# Patient Record
Sex: Female | Born: 1950
Health system: Southern US, Community
[De-identification: ages and names within clinical notes are randomized; demographics above are authoritative.]

## PROBLEM LIST (undated history)

## (undated) DIAGNOSIS — R112 Nausea with vomiting, unspecified: Secondary | ICD-10-CM

## (undated) DIAGNOSIS — Z9889 Other specified postprocedural states: Secondary | ICD-10-CM

## (undated) DIAGNOSIS — K589 Irritable bowel syndrome without diarrhea: Secondary | ICD-10-CM

## (undated) DIAGNOSIS — T8859XA Other complications of anesthesia, initial encounter: Secondary | ICD-10-CM

## (undated) DIAGNOSIS — E119 Type 2 diabetes mellitus without complications: Secondary | ICD-10-CM

## (undated) DIAGNOSIS — E039 Hypothyroidism, unspecified: Secondary | ICD-10-CM

## (undated) DIAGNOSIS — T4145XA Adverse effect of unspecified anesthetic, initial encounter: Secondary | ICD-10-CM

## (undated) DIAGNOSIS — K5790 Diverticulosis of intestine, part unspecified, without perforation or abscess without bleeding: Secondary | ICD-10-CM

## (undated) DIAGNOSIS — I10 Essential (primary) hypertension: Secondary | ICD-10-CM

## (undated) DIAGNOSIS — E669 Obesity, unspecified: Secondary | ICD-10-CM

## (undated) DIAGNOSIS — Z8489 Family history of other specified conditions: Secondary | ICD-10-CM

## (undated) HISTORY — PX: WISDOM TOOTH EXTRACTION: SHX21

## (undated) HISTORY — PX: HYSTEROSCOPY W/ ENDOMETRIAL ABLATION: SUR665

## (undated) HISTORY — PX: TUBAL LIGATION: SHX77

---
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
## 2013-12-06 ENCOUNTER — Emergency Department (HOSPITAL_COMMUNITY)
Admission: EM | Admit: 2013-12-06 | Discharge: 2013-12-06 | Disposition: A | Discharge status: Home / Self Care | Payer: 59 | Attending: Emergency Medicine | Admitting: Emergency Medicine

## 2013-12-06 ENCOUNTER — Encounter (HOSPITAL_COMMUNITY): Payer: Self-pay | Admitting: Emergency Medicine

## 2013-12-06 DIAGNOSIS — E669 Obesity, unspecified: Secondary | ICD-10-CM | POA: Insufficient documentation

## 2013-12-06 DIAGNOSIS — J029 Acute pharyngitis, unspecified: Secondary | ICD-10-CM

## 2013-12-06 DIAGNOSIS — J069 Acute upper respiratory infection, unspecified: Secondary | ICD-10-CM | POA: Insufficient documentation

## 2013-12-06 HISTORY — DX: Obesity, unspecified: E66.9

## 2013-12-06 MED ORDER — AZITHROMYCIN 250 MG PO TABS: ORAL_TABLET | ORAL | Status: DC

## 2013-12-06 MED ORDER — LIDOCAINE VISCOUS HCL 2 % MT SOLN: 5.0000 mL | Freq: Four times a day (QID) | OROMUCOSAL | Status: DC | PRN

## 2013-12-06 NOTE — ED Notes (Signed)
Pt reports sore throat x 3 weeks, no fever or chills. Airway intact.

## 2013-12-06 NOTE — ED Provider Notes (Signed)
CSN: [id number]     Arrival date & time 12/06/13  1232 History  This chart was scribed for non-physician practitioner, Margarita Mail, PA-C, working with Tanna Furry, MD by Roe Coombs, ED Scribe. This patient was seen in room TR04C/TR04C and the patient's care was started at 1:23 PM.   Chief Complaint  Patient presents with  . Sore Throat    The history is provided by the patient. No language interpreter was used.    HPI Comments: Laurie Michael is a 63 y.o. female who presents to the Emergency Department complaining of constant, moderate sore throat onset 3 weeks ago. Pain is worse with swallowing. Patient states that prior to developing a sore throat, she was having cold like symptoms to include productive cough with green sputum. She states that symptoms improved for about 1 week when she she developed throat pain and cough returned. She denies fever, chills, nausea or vomiting. She has sick contacts of her husband and son who has an ear infection. She does not take any medications on a regular basis. Patient does not smoke.  Past Medical History  Diagnosis Date  . Obesity    History reviewed. No pertinent past surgical history. History reviewed. No pertinent family history. History  Substance Use Topics  . Smoking status: Never Smoker   . Smokeless tobacco: Not on file  . Alcohol Use: No   OB History   Grav Para Term Preterm Abortions TAB SAB Ect Mult Living                 Review of Systems  Constitutional: Negative for fever and chills.  HENT: Positive for sore throat.   Respiratory: Positive for cough.   Gastrointestinal: Negative for nausea and vomiting.  Neurological: Negative for headaches.    Allergies  Review of patient's allergies indicates no known allergies.  Home Medications   Prior to Admission medications   Not on File   Triage Vitals: BP 138/94  Pulse 101  Temp(Src) 97.8 F (36.6 C) (Oral)  Resp 18  Wt 214 lb 11.2 oz (97.387 kg)  SpO2  96% Physical Exam  Constitutional: She is oriented to person, place, and time. She appears well-developed and well-nourished. No distress.  HENT:  Head: Normocephalic and atraumatic.  Mouth/Throat: Posterior oropharyngeal erythema present. No oropharyngeal exudate or posterior oropharyngeal edema.  No gross oropharyngeal swelling.  Eyes: Conjunctivae and EOM are normal.  Neck: Neck supple.  Cardiovascular: Normal rate, regular rhythm and normal heart sounds.   No murmur heard. Pulmonary/Chest: Effort normal and breath sounds normal. No respiratory distress. She has no wheezes. She has no rales.  Musculoskeletal: Normal range of motion.  Lymphadenopathy:    She has cervical adenopathy (bilateral).  Neurological: She is alert and oriented to person, place, and time.  Skin: Skin is warm and dry.  Psychiatric: She has a normal mood and affect. Her behavior is normal.    ED Course  Procedures (including critical care time) DIAGNOSTIC STUDIES: Oxygen Saturation is 96% on room air, adequate by my interpretation.    COORDINATION OF CARE: 1:26 PM- Patient informed of current plan for treatment and evaluation and agrees with plan at this time.    MDM   Final diagnoses:  URI (upper respiratory infection)    Patient presents with sore throat and recurrent cough. Oropharyngeal erythema without exudate or gross swelling on exam. Lungs clear to auscultation. No indication for imaging. Will treat for upper respiratory infection with azithromycin and viscous lidocaine as patient  had these symptoms and has a secondary recurrence after getting better. Verbalizes understanding and is agreeable with plan. Pt is hemodynamically stable & in NAD prior to dc.  I personally performed the services described in this documentation, which was scribed in my presence. The recorded information has been reviewed and considered.  Margarita Mail, PA-C 12/07/13 1819

## 2013-12-06 NOTE — Discharge Instructions (Signed)
Pharyngitis Pharyngitis is redness, pain, and swelling (inflammation) of your pharynx.  CAUSES  Pharyngitis is usually caused by infection. Most of the time, these infections are from viruses (viral) and are part of a cold. However, sometimes pharyngitis is caused by bacteria (bacterial). Pharyngitis can also be caused by allergies. Viral pharyngitis may be spread from person to person by coughing, sneezing, and personal items or utensils (cups, forks, spoons, toothbrushes). Bacterial pharyngitis may be spread from person to person by more intimate contact, such as kissing.  SIGNS AND SYMPTOMS  Symptoms of pharyngitis include:   Sore throat.   Tiredness (fatigue).   Low-grade fever.   Headache.  Joint pain and muscle aches.  Skin rashes.  Swollen lymph nodes.  Plaque-like film on throat or tonsils (often seen with bacterial pharyngitis). DIAGNOSIS  Your health care provider will ask you questions about your illness and your symptoms. Your medical history, along with a physical exam, is often all that is needed to diagnose pharyngitis. Sometimes, a rapid strep test is done. Other lab tests may also be done, depending on the suspected cause.  TREATMENT  Viral pharyngitis will usually get better in 3 4 days without the use of medicine. Bacterial pharyngitis is treated with medicines that kill germs (antibiotics).  HOME CARE INSTRUCTIONS   Drink enough water and fluids to keep your urine clear or pale yellow.   Only take over-the-counter or prescription medicines as directed by your health care provider:   If you are prescribed antibiotics, make sure you finish them even if you start to feel better.   Do not take aspirin.   Get lots of rest.   Gargle with 8 oz of salt water ( tsp of salt per 1 qt of water) as often as every 1 2 hours to soothe your throat.   Throat lozenges (if you are not at risk for choking) or sprays may be used to soothe your throat. SEEK MEDICAL  CARE IF:   You have large, tender lumps in your neck.  You have a rash.  You cough up green, yellow-brown, or bloody spit. SEEK IMMEDIATE MEDICAL CARE IF:   Your neck becomes stiff.  You drool or are unable to swallow liquids.  You vomit or are unable to keep medicines or liquids down.  You have severe pain that does not go away with the use of recommended medicines.  You have trouble breathing (not caused by a stuffy nose). MAKE SURE YOU:   Understand these instructions.  Will watch your condition.  Will get help right away if you are not doing well or get worse. Document Released: 07/31/2005 Document Revised: 05/21/2013 Document Reviewed: 04/07/2013 [facility] Patient Information 2014 [place].  Cough, Adult  A cough is a reflex that helps clear your throat and airways. It can help heal the body or may be a reaction to an irritated airway. A cough may only last 2 or 3 weeks (acute) or may last more than 8 weeks (chronic).  CAUSES Acute cough:  Viral or bacterial infections. Chronic cough:  Infections.  Allergies.  Asthma.  Post-nasal drip.  Smoking.  Heartburn or acid reflux.  Some medicines.  Chronic lung problems (COPD).  Cancer. SYMPTOMS   Cough.  Fever.  Chest pain.  Increased breathing rate.  High-pitched whistling sound when breathing (wheezing).  Colored mucus that you cough up (sputum). TREATMENT   A bacterial cough may be treated with antibiotic medicine.  A viral cough must run its course and will not  respond to antibiotics.  Your caregiver may recommend other treatments if you have a chronic cough. HOME CARE INSTRUCTIONS   Only take over-the-counter or prescription medicines for pain, discomfort, or fever as directed by your caregiver. Use cough suppressants only as directed by your caregiver.  Use a cold steam vaporizer or humidifier in your bedroom or home to help loosen secretions.  Sleep in a semi-upright position if  your cough is worse at night.  Rest as needed.  Stop smoking if you smoke. SEEK IMMEDIATE MEDICAL CARE IF:   You have pus in your sputum.  Your cough starts to worsen.  You cannot control your cough with suppressants and are losing sleep.  You begin coughing up blood.  You have difficulty breathing.  You develop pain which is getting worse or is uncontrolled with medicine.  You have a fever. MAKE SURE YOU:   Understand these instructions.  Will watch your condition.  Will get help right away if you are not doing well or get worse. Document Released: 01/27/2011 Document Revised: 10/23/2011 Document Reviewed: 01/27/2011 [facility] Patient Information 2014 [place].

## 2013-12-23 NOTE — ED Provider Notes (Signed)
Medical screening examination/treatment/procedure(s) were performed by non-physician practitioner and as supervising physician I was immediately available for consultation/collaboration.   EKG Interpretation None        Tanna Furry, MD 12/23/13 1906

## 2015-05-31 LAB — HM DIABETES EYE EXAM

## 2015-08-15 HISTORY — PX: CATARACT EXTRACTION, BILATERAL: SHX1313

## 2016-01-12 ENCOUNTER — Ambulatory Visit (INDEPENDENT_AMBULATORY_CARE_PROVIDER_SITE_OTHER): Payer: PRIVATE HEALTH INSURANCE | Admitting: Internal Medicine

## 2016-01-12 ENCOUNTER — Ambulatory Visit: Payer: PRIVATE HEALTH INSURANCE

## 2016-01-12 ENCOUNTER — Encounter: Payer: Self-pay | Admitting: Internal Medicine

## 2016-01-12 VITALS — BP 150/80 | HR 76 | Temp 98.2°F | Ht 63.0 in | Wt 203.7 lb

## 2016-01-12 DIAGNOSIS — Z7984 Long term (current) use of oral hypoglycemic drugs: Secondary | ICD-10-CM | POA: Diagnosis not present

## 2016-01-12 DIAGNOSIS — Z0000 Encounter for general adult medical examination without abnormal findings: Secondary | ICD-10-CM | POA: Insufficient documentation

## 2016-01-12 DIAGNOSIS — I10 Essential (primary) hypertension: Secondary | ICD-10-CM | POA: Diagnosis not present

## 2016-01-12 DIAGNOSIS — E1136 Type 2 diabetes mellitus with diabetic cataract: Secondary | ICD-10-CM

## 2016-01-12 DIAGNOSIS — E1165 Type 2 diabetes mellitus with hyperglycemia: Secondary | ICD-10-CM

## 2016-01-12 DIAGNOSIS — E119 Type 2 diabetes mellitus without complications: Secondary | ICD-10-CM | POA: Insufficient documentation

## 2016-01-12 DIAGNOSIS — K580 Irritable bowel syndrome with diarrhea: Secondary | ICD-10-CM | POA: Diagnosis not present

## 2016-01-12 DIAGNOSIS — Z87891 Personal history of nicotine dependence: Secondary | ICD-10-CM

## 2016-01-12 DIAGNOSIS — R739 Hyperglycemia, unspecified: Secondary | ICD-10-CM

## 2016-01-12 DIAGNOSIS — H269 Unspecified cataract: Secondary | ICD-10-CM | POA: Insufficient documentation

## 2016-01-12 DIAGNOSIS — R197 Diarrhea, unspecified: Secondary | ICD-10-CM | POA: Insufficient documentation

## 2016-01-12 LAB — POCT GLYCOSYLATED HEMOGLOBIN (HGB A1C): HEMOGLOBIN A1C: 8.2

## 2016-01-12 LAB — GLUCOSE, CAPILLARY: GLUCOSE-CAPILLARY: 192 mg/dL — AB (ref 65–99)

## 2016-01-12 MED ORDER — HYDROCHLOROTHIAZIDE 12.5 MG PO CAPS: 12.5000 mg | ORAL_CAPSULE | Freq: Every day | ORAL | Status: DC

## 2016-01-12 MED ORDER — DICYCLOMINE HCL 20 MG PO TABS
20.0000 mg | ORAL_TABLET | Freq: Three times a day (TID) | ORAL | Status: DC | PRN
Start: 2016-01-12 — End: 2017-07-17

## 2016-01-12 MED ORDER — METFORMIN HCL 500 MG PO TABS
500.0000 mg | ORAL_TABLET | Freq: Two times a day (BID) | ORAL | Status: DC
Start: 2016-01-12 — End: 2016-02-11

## 2016-01-12 NOTE — Assessment & Plan Note (Signed)
A: Readings at home an in office today c/w essential HTN.   P:  Start HCTZ 12.5 mg daily F/u 1 month BMET in 1 month

## 2016-01-12 NOTE — Progress Notes (Signed)
   Subjective:    Patient ID: Laurie Michael, female    DOB: 04-25-1951, 65 y.o.   MRN: [id number]  HPI  [name] is a 65 year old woman with a PMH of obesity, prior tobacco abuse, and cataracts who comes to the clinic to discuss blurry vision. She's had blurry vision since October 2016 when she was diagnosed with cataracts at Dr. Zenia Resides ophthalmology office. She says she does not feel her vision is narrowing or that she is going blind. She denies any headaches.   The patient also describes polyphagia and polydipsia, denying polyuria. She took her blood glucose first thing in the morning before eating and it was 170. She used her husband's glucometer. She also uses her husband's blood pressure cuff and says she is regularly in the 150s "for the top number."  The patient also complains of longstanding stool urgency and diarrhea, feeling as though she needs to be near the bathroom "just in case" after dinner. She denies any blood in her stool. She refers to this as "IBS."  She is a former smoker of >20 years, quitting several years ago. Her father his deceased from an MI. Her mother has diabetes.  Review of Systems  Constitutional: Negative for fever, fatigue and unexpected weight change.  HENT: Negative for congestion and sore throat.   Eyes: Positive for visual disturbance. Negative for photophobia, redness and itching.  Respiratory: Negative for cough and shortness of breath.   Cardiovascular: Negative for chest pain and palpitations.  Gastrointestinal: Negative for abdominal pain and diarrhea.  Endocrine: Positive for polydipsia and polyphagia. Negative for polyuria.  Genitourinary: Negative for urgency and frequency.  Musculoskeletal: Negative for back pain and arthralgias.  Skin: Negative for rash and wound.  Neurological: Negative for dizziness, light-headedness and headaches.  Psychiatric/Behavioral: Negative for dysphoric mood. The patient is not nervous/anxious.          Objective:   Physical Exam  Constitutional: No distress.  HENT:  Mouth/Throat: Oropharynx is clear and moist. No oropharyngeal exudate.  Eyes: Conjunctivae are normal. No scleral icterus.  Cataracts present.  Neck: Neck supple.  Cardiovascular: Normal rate, regular rhythm and normal heart sounds.   Pulmonary/Chest: Effort normal and breath sounds normal. No respiratory distress. She has no wheezes.  Abdominal: Soft. Bowel sounds are normal. She exhibits no distension. There is no tenderness.  Obese  Musculoskeletal: She exhibits no edema or tenderness.  Lymphadenopathy:    She has no cervical adenopathy.  Neurological: She is alert. She has normal reflexes.  Skin: Skin is warm and dry. She is not diaphoretic.  Psychiatric: She has a normal mood and affect. Her behavior is normal.  Vitals reviewed.         Assessment & Plan:   Please see problem based assessment and plan for details.

## 2016-01-12 NOTE — Assessment & Plan Note (Addendum)
A: Cataracts on exam today. Blurry vision is likely attributable to this rather than diabetes, which is relative mild.  P:  Ophthalmology referral

## 2016-01-12 NOTE — Assessment & Plan Note (Signed)
Stool cards provided today

## 2016-01-12 NOTE — Assessment & Plan Note (Signed)
A: While technically a diagnosis of exclusion, her years of relatively mild symptoms without any clear extra-diarrheal manifestations or bloody stool fits the IBS diagnosis well.  P: Start Dicyclomine 20 mg TID prn stool urgency

## 2016-01-12 NOTE — Progress Notes (Signed)
Medicine attending: Medical history, presenting problems, physical findings, and medications, reviewed with resident physician Dr  Jeremy Ford on the day of the patient visit and I concur with his evaluation and management plan. 

## 2016-01-12 NOTE — Patient Instructions (Signed)
Laurie Michael,  It was a joy to meet you today.  I am sorry to report that you have diabetes. I think you can do a good job beating this just like you beat smoking. I have made a referral to our diabetes educator. I have also started you on metformin 500 mg twice daily. One side effect can be stomach discomfort, but this should go away. You may lose weight on this medication.   You also have high blood pressure. We are starting hydrochlorothiazide (HCTZ) 12.5 mg daily for this.  For your IBS, we are starting dicyclomine (Bentyl) 20 mg three times daily as needed for your symptoms. If this is too expensive, you can try Immodium (loperamide) 2 mg 45 minutes before meals.  For you blurry vision, this is most likely due to cataracts. We have made a referral to an eye doctor.  Have a lovely day!  We'll see you in a month.  Alogliptin; Metformin oral tablets What is this medicine? ALOGLIPTIN; METFORMIN (al oh GLIP tin; met FOR min) is a combination of 2 medicines used to treat type 2 diabetes. This medicine lowers blood sugar. Treatment is combined with a balanced diet and exercise. This medicine may be used for other purposes; ask your health care provider or pharmacist if you have questions. What should I tell my health care provider before I take this medicine? They need to know if you have any of these conditions: -anemia -become easily dehydrated -diabetic ketoacidosis -heart disease -heart failure -history of alcohol abuse problem -if you often drink alcohol -kidney disease -liver disease -low levels of vitamin B12 in the blood -older than 80 years -pancreatitis -polycystic ovary syndrome -previous swelling of the tongue, face, or lips with difficulty breathing, difficulty swallowing, hoarseness, or tightening of the throat -serious infection or injury -thyroid disease -type 1 diabetes -undergoing surgery or certain procedures with injectable contrast agents -an unusual or  allergic reaction to alogliptin, metformin, other medicines, foods, dyes, or preservatives -pregnant or trying to get pregnant -breast-feeding How should I use this medicine? Take this medicine by mouth with a glass of water. Take this medicine with food. Do not cut this medicine. Follow the directions on the prescription label. Take your doses at regular intervals. Do not take your medicine more often than directed. Do not stop taking except on your doctor's advice. Talk to your pediatrician regarding the use of this medicine in children. Special care may be needed. Overdosage: If you think you have taken too much of this medicine contact a poison control center or emergency room at once. NOTE: This medicine is only for you. Do not share this medicine with others. What if I miss a dose? If you miss a dose, take it as soon as you can. If it is almost time for your next dose, take only that dose. Do not take double or extra doses. What may interact with this medicine? Do not take this medicine with any of the following medications: -certain contrast medicines given before X-rays, CT scans, MRI, or other procedures -dofetilide -gatifloxacin This medicine may also interact with the following medications: -acetazolamide -alcohol -amiloride -certain antiviral medicines for HIV infection or hepatitis -cimetidine -crizotinib -digoxin -diuretics -female hormones, like estrogens or progestins and birth control pills -glycopyrrolate -isoniazid -lamotrigine -medicines for blood pressure, heart disease, irregular heart beat -memantine -methazolamide -midodrine -morphine -nicotinic acid -phenothiazines like chlorpromazine, mesoridazine, prochlorperazine, thioridazine -phenytoin -procainamide -propantheline -quinidine -quinine -ranitidine -ranolazine -steroid medicines like prednisone or cortisone -stimulant medicines for  attention disorders, weight loss, or to stay awake -thyroid  medicines -topiramate -trimethoprim -trospium -vancomycin -vandetanib -zonisamide This list may not describe all possible interactions. Give your health care provider a list of all the medicines, herbs, non-prescription drugs, or dietary supplements you use. Also tell them if you smoke, drink alcohol, or use illegal drugs. Some items may interact with your medicine. What should I watch for while using this medicine? Visit your doctor or health care professional for regular checks on your progress. A test called the HbA1C (A1C) will be monitored. This is a simple blood test. It measures your blood sugar control over the last 2 to 3 months. You will receive this test every 3 to 6 months. Learn how to check your blood sugar. Learn the symptoms of low and high blood sugar and how to manage them. Always carry a quick-source of sugar with you in case you have symptoms of low blood sugar. Examples include hard sugar candy or glucose tablets. Make sure others know that you can choke if you eat or drink when you develop serious symptoms of low blood sugar, such as seizures or unconsciousness. They must get medical help at once. Tell your doctor or health care professional if you have high blood sugar. You might need to change the dose of your medicine. If you are sick or exercising more than usual, you might need to change the dose of your medicine. Do not skip meals. Ask your doctor or health care professional if you should avoid alcohol. Many nonprescription cough and cold products contain sugar or alcohol. These can affect blood sugar. This medicine may cause ovulation in premenopausal women who do not have regular monthly periods. This may increase your chances of becoming pregnant. You should not take this medicine if you become pregnant or think you may be pregnant. Talk with your doctor or health care professional about your birth control options while taking this medicine. Contact your doctor or health  care professional right away if think you are pregnant. If you are going to need surgery, a MRI, CT scan, or other procedure, tell your doctor that you are taking this medicine. You may need to stop taking this medicine before the procedure. Wear a medical ID bracelet or chain, and carry a card that describes your disease and details of your medicine and dosage times. What side effects may I notice from receiving this medicine? Side effects that you should report to your doctor or health care professional as soon as possible: -allergic reactions like skin rash, itching or hives, swelling of the face, lips, or tongue -breathing problems -dark urine -general ill feeling or flu-like symptoms -joint pain -light-colored stools -loss of appetite -muscle pain -nausea, vomiting -right upper belly pain -signs and symptoms of low blood sugar such as feeling anxious, confusion, dizziness, increased hunger, unusually weak or tired, sweating, shakiness, cold, irritable, headache, blurred vision, fast heartbeat, loss of consciousness -slow or irregular heartbeat -unusual stomach upset or pain -yellowing of the eyes or skin Side effects that usually do not require medical attention (Report these to your doctor or health care professional if they continue or are bothersome.): -diarrhea -headache -heartburn -metallic taste in the mouth -stomach gas, upset -stuffy or runny nose This list may not describe all possible side effects. Call your doctor for medical advice about side effects. You may report side effects to FDA at 1-800-FDA-1088. Where should I keep my medicine? Keep out of the reach of children. Store at room temperature  between 20 and 25 degrees C (68 and 77 degrees F). Throw away any unused medicine after the expiration date. NOTE: This sheet is a summary. It may not cover all possible information. If you have questions about this medicine, talk to your doctor, pharmacist, or health care  provider.    2016, [employer]. (2014-11-19 10:33:18)

## 2016-01-12 NOTE — Assessment & Plan Note (Signed)
A: A1c of 8.2 fits diagnosis of T2DM. She has already initiated some lifestyle changes with minimal effect.   P:  Metformin 500 mg BID Referral to dietician

## 2016-01-13 LAB — HM DIABETES EYE EXAM

## 2016-01-17 ENCOUNTER — Encounter: Payer: Self-pay | Admitting: *Deleted

## 2016-01-25 ENCOUNTER — Ambulatory Visit: Payer: Self-pay

## 2016-01-26 ENCOUNTER — Ambulatory Visit: Payer: Self-pay

## 2016-02-11 ENCOUNTER — Ambulatory Visit (INDEPENDENT_AMBULATORY_CARE_PROVIDER_SITE_OTHER): Payer: Self-pay | Admitting: Dietician

## 2016-02-11 ENCOUNTER — Ambulatory Visit (INDEPENDENT_AMBULATORY_CARE_PROVIDER_SITE_OTHER): Payer: Self-pay | Admitting: Internal Medicine

## 2016-02-11 ENCOUNTER — Encounter: Payer: Self-pay | Admitting: Dietician

## 2016-02-11 ENCOUNTER — Encounter: Payer: Self-pay | Admitting: Internal Medicine

## 2016-02-11 VITALS — BP 127/76 | HR 79 | Temp 97.7°F | Ht 63.0 in | Wt 200.2 lb

## 2016-02-11 DIAGNOSIS — Z7984 Long term (current) use of oral hypoglycemic drugs: Secondary | ICD-10-CM

## 2016-02-11 DIAGNOSIS — E119 Type 2 diabetes mellitus without complications: Secondary | ICD-10-CM

## 2016-02-11 DIAGNOSIS — E1165 Type 2 diabetes mellitus with hyperglycemia: Principal | ICD-10-CM

## 2016-02-11 DIAGNOSIS — K529 Noninfective gastroenteritis and colitis, unspecified: Secondary | ICD-10-CM

## 2016-02-11 DIAGNOSIS — R197 Diarrhea, unspecified: Secondary | ICD-10-CM

## 2016-02-11 DIAGNOSIS — E1139 Type 2 diabetes mellitus with other diabetic ophthalmic complication: Secondary | ICD-10-CM

## 2016-02-11 DIAGNOSIS — Z713 Dietary counseling and surveillance: Secondary | ICD-10-CM

## 2016-02-11 DIAGNOSIS — E1136 Type 2 diabetes mellitus with diabetic cataract: Secondary | ICD-10-CM

## 2016-02-11 DIAGNOSIS — I10 Essential (primary) hypertension: Secondary | ICD-10-CM

## 2016-02-11 DIAGNOSIS — IMO0002 Reserved for concepts with insufficient information to code with codable children: Secondary | ICD-10-CM

## 2016-02-11 LAB — GLUCOSE, CAPILLARY: Glucose-Capillary: 113 mg/dL — ABNORMAL HIGH (ref 65–99)

## 2016-02-11 MED ORDER — METFORMIN HCL 1000 MG PO TABS: 1000.0000 mg | ORAL_TABLET | Freq: Two times a day (BID) | ORAL | Status: DC

## 2016-02-11 NOTE — Patient Instructions (Signed)
Dear Mr. & Mrs. Tuckey,  I would like you to schedule a follow up appointment in about 2-3 months to see how you are doing with planning your meals. Or sooner if needed.   Of course, call anytime you have questions or concerns   Sincerely,  Debera Lat [phone]

## 2016-02-11 NOTE — Assessment & Plan Note (Signed)
BP Readings from Last 3 Encounters:  02/11/16 127/76  01/12/16 150/80  12/06/13 131/89    No results found for: NA, K, CREATININE  Assessment: Blood pressure control:  Controlled Comments: on 12.5mg  daily of HCTZ  Plan: Medications:  continue current medications  Bmet, next visit.

## 2016-02-11 NOTE — Patient Instructions (Addendum)
You will need to get a Colonoscopy, PAP smear, and stool test, when you get your [facility] card.   Please take your medication everyday. Since you are doing well with the metformin, we will increase the dose to 1000mg  two times a day. We have sent in the new prescription.   If you have any problems with affordability, let us know.   It was nice seeing you today.

## 2016-02-11 NOTE — Assessment & Plan Note (Signed)
She is been referred to [facility], as she does not have the [facility] card yet. Appointment with Dr Katy Fitch cancelled.

## 2016-02-11 NOTE — Assessment & Plan Note (Addendum)
Chronic diarrhea for many years. She has never had a colonoscopy, or evaluation for this diarrhea. Diarhea occurs after every meal. No blood. No vomiting. Weight down 14 Lbs since 2015.  She was prescribed dicyclomine but she did not take it, as she was worried she will have cramps.   Differentials- Secretory vs Osmotic diarrhea, Microscopic colitis, or IBS.  Plan- Would recommend stool workup- Giardia, Stool Na and K, and check Electrolytes and Cr next visit, with [facility] card - She is also due for a colonoscopy anyway, Place refferal once she gets the [facility] card.

## 2016-02-11 NOTE — Assessment & Plan Note (Addendum)
Tolerating 500mg  BID of metformin. There has been no change in the frequency of her diarrhea. No nausea or vomiting  Plan- Increase metformin to 1000mg  BID - Urine micro, foot exam, lipid panel once she gets [facility] card.

## 2016-02-11 NOTE — Progress Notes (Signed)
Diabetes Self-Management Education  Visit Type: First/Initial  Appt. Start Time: 1530 Appt. End Time: [id number]  02/11/2016  [name], identified by name and date of birth, is a 65 y.o. female with a diagnosis of Diabetes: Type 2.   ASSESSMENT  She is here with her husband who also has diabetes.He is very supportive. He wants them to adopt healthier meals. She wants him to exercsie.  She exercises on treadmill a few days a week. The both quit smoking in the recent past  BMI.-35 class 1 obesity with central abdominal obesity      Diabetes Self-Management Education - 02/11/16 1600    Visit Information   Visit Type First/Initial   Initial Visit   Diabetes Type Type 2   Are you currently following a meal plan? No   Are you taking your medications as prescribed? Yes   Date Diagnosed --  2017   Health Coping   How would you rate your overall health? Good   Psychosocial Assessment   Patient Belief/Attitude about Diabetes Motivated to manage diabetes   Self-care barriers Lack of material resources   Self-management support Doctor's office;Family;CDE visits   Other persons present Spouse/SO   Patient Concerns Nutrition/Meal planning   Special Needs None   Preferred Learning Style No preference indicated   Learning Readiness Ready   How often do you need to have someone help you when you read instructions, pamphlets, or other written materials from your doctor or pharmacy? 2 - Rarely   Pre-Education Assessment   Patient understands the diabetes disease and treatment process. Demonstrates understanding / competency   Patient understands incorporating nutritional management into lifestyle. Needs Instruction   Patient undertands incorporating physical activity into lifestyle. Demonstrates understanding / competency   Patient understands using medications safely. Demonstrates understanding / competency   Patient understands how to develop strategies to promote health/change  behavior. Needs Instruction   Complications   Last HgB A1C per patient/outside source 8.2 %   Dietary Intake   Lunch salad, fried and grilled chicken   Dinner regular soda   Snack (evening) peanuts   Patient Education   Previous Diabetes Education Yes (at his doctor's office)   Nutrition management  Role of diet in the treatment of diabetes and the relationship between the three main macronutrients and blood glucose level;Food label reading, portion sizes and measuring food.;Carbohydrate counting   Personal strategies to promote health Helped patient develop diabetes management plan for better nutrition and meal plan with 45-60 grams carb/meal   Individualized Goals (developed by patient)   Nutrition Follow meal plan discussed   Outcomes   Expected Outcomes Demonstrated interest in learning. Expect positive outcomes   Future DMSE 3-4 months   Program Status Completed      Individualized Plan for Diabetes Self-Management Training:   Learning Objective:  Patient will have a greater understanding of diabetes self-management. Patient education plan: nutrition meal planning and developing habits to promote health   Plan:   There are no Patient Instructions on file for this visit.  Expected Outcomes:  Demonstrated interest in learning. Expect positive outcomes Education material provided: Living Well with Diabetes, My Plate and Snack sheet If problems or questions, patient to contact team via:  Phone  Future DSME appointment: 3-4 months

## 2016-02-11 NOTE — Progress Notes (Signed)
Patient ID: Laurie Michael, female   DOB: August 25, 1950, 65 y.o.   MRN: [id number]   CC:  HPI: [name] is a 65 y.o. with PMH listed below. She came in today to follow up on her DM and HTN. She was started on new meds- 01/12/16. She has no complainst today.  Past Medical History  Diagnosis Date  . Obesity    Review of Systems: CONSTITUTIONAL- No Fever, or change in appetite. SKIN- No Rash, colour changes or itching. HEAD- No Headache or dizziness. Mouth/throat- No Sorethroat, dentures, or bleeding gums. RESPIRATORY- No Cough or SOB. CARDIAC- No Palpitations, or chest pain. GI- has chronic diarrhea, no abd pain. URINARY- No Frequency, or dysuria.  Physical Exam: Filed Vitals:   02/11/16 1454  BP: 127/76  Pulse: 79  Temp: 97.7 F (36.5 C)  TempSrc: Oral  Height: 5\' 3"  (1.6 m)  Weight: 200 lb 3.2 oz (90.81 kg)  SpO2: 98%   GENERAL- alert, co-operative, appears as stated age, not in any distress. HEENT- Atraumatic, normocephalic, PERRL,  CARDIAC- RRR, no murmurs, rubs or gallops. RESP- Moving equal volumes of air, and no wheezes or crackles. ABDOMEN- Soft, nontender, obese, no guarding or rebound, bowel sounds present. NEURO- No obvious Cr N abnormality, strenght upper and lower extremities intact, Gait- Normal. EXTREMITIES- pulse 2+, symmetric, no pedal edema. SKIN- Warm, dry, No rash or lesion. PSYCH- Normal mood and affect, appropriate thought content and speech.  Assessment & Plan:  See encounters tab for problem based medical decision making. Patient discussed with Dr. Lynnae January

## 2016-02-14 NOTE — Progress Notes (Signed)
Internal Medicine Clinic Attending  Case discussed with Dr. Rosalva Ferron soon after the resident saw the patient.  We reviewed the resident's history and exam and pertinent patient test results.  I agree with the assessment, diagnosis, and plan of care documented in the resident's note.

## 2016-02-28 ENCOUNTER — Ambulatory Visit: Payer: Self-pay

## 2016-02-29 ENCOUNTER — Ambulatory Visit (INDEPENDENT_AMBULATORY_CARE_PROVIDER_SITE_OTHER): Payer: Self-pay | Admitting: Internal Medicine

## 2016-02-29 ENCOUNTER — Encounter: Payer: Self-pay | Admitting: Internal Medicine

## 2016-02-29 VITALS — BP 141/95 | HR 77 | Temp 98.0°F | Ht 63.0 in | Wt 197.4 lb

## 2016-02-29 DIAGNOSIS — Z7984 Long term (current) use of oral hypoglycemic drugs: Secondary | ICD-10-CM

## 2016-02-29 DIAGNOSIS — R197 Diarrhea, unspecified: Secondary | ICD-10-CM

## 2016-02-29 DIAGNOSIS — Z0000 Encounter for general adult medical examination without abnormal findings: Secondary | ICD-10-CM

## 2016-02-29 DIAGNOSIS — H269 Unspecified cataract: Secondary | ICD-10-CM

## 2016-02-29 DIAGNOSIS — E1139 Type 2 diabetes mellitus with other diabetic ophthalmic complication: Secondary | ICD-10-CM

## 2016-02-29 DIAGNOSIS — E1136 Type 2 diabetes mellitus with diabetic cataract: Secondary | ICD-10-CM

## 2016-02-29 DIAGNOSIS — I10 Essential (primary) hypertension: Secondary | ICD-10-CM

## 2016-02-29 DIAGNOSIS — K529 Noninfective gastroenteritis and colitis, unspecified: Secondary | ICD-10-CM

## 2016-02-29 DIAGNOSIS — E1165 Type 2 diabetes mellitus with hyperglycemia: Secondary | ICD-10-CM

## 2016-02-29 DIAGNOSIS — E119 Type 2 diabetes mellitus without complications: Secondary | ICD-10-CM

## 2016-02-29 DIAGNOSIS — IMO0002 Reserved for concepts with insufficient information to code with codable children: Secondary | ICD-10-CM

## 2016-02-29 LAB — BASIC METABOLIC PANEL
ANION GAP: 12 (ref 5–15)
BUN: 11 mg/dL (ref 6–20)
CALCIUM: 9.8 mg/dL (ref 8.9–10.3)
CO2: 28 mmol/L (ref 22–32)
CREATININE: 0.79 mg/dL (ref 0.44–1.00)
Chloride: 100 mmol/L — ABNORMAL LOW (ref 101–111)
GFR calc Af Amer: >60 mL/min (ref >60)
Glucose, Bld: 85 mg/dL (ref 65–99)
Potassium: 3.7 mmol/L (ref 3.5–5.1)
SODIUM: 140 mmol/L (ref 135–145)

## 2016-02-29 MED ORDER — GLIPIZIDE 5 MG PO TABS: 5.0000 mg | ORAL_TABLET | Freq: Every day | ORAL | Status: DC

## 2016-02-29 MED ORDER — METFORMIN HCL 1000 MG PO TABS: 1000.0000 mg | ORAL_TABLET | Freq: Every day | ORAL | Status: DC

## 2016-02-29 NOTE — Assessment & Plan Note (Signed)
Patient continues to have chronic diarrhea. She reports not taking Bentyl due to hearing that cramping is a side effect. We discussed the importance of a screening colonoscopy and she agreed to schedule it at next visit.

## 2016-02-29 NOTE — Progress Notes (Signed)
   CC: follow up of diabetes, htn and cataracts.  HPI:  [name] is a very pleasant 65 y.o. female who presents for follow up evaluation of diabetes, HTN and cataracts. The patient reports taking only 1 metformin daily due diarrhea when she takes 2. She reports her fasting blood sugars have been in the 130's. She also complains of worsening blurry vision due to her cataracts. She was unable to have cataract surgery previously because she did not have insurance, however she has an orange card now and wishes to proceed.    Past Medical History  Diagnosis Date  . Obesity     Review of Systems:   Review of Systems  Constitutional: Negative for fever and chills.  Eyes: Positive for blurred vision and double vision. Negative for pain.  Respiratory: Negative for cough and shortness of breath.   Cardiovascular: Negative for chest pain, palpitations and leg swelling.  Gastrointestinal: Positive for diarrhea. Negative for nausea, vomiting and blood in stool.  Genitourinary: Negative for dysuria, urgency and frequency.  Skin: Negative for rash.  Neurological: Negative for dizziness, tingling, sensory change and weakness.     Physical Exam:  Filed Vitals:   02/29/16 1510  BP: 141/95  Pulse: 77  Temp: 98 F (36.7 C)  TempSrc: Oral  Height: 5\' 3"  (1.6 m)  Weight: 197 lb 6.4 oz (89.54 kg)  SpO2: 99%    Physical Exam  Constitutional: She is well-developed, well-nourished, and in no distress.  HENT:  Head: Normocephalic and atraumatic.  Eyes:  BL cataracts  Neck: Carotid bruit is not present.  Cardiovascular: Normal rate and regular rhythm.   Pulmonary/Chest: Effort normal and breath sounds normal.  Abdominal: Soft. Bowel sounds are normal. There is no tenderness.  Skin: Skin is warm and dry.     Assessment & Plan:   See encounters tab for problem based medical decision making.   Patient seen with Dr. Daryll Drown

## 2016-02-29 NOTE — Assessment & Plan Note (Signed)
Patient reports she only wishes to take 1 metformin due to side effects of taking 2. Glipizide 5mg  added to current regimen. Patient instructed to check blood sugar during first few weeks and to call if any problems with medication arise. We will check HbA1c at next months follow up.

## 2016-02-29 NOTE — Assessment & Plan Note (Signed)
Discussed the importance of screening procedures with patient. She reports understanding and agreed to scheduling a PAP, colo and mam at next visit.

## 2016-02-29 NOTE — Assessment & Plan Note (Signed)
Patient complains of worsening blurry vision over the past month. Referred to ophthalmology for cataract surgery. Patient does not drive at this time.

## 2016-02-29 NOTE — Assessment & Plan Note (Signed)
Patients BP elevated in office at 141/95 but resolved on manual recheck. Patient reports compliance with current medications.

## 2016-02-29 NOTE — Patient Instructions (Addendum)
It was a pleasure meeting you and getting to know you today!!  1. Today we talked about your diabetes. We will decrease your Metformin to once a day due to side effects. We are adding a new once daily medication called Glipizide. Please check your blood sugar at home after 1 week of use.  2. Today we also discussed your hypertension. Please continue taking medications as directed.  3. We talked about your cataracts today. I will refer you to an ophthalmologist.  4. I've ordered several labs today including a Lipid panel (to check for high cholesterol), BMP (to check your kidney function) and also a urine test. I will call you if any results are abnormal. 5. As we discussed, you are due for several screening exams to be scheduled at your next appointment.   Glipizide tablets What is this medicine? GLIPIZIDE (GLIP i zide) helps to treat type 2 diabetes. Treatment is combined with diet and exercise. The medicine helps your body to use insulin better. This medicine may be used for other purposes; ask your health care provider or pharmacist if you have questions. What should I tell my health care provider before I take this medicine? They need to know if you have any of these conditions: -diabetic ketoacidosis -glucose-6-phosphate dehydrogenase deficiency -heart disease -kidney disease -liver disease -porphyria -severe infection or injury -thyroid disease -an unusual or allergic reaction to glipizide, sulfa drugs, other medicines, foods, dyes, or preservatives -pregnant or trying to get pregnant -breast-feeding How should I use this medicine? Take this medicine by mouth. Swallow with a drink of water. Do not take with food. Take it 30 minutes before a meal. Follow the directions on the prescription label. If you take this medicine once a day, take it 30 minutes before breakfast. Take your doses at the same time each day. Do not take more often than directed. Talk to your pediatrician regarding  the use of this medicine in children. Special care may be needed. Elderly patients over 55 years old may have a stronger reaction and need a smaller dose. Overdosage: If you think you have taken too much of this medicine contact a poison control center or emergency room at once. NOTE: This medicine is only for you. Do not share this medicine with others. What if I miss a dose? If you miss a dose, take it as soon as you can. If it is almost time for your next dose, take only that dose. Do not take double or extra doses. What may interact with this medicine? -bosentan -chloramphenicol -cisapride -clarithromycin -medicines for fungal or yeast infections -metoclopramide -probenecid -warfarin Many medications may cause an increase or decrease in blood sugar, these include: -alcohol containing beverages -aspirin and aspirin-like drugs -chloramphenicol -chromium -diuretics -female hormones, like estrogens or progestins and birth control pills -heart medicines -isoniazid -female hormones or anabolic steroids -medicines for weight loss -medicines for allergies, asthma, cold, or cough -medicines for mental problems -medicines called MAO Inhibitors like Nardil, Parnate, Marplan, Eldepryl -niacin -NSAIDs, medicines for pain and inflammation, like ibuprofen or naproxen -pentamidine -phenytoin -probenecid -quinolone antibiotics like ciprofloxacin, levofloxacin, ofloxacin -some herbal dietary supplements -steroid medicines like prednisone or cortisone -thyroid medicine This list may not describe all possible interactions. Give your health care provider a list of all the medicines, herbs, non-prescription drugs, or dietary supplements you use. Also tell them if you smoke, drink alcohol, or use illegal drugs. Some items may interact with your medicine. What should I watch for while using this medicine?  Visit your doctor or health care professional for regular checks on your progress. A test  called the HbA1C (A1C) will be monitored. This is a simple blood test. It measures your blood sugar control over the last 2 to 3 months. You will receive this test every 3 to 6 months. Learn how to check your blood sugar. Learn the symptoms of low and high blood sugar and how to manage them. Always carry a quick-source of sugar with you in case you have symptoms of low blood sugar. Examples include hard sugar candy or glucose tablets. Make sure others know that you can choke if you eat or drink when you develop serious symptoms of low blood sugar, such as seizures or unconsciousness. They must get medical help at once. Tell your doctor or health care professional if you have high blood sugar. You might need to change the dose of your medicine. If you are sick or exercising more than usual, you might need to change the dose of your medicine. Do not skip meals. Ask your doctor or health care professional if you should avoid alcohol. Many nonprescription cough and cold products contain sugar or alcohol. These can affect blood sugar. This medicine can make you more sensitive to the sun. Keep out of the sun. If you cannot avoid being in the sun, wear protective clothing and use sunscreen. Do not use sun lamps or tanning beds/booths. Wear a medical ID bracelet or chain, and carry a card that describes your disease and details of your medicine and dosage times. What side effects may I notice from receiving this medicine? Side effects that you should report to your doctor or health care professional as soon as possible: -allergic reactions like skin rash, itching or hives, swelling of the face, lips, or tongue -breathing problems -dark urine -fever, chills, sore throat -signs and symptoms of low blood sugar such as feeling anxious, confusion, dizziness, increased hunger, unusually weak or tired, sweating, shakiness, cold, irritable, headache, blurred vision, fast heartbeat, loss of consciousness -unusual  bleeding or bruising -yellowing of the eyes or skin Side effects that usually do not require medical attention (report to your doctor or health care professional if they continue or are bothersome): -dizziness -headache -heartburn -stomach gas This list may not describe all possible side effects. Call your doctor for medical advice about side effects. You may report side effects to FDA at 1-800-FDA-1088. Where should I keep my medicine? Keep out of the reach of children. Store at room temperature below 30 degrees C (86 degrees F). Throw away any unused medicine after the expiration date. NOTE: This sheet is a summary. It may not cover all possible information. If you have questions about this medicine, talk to your doctor, pharmacist, or health care provider.    2016, [employer]vier/Gold Standard. (2012-11-13 14:42:46)

## 2016-03-01 LAB — LIPID PANEL
Chol/HDL Ratio: 6.2 ratio units — ABNORMAL HIGH (ref 0.0–4.4)
Cholesterol, Total: 241 mg/dL — ABNORMAL HIGH (ref 100–199)
HDL: 39 mg/dL — ABNORMAL LOW (ref >39)
LDL CALC: 128 mg/dL — AB (ref 0–99)
Triglycerides: 369 mg/dL — ABNORMAL HIGH (ref 0–149)
VLDL Cholesterol Cal: 74 mg/dL — ABNORMAL HIGH (ref 5–40)

## 2016-03-01 LAB — MICROALBUMIN / CREATININE URINE RATIO
Creatinine, Urine: 72 mg/dL
MICROALB/CREAT RATIO: 4.3 mg/g{creat} (ref 0.0–30.0)
Microalbumin, Urine: 3.1 ug/mL

## 2016-03-02 ENCOUNTER — Other Ambulatory Visit: Payer: Self-pay | Admitting: Internal Medicine

## 2016-03-02 NOTE — Progress Notes (Signed)
Internal Medicine Clinic Attending  I saw and evaluated the patient.  I personally confirmed the key portions of the history and exam documented by Dr. Molt and I reviewed pertinent patient test results.  The assessment, diagnosis, and plan were formulated together and I agree with the documentation in the resident's note. 

## 2016-04-04 ENCOUNTER — Encounter: Payer: Self-pay | Admitting: Internal Medicine

## 2016-04-19 DIAGNOSIS — Z9849 Cataract extraction status, unspecified eye: Secondary | ICD-10-CM | POA: Insufficient documentation

## 2016-05-01 ENCOUNTER — Telehealth: Payer: Self-pay | Admitting: Internal Medicine

## 2016-05-01 NOTE — Telephone Encounter (Signed)
APT. REMINDER CALL, LMTCB °

## 2016-05-02 ENCOUNTER — Encounter: Payer: Self-pay | Admitting: *Deleted

## 2016-05-02 ENCOUNTER — Ambulatory Visit (INDEPENDENT_AMBULATORY_CARE_PROVIDER_SITE_OTHER): Payer: Self-pay | Admitting: Internal Medicine

## 2016-05-02 ENCOUNTER — Encounter: Payer: Self-pay | Admitting: Internal Medicine

## 2016-05-02 VITALS — BP 147/73 | HR 64 | Temp 97.6°F | Resp 20 | Wt 196.0 lb

## 2016-05-02 DIAGNOSIS — Z87891 Personal history of nicotine dependence: Secondary | ICD-10-CM

## 2016-05-02 DIAGNOSIS — Z1211 Encounter for screening for malignant neoplasm of colon: Secondary | ICD-10-CM

## 2016-05-02 DIAGNOSIS — I10 Essential (primary) hypertension: Secondary | ICD-10-CM

## 2016-05-02 DIAGNOSIS — Z7984 Long term (current) use of oral hypoglycemic drugs: Secondary | ICD-10-CM

## 2016-05-02 DIAGNOSIS — Z23 Encounter for immunization: Secondary | ICD-10-CM

## 2016-05-02 DIAGNOSIS — Z1239 Encounter for other screening for malignant neoplasm of breast: Secondary | ICD-10-CM

## 2016-05-02 DIAGNOSIS — E1169 Type 2 diabetes mellitus with other specified complication: Secondary | ICD-10-CM

## 2016-05-02 DIAGNOSIS — E785 Hyperlipidemia, unspecified: Secondary | ICD-10-CM | POA: Insufficient documentation

## 2016-05-02 DIAGNOSIS — E1100 Type 2 diabetes mellitus with hyperosmolarity without nonketotic hyperglycemic-hyperosmolar coma (NKHHC): Secondary | ICD-10-CM

## 2016-05-02 DIAGNOSIS — E1136 Type 2 diabetes mellitus with diabetic cataract: Secondary | ICD-10-CM

## 2016-05-02 DIAGNOSIS — H269 Unspecified cataract: Secondary | ICD-10-CM

## 2016-05-02 DIAGNOSIS — E784 Other hyperlipidemia: Secondary | ICD-10-CM

## 2016-05-02 DIAGNOSIS — E119 Type 2 diabetes mellitus without complications: Secondary | ICD-10-CM

## 2016-05-02 DIAGNOSIS — Z79899 Other long term (current) drug therapy: Secondary | ICD-10-CM

## 2016-05-02 LAB — POCT GLYCOSYLATED HEMOGLOBIN (HGB A1C): Hemoglobin A1C: 6.1

## 2016-05-02 LAB — GLUCOSE, CAPILLARY: Glucose-Capillary: 68 mg/dL (ref 65–99)

## 2016-05-02 MED ORDER — WAVESENSE AMP W/DEVICE KIT: 1.0000 | PACK | Freq: Once | 0 refills | Status: AC

## 2016-05-02 MED ORDER — GLUCOSE BLOOD VI STRP
ORAL_STRIP | 3 refills | Status: DC
Start: 2016-05-02 — End: 2017-08-15

## 2016-05-02 MED ORDER — ATORVASTATIN CALCIUM 80 MG PO TABS: 80.0000 mg | ORAL_TABLET | Freq: Every day | ORAL | 3 refills | Status: DC

## 2016-05-02 MED ORDER — GLIPIZIDE 5 MG PO TABS: 2.5000 mg | ORAL_TABLET | Freq: Every day | ORAL | 3 refills | Status: DC

## 2016-05-02 MED FILL — ATORVASTATIN 80 MG TABLET: 80 | 30 days supply | Qty: 30 | Fill #0

## 2016-05-02 NOTE — Patient Instructions (Addendum)
It was a pleasure seeing you today! I'm glad you are doing well.  1. Today we talked about your diabetes. Your hemoglobin A1c today was 6.1%!! That is very good. Keep up the diet and exercise. We are going to decrease your Glipizide to 2.5 mg per day. You can take half a tablet until you are out, then pick up the new prescription. This prescription is at [facility].  2. I have sent in a prescription for a free glucometer and test strips to the [facility] Department. The address is [address] 3. I have started you on a medication called Atorvastatin. Please take one of these once a day. This is at the [place] Location: [place] level of [place] ([place])  4. I have also ordered for you to have your first screening mammogram and colonoscopy. You will be called with an appointment.  5. Please take all other medications as directed.  6. I will see you back here in 3 months for a check-up.

## 2016-05-03 NOTE — Assessment & Plan Note (Signed)
Pt had successful cataract surgery on left eye approximately 2 weeks ago and is scheduled to have procedure on right eye in approximately 2 weeks as well. Will follow

## 2016-05-03 NOTE — Assessment & Plan Note (Signed)
Patient has never had a colonoscopy! Denies any changes in stool, blood in stool or unintentional weight loss.  -Referral to GI ordered for screening colo

## 2016-05-03 NOTE — Assessment & Plan Note (Signed)
Patients HbA1c today 6.1%! However, her cbg was 63 and patient complained of some weakness. She had not eaten since early this morning. She was given crackers and OJ with resolution of her symptoms. Patient may be becoming hypoglycemic on new regimen of Glipizide 5 mg so will decrease Glipizide dose as well as provide patient with glucometer so she can check her sugars, especially when she is feeling symptomatic.  -Decrease Glipizide to 2.5 mg daily -Given Rx for Glucometer and test strips -Continue Metformin 1000 mg daily

## 2016-05-03 NOTE — Progress Notes (Signed)
   CC: follow up of hypertension, diabetes and cataracts  HPI:  Laurie Michael is a 65 y.o. female who presents for follow up evaluation of HTN, DM2 and cataracts. For her diabetes [name] takes Metformin 1000 mg daily and she was started on Glipizide 5mg  last visit in July. Her prior HbA1c was 8.2% in may of this year however todays visit shows a HbA1c of 6.1%. She denies any symptomatic hypoglycemic episodes however blood sugar today in clinic 68.  In terms of her HTN, patient is on HCTZ 12.5 mg daily and is doing well. At her last visit, her blood pressure was borderline elevated which normalized on repeat exam. BP today 147/73 and same on repeat evaluation. She reports she is complaint with her medication, eats a low sodium diet and exercises by walking regularly.  Patient recently had cataract surgery on left eye approximately 2 weeks ago. Reports without complication. She is scheduled to have her right eye corrected October 4th.   Past Medical History:  Diagnosis Date  . Obesity     Review of Systems:  Review of Systems  Constitutional: Positive for weight loss (intentional). Negative for chills, fever and malaise/fatigue.  Eyes: Positive for blurred vision (cataracts).  Respiratory: Negative for cough and shortness of breath.   Cardiovascular: Negative for chest pain and leg swelling.  Gastrointestinal: Negative for abdominal pain, heartburn, nausea and vomiting.  Genitourinary: Negative for dysuria.  Neurological: Positive for weakness. Negative for dizziness and headaches.    Physical Exam: Physical Exam  Constitutional: She is well-developed, well-nourished, and in no distress.  HENT:  Head: Normocephalic and atraumatic.  Eyes: Pupils are equal, round, and reactive to light.  Cataract Right eye  Cardiovascular: Normal rate and regular rhythm.   No murmur heard. Pulmonary/Chest: Effort normal and breath sounds normal. No respiratory distress. She has no wheezes.    Abdominal: Soft. Bowel sounds are normal. She exhibits no distension. There is no tenderness.  Neurological: She is alert.  Skin: Skin is warm and dry. She is not diaphoretic.    Vitals:   05/02/16 1539  BP: (!) 147/73  Pulse: 64  Resp: 20  Temp: 97.6 F (36.4 C)  TempSrc: Oral  SpO2: 98%  Weight: 196 lb (88.9 kg)    Assessment & Plan:   See Encounters Tab for problem based charting.  Patient seen with Dr. Dareen Piano

## 2016-05-03 NOTE — Assessment & Plan Note (Signed)
Patient reports compliance with HCTZ 12.5 mg daily. Will increase this to 25 mg Qdaily.  -Increase HCTZ to 25 mg daily

## 2016-05-03 NOTE — Assessment & Plan Note (Signed)
Patient reports she has never had a mammogram! Denies any changes in her breasts, discharge or any other symptoms. -Screening mammo ordered

## 2016-05-03 NOTE — Assessment & Plan Note (Signed)
Screening lipid panel performed at last visit showed significantly elevated cholesterol levels.  Total: 241 TG 369 HDL: 39 LDL 128 Patients ASCVD 10-yr risk score >20% indicating need for high-intensity statin. Importance of diet, exercise and starting statin therapy discussed with the patient and she verbalized understanding.  -Started on Lipitor 80 mg daily

## 2016-05-03 NOTE — Assessment & Plan Note (Signed)
Immunized for influenza and also received PCV23 vax due to hx of diabetes.

## 2016-05-04 NOTE — Progress Notes (Signed)
Internal Medicine Clinic Attending  I saw and evaluated the patient.  I personally confirmed the key portions of the history and exam documented by Dr. Molt and I reviewed pertinent patient test results.  The assessment, diagnosis, and plan were formulated together and I agree with the documentation in the resident's note. 

## 2016-05-05 ENCOUNTER — Other Ambulatory Visit: Payer: Self-pay

## 2016-06-05 MED FILL — ATORVASTATIN 80 MG TABLET: 80 | 30 days supply | Qty: 30 | Fill #1

## 2016-07-05 MED FILL — ATORVASTATIN 80 MG TABLET: 80 | 30 days supply | Qty: 30 | Fill #2

## 2016-07-11 ENCOUNTER — Other Ambulatory Visit: Payer: Self-pay | Admitting: Gastroenterology

## 2016-07-11 ENCOUNTER — Encounter (HOSPITAL_COMMUNITY): Payer: Self-pay | Admitting: *Deleted

## 2016-07-11 NOTE — Progress Notes (Signed)
Spoke with pt for pre-op call. Pt denies cardiac history, chest pain or sob. Pt is diabetic. Last A1C was 6.1 on 05/02/16. Pt states her fasting blood sugar is usually around 130. Pt instructed not to take any of her diabetic medications in the AM. Instructed her to check her blood sugar in the AM. If blood sugar is 70 or below, treat with 1/2 cup of clear juice (apple or cranberry) and recheck blood sugar 15 minutes after drinking juice. If blood sugar continues to be 70 or below, call the Endoscopy department and ask to speak to a nurse. Pt voiced understanding.

## 2016-07-12 ENCOUNTER — Encounter (HOSPITAL_COMMUNITY)
Admission: RE | Disposition: A | Discharge status: Home / Self Care | Payer: Self-pay | Source: Ambulatory Visit | Attending: Gastroenterology

## 2016-07-12 ENCOUNTER — Ambulatory Visit (HOSPITAL_COMMUNITY): Payer: MEDICAID | Admitting: Anesthesiology

## 2016-07-12 ENCOUNTER — Encounter (HOSPITAL_COMMUNITY): Payer: Self-pay | Admitting: *Deleted

## 2016-07-12 ENCOUNTER — Ambulatory Visit (HOSPITAL_COMMUNITY)
Admission: RE | Admit: 2016-07-12 | Discharge: 2016-07-12 | Disposition: A | Discharge status: Home / Self Care | Payer: MEDICAID | Source: Ambulatory Visit | Attending: Gastroenterology | Admitting: Gastroenterology

## 2016-07-12 DIAGNOSIS — D125 Benign neoplasm of sigmoid colon: Secondary | ICD-10-CM | POA: Insufficient documentation

## 2016-07-12 DIAGNOSIS — Z79899 Other long term (current) drug therapy: Secondary | ICD-10-CM | POA: Insufficient documentation

## 2016-07-12 DIAGNOSIS — K5730 Diverticulosis of large intestine without perforation or abscess without bleeding: Secondary | ICD-10-CM | POA: Insufficient documentation

## 2016-07-12 DIAGNOSIS — I10 Essential (primary) hypertension: Secondary | ICD-10-CM | POA: Insufficient documentation

## 2016-07-12 DIAGNOSIS — Z6834 Body mass index (BMI) 34.0-34.9, adult: Secondary | ICD-10-CM | POA: Insufficient documentation

## 2016-07-12 DIAGNOSIS — K635 Polyp of colon: Secondary | ICD-10-CM | POA: Insufficient documentation

## 2016-07-12 DIAGNOSIS — Z87891 Personal history of nicotine dependence: Secondary | ICD-10-CM | POA: Insufficient documentation

## 2016-07-12 HISTORY — DX: Essential (primary) hypertension: I10

## 2016-07-12 HISTORY — PX: COLONOSCOPY: SHX5424

## 2016-07-12 HISTORY — DX: Type 2 diabetes mellitus without complications: E11.9

## 2016-07-12 HISTORY — DX: Other specified postprocedural states: R11.2

## 2016-07-12 HISTORY — DX: Other specified postprocedural states: Z98.890

## 2016-07-12 HISTORY — DX: Irritable bowel syndrome, unspecified: K58.9

## 2016-07-12 HISTORY — DX: Family history of other specified conditions: Z84.89

## 2016-07-12 HISTORY — DX: Other complications of anesthesia, initial encounter: T88.59XA

## 2016-07-12 HISTORY — DX: Adverse effect of unspecified anesthetic, initial encounter: T41.45XA

## 2016-07-12 LAB — GLUCOSE, CAPILLARY: GLUCOSE-CAPILLARY: 117 mg/dL — AB (ref 65–99)

## 2016-07-12 SURGERY — COLONOSCOPY
Anesthesia: Monitor Anesthesia Care

## 2016-07-12 MED ORDER — LACTATED RINGERS IV SOLN
INTRAVENOUS | Status: DC
  Administered 2016-07-12: 08:00:00 via INTRAVENOUS

## 2016-07-12 MED ORDER — LIDOCAINE HCL (CARDIAC) 20 MG/ML IV SOLN
INTRAVENOUS | Status: DC | PRN
  Administered 2016-07-12: 50 mg via INTRATRACHEAL

## 2016-07-12 MED ORDER — ONDANSETRON HCL 4 MG/2ML IJ SOLN
INTRAMUSCULAR | Status: DC | PRN
  Administered 2016-07-12: 4 mg via INTRAVENOUS

## 2016-07-12 MED ORDER — FENTANYL CITRATE (PF) 100 MCG/2ML IJ SOLN
INTRAMUSCULAR | Status: DC | PRN
Start: 2016-07-12 — End: 2016-07-12
  Administered 2016-07-12 (×2): 50 ug via INTRAVENOUS

## 2016-07-12 MED ORDER — HYDROMORPHONE HCL 1 MG/ML IJ SOLN: 0.2500 mg | INTRAMUSCULAR | Status: DC | PRN

## 2016-07-12 MED ORDER — PROMETHAZINE HCL 25 MG/ML IJ SOLN: 6.2500 mg | INTRAMUSCULAR | Status: DC | PRN

## 2016-07-12 MED ORDER — PROPOFOL 10 MG/ML IV BOLUS
INTRAVENOUS | Status: DC | PRN
  Administered 2016-07-12 (×2): 30 mg via INTRAVENOUS

## 2016-07-12 MED ORDER — LACTATED RINGERS IV SOLN
INTRAVENOUS | Status: DC | PRN
  Administered 2016-07-12: 08:00:00 via INTRAVENOUS

## 2016-07-12 MED ORDER — MIDAZOLAM HCL 5 MG/5ML IJ SOLN
INTRAMUSCULAR | Status: DC | PRN
  Administered 2016-07-12: 2 mg via INTRAVENOUS

## 2016-07-12 MED ORDER — PROPOFOL 500 MG/50ML IV EMUL
INTRAVENOUS | Status: DC | PRN
  Administered 2016-07-12: 75 ug/kg/min via INTRAVENOUS

## 2016-07-12 NOTE — Discharge Instructions (Signed)
YOU HAD AN ENDOSCOPIC PROCEDURE TODAY: Refer to the procedure report and other information in the discharge instructions given to you for any specific questions about what was found during the examination. If this information does not answer your questions, please call [facility] office at [phone] to clarify.   YOU SHOULD EXPECT: Some feelings of bloating in the abdomen. Passage of more gas than usual. Walking can help get rid of the air that was put into your GI tract during the procedure and reduce the bloating. If you had a lower endoscopy (such as a colonoscopy or flexible sigmoidoscopy) you may notice spotting of blood in your stool or on the toilet paper. Some abdominal soreness may be present for a day or two, also.  DIET: Your first meal following the procedure should be a light meal and then it is ok to progress to your normal diet. A half-sandwich or bowl of soup is an example of a good first meal. Heavy or fried foods are harder to digest and may make you feel nauseous or bloated. Drink plenty of fluids but you should avoid alcoholic beverages for 24 hours. If you had a esophageal dilation, please see attached instructions for diet.   ACTIVITY: Your care partner should take you home directly after the procedure. You should plan to take it easy, moving slowly for the rest of the day. You can resume normal activity the day after the procedure however YOU SHOULD NOT DRIVE, use power tools, machinery or perform tasks that involve climbing or major physical exertion for 24 hours (because of the sedation medicines used during the test).   SYMPTOMS TO REPORT IMMEDIATELY: A gastroenterologist can be reached at any hour. Please call [phone]  for any of the following symptoms:  Following lower endoscopy (colonoscopy, flexible sigmoidoscopy) Excessive amounts of blood in the stool  Significant tenderness, worsening of abdominal pains  Swelling of the abdomen that is new, acute  Fever of 100 or  higher    FOLLOW UP:  If any biopsies were taken you will be contacted by phone or by letter within the next 1-3 weeks. Call [phone]  if you have not heard about the biopsies in 3 weeks.  Please also call with any specific questions about appointments or follow up tests.   Diverticulosis Diverticulosis is the condition that develops when small pouches (diverticula) form in the wall of your colon. Your colon, or large intestine, is where water is absorbed and stool is formed. The pouches form when the inside layer of your colon pushes through weak spots in the outer layers of your colon. CAUSES  No one knows exactly what causes diverticulosis. RISK FACTORS  Being older than 31. Your risk for this condition increases with age. Diverticulosis is rare in people younger than 40 years. By age 72, almost everyone has it.  Eating a low-fiber diet.  Being frequently constipated.  Being overweight.  Not getting enough exercise.  Smoking.  Taking over-the-counter pain medicines, like aspirin and ibuprofen. SYMPTOMS  Most people with diverticulosis do not have symptoms. DIAGNOSIS  Because diverticulosis often has no symptoms, health care providers often discover the condition during an exam for other colon problems. In many cases, a health care provider will diagnose diverticulosis while using a flexible scope to examine the colon (colonoscopy). TREATMENT  If you have never developed an infection related to diverticulosis, you may not need treatment. If you have had an infection before, treatment may include:  Eating more fruits, vegetables, and grains.  Taking a fiber supplement.  Taking a live bacteria supplement (probiotic).  Taking medicine to relax your colon. HOME CARE INSTRUCTIONS   Drink at least 6-8 glasses of water each day to prevent constipation.  Try not to strain when you have a bowel movement.  Keep all follow-up appointments. If you have had an infection  before:  Increase the fiber in your diet as directed by your health care provider or dietitian.  Take a dietary fiber supplement if your health care provider approves.  Only take medicines as directed by your health care provider. SEEK MEDICAL CARE IF:   You have abdominal pain.  You have bloating.  You have cramps.  You have not gone to the bathroom in 3 days. SEEK IMMEDIATE MEDICAL CARE IF:   Your pain gets worse.  Yourbloating becomes very bad.  You have a fever or chills, and your symptoms suddenly get worse.  You begin vomiting.  You have bowel movements that are bloody or black. MAKE SURE YOU:  Understand these instructions.  Will watch your condition.  Will get help right away if you are not doing well or get worse. This information is not intended to replace advice given to you by your health care provider. Make sure you discuss any questions you have with your health care provider. Document Released: 04/27/2004 Document Revised: 08/05/2013 Document Reviewed: 06/25/2013 Elsevier Interactive Patient Education  2017 [employer].   Colon Polyps Introduction Polyps are tissue growths inside the body. Polyps can grow in many places, including the large intestine (colon). A polyp may be a round bump or a mushroom-shaped growth. You could have one polyp or several. Most colon polyps are noncancerous (benign). However, some colon polyps can become cancerous over time. What are the causes? The exact cause of colon polyps is not known. What increases the risk? This condition is more likely to develop in people who:  Have a family history of colon cancer or colon polyps.  Are older than 75 or older than 45 if they are African American.  Have inflammatory bowel disease, such as ulcerative colitis or Crohn disease.  Are overweight.  Smoke cigarettes.  Do not get enough exercise.  Drink too much alcohol.  Eat a diet that is:  High in fat and red  meat.  Low in fiber.  Had childhood cancer that was treated with abdominal radiation. What are the signs or symptoms? Most polyps do not cause symptoms. If you have symptoms, they may include:  Blood coming from your rectum when having a bowel movement.  Blood in your stool.The stool may look dark red or black.  A change in bowel habits, such as constipation or diarrhea. How is this diagnosed? This condition is diagnosed with a colonoscopy. This is a procedure that uses a lighted, flexible scope to look at the inside of your colon. How is this treated? Treatment for this condition involves removing any polyps that are found. Those polyps will then be tested for cancer. If cancer is found, your health care provider will talk to you about options for colon cancer treatment. Follow these instructions at home: Diet  Eat plenty of fiber, such as fruits, vegetables, and whole grains.  Eat foods that are high in calcium and vitamin D, such as milk, cheese, yogurt, eggs, liver, fish, and broccoli.  Limit foods high in fat, red meats, and processed meats, such as hot dogs, sausage, bacon, and lunch meats.  Maintain a healthy weight, or lose weight if recommended  by your health care provider. General instructions  Do not smoke cigarettes.  Do not drink alcohol excessively.  Keep all follow-up visits as told by your health care provider. This is important. This includes keeping regularly scheduled colonoscopies. Talk to your health care provider about when you need a colonoscopy.  Exercise every day or as told by your health care provider. Contact a health care provider if:  You have new or worsening bleeding during a bowel movement.  You have new or increased blood in your stool.  You have a change in bowel habits.  You unexpectedly lose weight. This information is not intended to replace advice given to you by your health care provider. Make sure you discuss any questions you  have with your health care provider. Document Released: 04/26/2004 Document Revised: 01/06/2016 Document Reviewed: 06/21/2015  2017 Elsevier   Monitored Anesthesia Care, Care After These instructions provide you with information about caring for yourself after your procedure. Your health care provider may also give you more specific instructions. Your treatment has been planned according to current medical practices, but problems sometimes occur. Call your health care provider if you have any problems or questions after your procedure. What can I expect after the procedure? After your procedure, it is common to:  Feel sleepy for several hours.  Feel clumsy and have poor balance for several hours.  Feel forgetful about what happened after the procedure.  Have poor judgment for several hours.  Feel nauseous or vomit.  Have a sore throat if you had a breathing tube during the procedure. Follow these instructions at home: For at least 24 hours after the procedure:   Do not:  Participate in activities in which you could fall or become injured.  Drive.  Use heavy machinery.  Drink alcohol.  Take sleeping pills or medicines that cause drowsiness.  Make important decisions or sign legal documents.  Take care of children on your own.  Rest. Eating and drinking  Follow the diet that is recommended by your health care provider.  If you vomit, drink water, juice, or soup when you can drink without vomiting.  Make sure you have little or no nausea before eating solid foods. General instructions  Have a responsible adult stay with you until you are awake and alert.  Take over-the-counter and prescription medicines only as told by your health care provider.  If you smoke, do not smoke without supervision.  Keep all follow-up visits as told by your health care provider. This is important. Contact a health care provider if:  You keep feeling nauseous or you keep  vomiting.  You feel light-headed.  You develop a rash.  You have a fever. Get help right away if:  You have trouble breathing. This information is not intended to replace advice given to you by your health care provider. Make sure you discuss any questions you have with your health care provider. Document Released: 11/21/2015 Document Revised: 03/22/2016 Document Reviewed: 11/21/2015 Elsevier Interactive Patient Education  2017 [employer].

## 2016-07-12 NOTE — Op Note (Signed)
[facility] Patient Name: Laurie Michael Procedure Date : 07/12/2016 MRN: [id number] Attending MD: Wonda Horner , MD Date of Birth: March 11, 1951 CSN: [id number] Age: 65 Admit Type: Outpatient Procedure:                Colonoscopy Indications:              Diarrhea Providers:                Wonda Horner, MD, Vista Lawman, RN, Alfonso Patten,                            Technician, Jacquiline Doe, CRNA Referring MD:              Medicines:                Propofol per Anesthesia Complications:            No immediate complications. Estimated Blood Loss:     Estimated blood loss: none. Procedure:                Pre-Anesthesia Assessment:                           - Prior to the procedure, a History and Physical                            was performed, and patient medications and                            allergies were reviewed. The patient's tolerance of                            previous anesthesia was also reviewed. The risks                            and benefits of the procedure and the sedation                            options and risks were discussed with the patient.                            All questions were answered, and informed consent                            was obtained. Prior Anticoagulants: The patient has                            taken no previous anticoagulant or antiplatelet                            agents. ASA Grade Assessment: III - A patient with                            severe systemic disease. After reviewing the risks  and benefits, the patient was deemed in                            satisfactory condition to undergo the procedure.                           After obtaining informed consent, the colonoscope                            was passed under direct vision. Throughout the                            procedure, the patient's blood pressure, pulse, and                            oxygen saturations were  monitored continuously. The                            EC-[id number]) scope was introduced through                            the anus and advanced to the the cecum, identified                            by appendiceal orifice and ileocecal valve. The                            ileocecal valve, appendiceal orifice, and rectum                            were photographed. The colonoscopy was performed                            without difficulty. The patient tolerated the                            procedure well. The quality of the bowel                            preparation was good. Scope In: 9:12:11 AM Scope Out: 9:33:04 AM Scope Withdrawal Time: 0 hours 13 minutes 36 seconds  Total Procedure Duration: 0 hours 20 minutes 53 seconds  Findings:      The perianal and digital rectal examinations were normal.      A few diverticula were found in the sigmoid colon.      A 3 mm polyp was found in the sigmoid colon. The polyp was sessile. The       polyp was removed with a cold biopsy forceps. Resection and retrieval       were complete.      A 5 mm polyp was found in the recto-sigmoid colon. The polyp was       sessile. The polyp was removed with a hot snare. Resection and retrieval       were complete.      Biopsies for histology were taken with a cold forceps for  evaluation of       microscopic colitis. Impression:               - Diverticulosis in the sigmoid colon.                           - One 3 mm polyp in the sigmoid colon, removed with                            a cold biopsy forceps. Resected and retrieved.                           - One 5 mm polyp at the recto-sigmoid colon,                            removed with a hot snare. Resected and retrieved.                           - Biopsies were taken with a cold forceps for                            evaluation of microscopic colitis. Moderate Sedation:      . Recommendation:           - Resume regular diet.                            - Continue present medications.                           - No ibuprofen, naproxen, or other non-steroidal                            anti-inflammatory drugs for 10 days after polyp                            removal.                           - Repeat colonoscopy for surveillance based on                            pathology results. Procedure Code(s):        --- Professional ---                           [phone], Colonoscopy, flexible; with removal of                            tumor(s), polyp(s), or other lesion(s) by snare                            technique                           [id number], 65, Colonoscopy, flexible; with biopsy,  single or multiple Diagnosis Code(s):        --- Professional ---                           D12.5, Benign neoplasm of sigmoid colon                           D12.7, Benign neoplasm of rectosigmoid junction                           R19.7, Diarrhea, unspecified                           K57.30, Diverticulosis of large intestine without                            perforation or abscess without bleeding CPT copyright 2016 American Medical Association. All rights reserved. The codes documented in this report are preliminary and upon coder review may  be revised to meet current compliance requirements. Wonda Horner, MD 07/12/2016 9:42:00 AM This report has been signed electronically. Number of Addenda: 0

## 2016-07-12 NOTE — Anesthesia Preprocedure Evaluation (Addendum)
Anesthesia Evaluation  Patient identified by MRN, date of birth, ID band Patient awake    Reviewed: Allergy & Precautions, NPO status , Patient's Chart, lab work & pertinent test results  History of Anesthesia Complications (+) PONV and history of anesthetic complications  Airway Mallampati: II  TM Distance: >3 FB Neck ROM: Full    Dental no notable dental hx. (+) Dental Advisory Given   Pulmonary former smoker,    Pulmonary exam normal        Cardiovascular hypertension, Normal cardiovascular exam     Neuro/Psych negative neurological ROS     GI/Hepatic negative GI ROS, Neg liver ROS,   Endo/Other  diabetesMorbid obesity  Renal/GU negative Renal ROS     Musculoskeletal negative musculoskeletal ROS (+)   Abdominal   Peds  Hematology negative hematology ROS (+)   Anesthesia Other Findings Day of surgery medications reviewed with the patient.  Reproductive/Obstetrics                            Anesthesia Physical Anesthesia Plan  ASA: III  Anesthesia Plan: MAC   Post-op Pain Management:    Induction:   Airway Management Planned: Natural Airway and Simple Face Mask  Additional Equipment:   Intra-op Plan:   Post-operative Plan:   Informed Consent: I have reviewed the patients History and Physical, chart, labs and discussed the procedure including the risks, benefits and alternatives for the proposed anesthesia with the patient or authorized representative who has indicated his/her understanding and acceptance.   Dental advisory given  Plan Discussed with: CRNA and Anesthesiologist  Anesthesia Plan Comments:        Anesthesia Quick Evaluation

## 2016-07-12 NOTE — H&P (Signed)
The patient is a 64 year old female who presents to the outpatient endoscopy department for colonoscopy. See attached H&P. No change.  Physical  No distress  Heart regular rhythm no murmurs  Lungs clear  Abdomen: Bowel sounds normal, soft, nontender  Impression: 65 year old female presents for outpatient colonoscopy. She has never had a colonoscopy. She has had some weight loss which has been voluntary according to her and postprandial stooling.  Plan colonoscopy

## 2016-07-12 NOTE — Anesthesia Postprocedure Evaluation (Addendum)
Anesthesia Post Note  Patient: Laurie Michael  Procedure(s) Performed: Procedure(s) (LRB): COLONOSCOPY (N/A)  Patient location during evaluation: Endoscopy Anesthesia Type: MAC Level of consciousness: awake and alert Pain management: pain level controlled Vital Signs Assessment: post-procedure vital signs reviewed and stable Respiratory status: spontaneous breathing and respiratory function stable Cardiovascular status: stable Anesthetic complications: no    Last Vitals:  Vitals:   07/12/16 0953 07/12/16 1003  BP: 114/73 120/69  Pulse: 75 73  Resp: 19 19  Temp:      Last Pain:  Vitals:   07/12/16 0943  TempSrc: Oral                 Jacyln Carmer DANIEL

## 2016-07-12 NOTE — Anesthesia Procedure Notes (Signed)
Procedure Name: MAC Date/Time: 07/12/2016 9:05 AM Performed by: Jacquiline Doe A Pre-anesthesia Checklist: Patient identified, Emergency Drugs available, Suction available and Patient being monitored Oxygen Delivery Method: Nasal cannula Intubation Type: IV induction Placement Confirmation: positive ETCO2 Dental Injury: Teeth and Oropharynx as per pre-operative assessment

## 2016-07-12 NOTE — Transfer of Care (Signed)
Immediate Anesthesia Transfer of Care Note  Patient: Laurie Michael  Procedure(s) Performed: Procedure(s): COLONOSCOPY (N/A)  Patient Location: Endoscopy Unit  Anesthesia Type:MAC  Level of Consciousness: awake, oriented, sedated, patient cooperative and responds to stimulation  Airway & Oxygen Therapy: Patient Spontanous Breathing and Patient connected to nasal cannula oxygen  Post-op Assessment: Report given to RN, Post -op Vital signs reviewed and stable, Patient moving all extremities and Patient moving all extremities X 4  Post vital signs: Reviewed and stable  Last Vitals:  Vitals:   07/12/16 0805 07/12/16 0943  BP: (!) 142/80 104/65  Pulse: 76 84  Resp: (!) 23 15  Temp: 36.5 C 36.3 C    Last Pain:  Vitals:   07/12/16 0943  TempSrc: Oral         Complications: No apparent anesthesia complications

## 2016-07-24 ENCOUNTER — Ambulatory Visit: Payer: Self-pay

## 2016-07-25 ENCOUNTER — Telehealth: Payer: Self-pay | Admitting: Internal Medicine

## 2016-07-25 ENCOUNTER — Encounter: Payer: Self-pay | Admitting: Internal Medicine

## 2016-07-25 NOTE — Telephone Encounter (Signed)
APT. REMINDER CALL, LMTCB °

## 2016-07-26 ENCOUNTER — Encounter: Payer: Self-pay | Admitting: Dietician

## 2016-07-26 ENCOUNTER — Ambulatory Visit (INDEPENDENT_AMBULATORY_CARE_PROVIDER_SITE_OTHER): Payer: Self-pay | Admitting: Internal Medicine

## 2016-07-26 VITALS — BP 145/89 | HR 80 | Temp 97.7°F | Ht 64.0 in | Wt 196.3 lb

## 2016-07-26 DIAGNOSIS — E784 Other hyperlipidemia: Secondary | ICD-10-CM

## 2016-07-26 DIAGNOSIS — Z87891 Personal history of nicotine dependence: Secondary | ICD-10-CM

## 2016-07-26 DIAGNOSIS — E785 Hyperlipidemia, unspecified: Principal | ICD-10-CM

## 2016-07-26 DIAGNOSIS — I10 Essential (primary) hypertension: Secondary | ICD-10-CM

## 2016-07-26 DIAGNOSIS — Z1239 Encounter for other screening for malignant neoplasm of breast: Secondary | ICD-10-CM

## 2016-07-26 DIAGNOSIS — E1100 Type 2 diabetes mellitus with hyperosmolarity without nonketotic hyperglycemic-hyperosmolar coma (NKHHC): Secondary | ICD-10-CM

## 2016-07-26 DIAGNOSIS — Z79899 Other long term (current) drug therapy: Secondary | ICD-10-CM

## 2016-07-26 DIAGNOSIS — E119 Type 2 diabetes mellitus without complications: Secondary | ICD-10-CM

## 2016-07-26 DIAGNOSIS — E1169 Type 2 diabetes mellitus with other specified complication: Secondary | ICD-10-CM

## 2016-07-26 DIAGNOSIS — Z7984 Long term (current) use of oral hypoglycemic drugs: Secondary | ICD-10-CM

## 2016-07-26 LAB — GLUCOSE, CAPILLARY: Glucose-Capillary: 75 mg/dL (ref 65–99)

## 2016-07-26 LAB — POCT GLYCOSYLATED HEMOGLOBIN (HGB A1C): Hemoglobin A1C: 6

## 2016-07-26 MED ORDER — METFORMIN HCL 1000 MG PO TABS: 1000.0000 mg | ORAL_TABLET | Freq: Every day | ORAL | 3 refills | Status: DC

## 2016-07-26 MED ORDER — HYDROCHLOROTHIAZIDE 12.5 MG PO TABS: 25.0000 mg | ORAL_TABLET | Freq: Every day | ORAL | 2 refills | Status: DC

## 2016-07-26 MED ORDER — ATORVASTATIN CALCIUM 80 MG PO TABS: 80.0000 mg | ORAL_TABLET | Freq: Every day | ORAL | 3 refills | Status: DC

## 2016-07-26 MED ORDER — GLIPIZIDE 5 MG PO TABS: 2.5000 mg | ORAL_TABLET | Freq: Every day | ORAL | 3 refills | Status: DC

## 2016-07-26 NOTE — Assessment & Plan Note (Addendum)
Pt was given for order for mammo in September however has been having difficulty finding a place that accepts the orange card. Will work with staff here for a solution as its important the patient has this screening test as shes never had a mammogram before.  -Number given for program that accepts orange card -Will f/u results of mammo

## 2016-07-26 NOTE — Assessment & Plan Note (Addendum)
Not at goal. HCTZ increased to 25mg  last visit however reports Rx was never received by pharmacy. BP remains above goal this visit at 145/89. Will continue with plan to increase HCTZ this visit and Rx has been sent to her pharmacy.  -Increase HCTZ to 25mg  -Monitor BP at home -F/u at next visit in 1 month

## 2016-07-26 NOTE — Patient Instructions (Signed)
To get an affordable meter other than the one you have from the [facility], you can obtain a sample Accu chek meter and use the copay card provided to you today for strips or you can purchase a [facility] Confirm meter and strips.

## 2016-07-26 NOTE — Assessment & Plan Note (Addendum)
Last HbA1c 6.1% 04/2016. At that time her Glipizide was decreased from 5mg  to 2.5 mg as pt experienced some symptomatic hypoglycemia. Reports compliance with this change. Pt concerned as the readings from her glucometer are different from her own. Usual blood sugars on her's in 130's vs husbands which are in 180's. Will compare CBG obtained here in clinic to one obtained from her glucometer at same time.  -HbA1c today 6% -Pt does not need to check her blood sugar unless she feels hypoglycemic -f/u 3 months

## 2016-07-26 NOTE — Progress Notes (Signed)
   CC: follow-up evaluation of HTN and DM2  HPI:  [name] is a 65 y.o. F who presents for 3 month follow-up evaluation of DM2, HTN and HLD. Pt reports compliance with her medications however reports she is having issues with her glucometer. She reports readings on her meter are in the 180's however when she checks it using her husbands at the same time it's in the 130's. She reports she has new test strips and has never had a problem with the glucometer before. Denies any symptomatic hypoglycemia.   She had a colonoscopy 11/29 s/p polypectomy x3. Path report returned benign.  Reports she was unable to obtain mammogram, was told they do not accept the orange card for mammos.    Past Medical History:  Diagnosis Date  . Complication of anesthesia   . Diabetes mellitus without complication ([place])   . Family history of adverse reaction to anesthesia    mom is slow to wake up  . Hypertension   . Irritable bowel   . Obesity   . PONV (postoperative nausea and vomiting)     Review of Systems:  Review of Systems  Constitutional: Negative for chills and fever.  Eyes: Negative for blurred vision and double vision.  Respiratory: Negative for cough, shortness of breath and wheezing.   Cardiovascular: Negative for chest pain, palpitations and leg swelling.  Gastrointestinal: Negative for abdominal pain, constipation, diarrhea, nausea and vomiting.  Neurological: Negative for dizziness, weakness and headaches.  Endo/Heme/Allergies: Negative for polydipsia.   Physical Exam: Physical Exam  Constitutional: She is oriented to person, place, and time and well-developed, well-nourished, and in no distress.  HENT:  Head: Normocephalic and atraumatic.  Eyes: Right eye exhibits no discharge. Left eye exhibits no discharge. No scleral icterus.  Cardiovascular: Normal rate, regular rhythm and normal heart sounds.   No murmur heard. Pulmonary/Chest: Effort normal and breath sounds normal. No  respiratory distress. She has no wheezes.  Abdominal: Soft. Bowel sounds are normal. She exhibits no distension. There is no tenderness.  Neurological: She is alert and oriented to person, place, and time. She exhibits normal muscle tone.  Skin: Skin is warm and dry. No rash noted. She is not diaphoretic. No erythema.  Psychiatric: Mood, memory, affect and judgment normal.    Vitals:   07/26/16 1333  BP: (!) 145/89  Pulse: 80  Temp: 97.7 F (36.5 C)  TempSrc: Oral  SpO2: 98%  Weight: 196 lb 4.8 oz (89 kg)  Height: 5\' 4"  (1.626 m)    Assessment & Plan:   See Encounters Tab for problem based charting.  Patient seen with Dr. Evette Doffing

## 2016-07-26 NOTE — Progress Notes (Signed)
Saw patient today to encourage her to follow up for DSMT for her diabetes. She wanted to schedule an appointment same day as one of her future appointments. Also assisted her today to obtain an affordable accurate meter other than the one you have from the [facility], you can obtain a sample Accu chek meter and use the copay card provided to you today for strips or you can purchase a [facility] Confirm meter and strips.  Appointment scheduled for 08/25/15. Will request a referral for 2018.

## 2016-07-26 NOTE — Patient Instructions (Addendum)
It was a pleasure seeing you today! I'm glad you're doing well!   1. Today we talked about your diabetes. You are doing so well! Your hemoglobin A1C is 6% which means your blood sugar over the past 3 months has been around 130. You no longer need to check your blood sugars at home! Only when you feel hypoglycemic. 2. Today we also talked about your blood pressure. For this I am increasing your hydrochlorothiazide to 25 mg daily. You can take 2 12.5mg  tabs until your bottle is out, then pick up the new prescription from [facility].  3. I am providing you with a phone number to call to get your mammogram. [phone]. Please call this number and tell them you have the orange card and need a screening mammogram. They will be able to help you. 4. Please see [place] back in 3 months!

## 2016-07-26 NOTE — Assessment & Plan Note (Signed)
Reports compliance with Lipitor 80 mg daily. Denied any side effects of muscle aches, pains or weakness.  -Appears tolerant to statins, continue Lipitor

## 2016-07-27 NOTE — Progress Notes (Signed)
Internal Medicine Clinic Attending  I saw and evaluated the patient.  I personally confirmed the key portions of the history and exam documented by Dr. Molt and I reviewed pertinent patient test results.  The assessment, diagnosis, and plan were formulated together and I agree with the documentation in the resident's note. 

## 2016-08-10 MED FILL — ATORVASTATIN 80 MG TABLET: 80 | 30 days supply | Qty: 30 | Fill #3

## 2016-08-23 ENCOUNTER — Telehealth: Payer: Self-pay | Admitting: Internal Medicine

## 2016-08-23 NOTE — Telephone Encounter (Signed)
APT. REMINDER CALL, LMTCB °

## 2016-08-24 ENCOUNTER — Ambulatory Visit (INDEPENDENT_AMBULATORY_CARE_PROVIDER_SITE_OTHER): Payer: Self-pay | Admitting: Dietician

## 2016-08-24 ENCOUNTER — Ambulatory Visit: Payer: Self-pay

## 2016-08-24 DIAGNOSIS — E119 Type 2 diabetes mellitus without complications: Secondary | ICD-10-CM

## 2016-08-24 DIAGNOSIS — Z713 Dietary counseling and surveillance: Secondary | ICD-10-CM

## 2016-08-24 DIAGNOSIS — E1100 Type 2 diabetes mellitus with hyperosmolarity without nonketotic hyperglycemic-hyperosmolar coma (NKHHC): Secondary | ICD-10-CM

## 2016-08-25 ENCOUNTER — Other Ambulatory Visit: Payer: Self-pay | Admitting: Internal Medicine

## 2016-08-25 DIAGNOSIS — K036 Deposits [accretions] on teeth: Secondary | ICD-10-CM | POA: Insufficient documentation

## 2016-08-25 NOTE — Patient Instructions (Signed)
My plan to support myself in continuing these changes to care for my diabetes is to attend or contact:  Type 2 diabetes support group : 2nd Monday of every month from 6-7 PM at [address] [facility] conference room [phone]   Places I can get my health related questions answered are: doctor's office, Diabetes Educator, Dietitian, pharmacist, church  Journals ? Diabetes Forecast- [phone]- [email or web address] ? Diabetes Self-Management- [phone]- [email or web address]  Please follow up for diabetes self management training yearly.  Medicare covers dietitian visits 100%.

## 2016-08-25 NOTE — Progress Notes (Signed)
Diabetes Self-Management Education  Visit Type:  Follow-up  Appt. Start Time: 1430 Appt. End Time: 1500  08/25/2016  [name], identified by name and date of birth, is a 66 y.o. female with a diagnosis of Diabetes:  .Type 2 diabetes is here with her spouse.    ASSESSMENT  There were no vitals taken for this visit. There is no height or weight on file to calculate BMI.       Diabetes Self-Management Education - 08/25/16 1400      Health Coping   How would you rate your overall health? Excellent     Psychosocial Assessment   Patient Belief/Attitude about Diabetes Motivated to manage diabetes   Self-care barriers Lack of material resources   Self-management support Family;CDE visits   Patient Concerns Reason for needed DSMES when her blood sugar/A1C is fine   Special Needs None   Preferred Learning Style No preference indicated   Learning Readiness Ready     Complications   Last HgB A1C per patient/outside source (P)  6.1 %   How often do you check your blood sugar? (P)  3-4 times / week   Fasting Blood glucose range (mg/dL) (P)  70-129;130-179   Number of hypoglycemic episodes per month (P)  0   Number of hyperglycemic episodes per week (P)  0   Have you had a dilated eye exam in the past 12 months? (P)  Yes   Have you had a dental exam in the past 12 months? (P)  No   Are you checking your feet? (P)  Yes     Post-Education Assessment   Patient understands incorporating nutritional management into lifestyle. Demonstrates understanding / competency   Patient undertands incorporating physical activity into lifestyle. Demonstrates understanding / competency   Patient understands using medications safely. Demonstrates understanding / competency   Patient understands monitoring blood glucose, interpreting and using results Demonstrates understanding / competency   Patient understands prevention, detection, and treatment of acute complications. Demonstrates understanding  / competency   Patient understands prevention, detection, and treatment of chronic complications. Demonstrates understanding / competency   Patient understands how to develop strategies to promote health/change behavior. Demonstrates understanding / competency     Subsequent Visit   Since your last visit have you continued or begun to take your medications as prescribed? Not on Medications   Since your last visit have you had your blood pressure checked? No   Since your last visit have you experienced any weight changes? No change   Since your last visit, are you checking your blood glucose at least once a day? No      Learning Objective:  Patient will have a greater understanding of diabetes self-management. Patient education plan is to attend individual and/or group sessions per assessed needs and concerns. My plan to support myself in continuing these changes to care for my diabetes is to attend or contact:  Type 2 diabetes support group : 2nd Monday of every month from 6-7 PM at [address] [facility] conference room [phone]   Places I can get my health related questions answered are: doctor's office, Diabetes Educator, Dietitian, pharmacist, church Journals ? Diabetes Forecast- [phone]- [email or web address] ? Diabetes Self-Management- [phone]- [email or web address]  Plan:   There are no Patient Instructions on file for this visit.   Expected Outcomes:     Education material provided: Support group flyer  If problems or questions, patient to contact team via:  Phone  Future DSME appointment: -  annually [place], [place] 08/25/2016 2:18 PM.

## 2016-09-01 ENCOUNTER — Ambulatory Visit: Payer: Self-pay

## 2016-09-04 ENCOUNTER — Ambulatory Visit: Payer: Self-pay

## 2016-09-13 MED FILL — ATORVASTATIN 80 MG TABLET: 80 | 30 days supply | Qty: 30 | Fill #0

## 2016-09-15 ENCOUNTER — Other Ambulatory Visit: Payer: Self-pay | Admitting: Obstetrics and Gynecology

## 2016-09-15 DIAGNOSIS — Z1231 Encounter for screening mammogram for malignant neoplasm of breast: Secondary | ICD-10-CM

## 2016-09-27 ENCOUNTER — Other Ambulatory Visit: Payer: Self-pay | Admitting: Internal Medicine

## 2016-09-27 ENCOUNTER — Telehealth: Payer: Self-pay | Admitting: *Deleted

## 2016-09-27 DIAGNOSIS — Z0000 Encounter for general adult medical examination without abnormal findings: Secondary | ICD-10-CM

## 2016-09-27 NOTE — Telephone Encounter (Signed)
SPOKE WITH PATIENT REGARDING HER GI REFERRAL FOR COLONOSCOPY. PATIENT STATES SHE HAD ONE DONE IN 07-2016 / [facility], DR. Penelope Coop

## 2016-10-03 ENCOUNTER — Ambulatory Visit (HOSPITAL_COMMUNITY)
Admission: RE | Admit: 2016-10-03 | Discharge: 2016-10-03 | Disposition: A | Discharge status: Home / Self Care | Payer: Self-pay | Source: Ambulatory Visit | Attending: Obstetrics and Gynecology | Admitting: Obstetrics and Gynecology

## 2016-10-03 ENCOUNTER — Ambulatory Visit
Admission: RE | Admit: 2016-10-03 | Discharge: 2016-10-03 | Disposition: A | Discharge status: Home / Self Care | Payer: No Typology Code available for payment source | Source: Ambulatory Visit | Attending: Obstetrics and Gynecology | Admitting: Obstetrics and Gynecology

## 2016-10-03 ENCOUNTER — Encounter (HOSPITAL_COMMUNITY): Payer: Self-pay

## 2016-10-03 VITALS — BP 110/64 | Temp 98.4°F | Ht 62.75 in | Wt 197.6 lb

## 2016-10-03 DIAGNOSIS — Z01419 Encounter for gynecological examination (general) (routine) without abnormal findings: Secondary | ICD-10-CM

## 2016-10-03 DIAGNOSIS — Z1231 Encounter for screening mammogram for malignant neoplasm of breast: Secondary | ICD-10-CM

## 2016-10-03 NOTE — Patient Instructions (Signed)
Explained breast self awareness with Kristeen Miss. Let patient know [facility] will cover Pap smears and HPV typing every 5 years unless has a history of abnormal Pap smears. Referred patient to the [facility] for a screening mammogram. Appointment scheduled for Tuesday, October 03, 2016 at 1540. Let patient know will follow up with her within the next couple weeks with results of Pap smear by phone. Informed patient that the [facility] will follow up with her within the next couple of weeks with results of mammogram by letter or phone. Kristeen Miss verbalized understanding.  Gurshaan Matsuoka, Arvil Chaco, RN 4:19 PM

## 2016-10-03 NOTE — Progress Notes (Signed)
No complaints today.   Pap Smear: Pap smear completed today. Last Pap smear was 20 years ago and normal per patient. Per patient has no history of an abnormal Pap smear. No Pap smear results are in EPIC.  Physical exam: Breasts Breasts symmetrical. No skin abnormalities bilateral breasts. No nipple retraction bilateral breasts. No nipple discharge bilateral breasts. No lymphadenopathy. No lumps palpated bilateral breasts. No complaints of pain or tenderness on exam. Referred patient to the [facility] for a screening mammogram. Appointment scheduled for Tuesday, October 03, 2016 at 1540.  Pelvic/Bimanual   Ext Genitalia No lesions, no swelling and no discharge observed on external genitalia.         Vagina Vagina pink and normal texture. No lesions or discharge observed in vagina.          Cervix Cervix is present. Cervix pink and of normal texture. No discharge observed.     Uterus Uterus is present and palpable. Uterus in normal position and normal size.        Adnexae Bilateral ovaries present and palpable. No tenderness on palpation.          Rectovaginal No rectal exam completed today since patient had no rectal complaints. No skin abnormalities observed on exam.    Smoking History: Patient has never smoked.  Patient Navigation: Patient education provided. Access to services provided for patient through [facility] program.   Colorectal Cancer Screening: Per patient had a colonoscopy completed 07/12/2016. No complaints today.

## 2016-10-06 ENCOUNTER — Encounter (HOSPITAL_COMMUNITY): Payer: Self-pay | Admitting: *Deleted

## 2016-10-06 LAB — CYTOLOGY - PAP
Diagnosis: NEGATIVE
HPV: NOT DETECTED

## 2016-10-09 ENCOUNTER — Telehealth (HOSPITAL_COMMUNITY): Payer: Self-pay | Admitting: *Deleted

## 2016-10-09 NOTE — Telephone Encounter (Signed)
Telephoned patient at home number and advised pap smear was normal. HPV was negative. Next pap smear due in five years. Patient voiced understanding.

## 2016-10-18 MED FILL — ATORVASTATIN 80 MG TABLET: 80 | 30 days supply | Qty: 30 | Fill #1

## 2016-10-24 ENCOUNTER — Ambulatory Visit (INDEPENDENT_AMBULATORY_CARE_PROVIDER_SITE_OTHER): Payer: Self-pay | Admitting: Internal Medicine

## 2016-10-24 ENCOUNTER — Encounter: Payer: Self-pay | Admitting: Internal Medicine

## 2016-10-24 VITALS — BP 134/80 | HR 74 | Temp 97.8°F | Ht 64.0 in | Wt 199.7 lb

## 2016-10-24 DIAGNOSIS — I10 Essential (primary) hypertension: Secondary | ICD-10-CM

## 2016-10-24 DIAGNOSIS — Z7984 Long term (current) use of oral hypoglycemic drugs: Secondary | ICD-10-CM

## 2016-10-24 DIAGNOSIS — E1169 Type 2 diabetes mellitus with other specified complication: Secondary | ICD-10-CM

## 2016-10-24 DIAGNOSIS — E039 Hypothyroidism, unspecified: Secondary | ICD-10-CM

## 2016-10-24 DIAGNOSIS — Z79899 Other long term (current) drug therapy: Secondary | ICD-10-CM

## 2016-10-24 DIAGNOSIS — Z87891 Personal history of nicotine dependence: Secondary | ICD-10-CM

## 2016-10-24 DIAGNOSIS — E784 Other hyperlipidemia: Secondary | ICD-10-CM

## 2016-10-24 DIAGNOSIS — Z0000 Encounter for general adult medical examination without abnormal findings: Secondary | ICD-10-CM

## 2016-10-24 DIAGNOSIS — E785 Hyperlipidemia, unspecified: Secondary | ICD-10-CM

## 2016-10-24 DIAGNOSIS — R5383 Other fatigue: Secondary | ICD-10-CM | POA: Insufficient documentation

## 2016-10-24 DIAGNOSIS — E119 Type 2 diabetes mellitus without complications: Secondary | ICD-10-CM

## 2016-10-24 LAB — GLUCOSE, CAPILLARY: GLUCOSE-CAPILLARY: 140 mg/dL — AB (ref 65–99)

## 2016-10-24 LAB — POCT GLYCOSYLATED HEMOGLOBIN (HGB A1C): Hemoglobin A1C: 6.2

## 2016-10-24 MED ORDER — METFORMIN HCL 1000 MG PO TABS: 1000.0000 mg | ORAL_TABLET | Freq: Every day | ORAL | 3 refills | Status: DC

## 2016-10-24 NOTE — Patient Instructions (Addendum)
It was an absolute pleasure seeing you again today Laurie Michael! I'm glad you are doing well.  1. Today we talked about your diabetes. You have been doing an excellent job with taking your medicines and with changing your diet! Please continue taking Metformin 1000 mg daily. Please stop taking Glipizide 2.5 mg daily. Today your HbA1c was 6.2%!!! 2. Today we also talked about your high blood pressure. Thank you for taking HCTZ 12.5 mg daily as directed. Your blood pressure today was perfect! Please continue your current regimen. 3. Today we also talked about your high cholesterol. Please continue taking Atorvastatin 80 mg. Due to your high cholesterol, diabetes and high blood pressure, you are at risk for cardiac or cerebrovascular disease.  4. You are up to date with all of your health maintenance (mammogram, PAP, colonoscopy)!!!! There are 2 more blood tests to complete your screening, including HIV and Hepatitis C screens. These are routine screening tests that we try to do in all patients your age.  5. Please come back and see me in clinic in 6 months!

## 2016-10-24 NOTE — Progress Notes (Signed)
   CC: routine follow up of diabetes, hypertension and hyperlipidemia. New complaint of fatigue.   HPI:  Laurie Michael is a very pleasant 66 y.o. female with medical history as outlined below who presents to clinic today for follow-up of her chronic medical issues. She reports some issues getting her prescriptions at her pharmacy and was told to discuss with [facility].   Fatigue: Patient reports at end of visit a several month history of worsening fatigue. This is accompanied by dry skin, cold intolerance, feeling like her hair is falling out and weight gain as well despite little to no appetite.   Type 2 Diabetes: Most recent HbA1c 6.0% 07/2016. She reports compliance with Metformin 1000mg  daily and Glipizide 2.5 mg. Denies any hypoglycemic episodes. Glipizide was added 02/2016 after she wanted to decrease the amount of metformin she was taking. Was reduced to 2.5 mg 04/2016 due to complaints of symptomatic hypoglycemia.   HTN: Takes HCTZ 12.5 mg daily. Was well controlled at her visit with OB/GYN however its not uncommon for her to be slightly hypertensive in the 140's-150's. She had a normal microalb-cr ratio 02/2016. This will need to be repeated in aprox 6 months.   HLD: Most recent cholesterol panel from 02/2016 showed elevated total cholesterol at 241, elevated LDL at 128, HDL of 38 and TG at 369. She was subsequently started on Atorvastatin 80 mg daily as her ASCVD risk with these values is 19%.  Health maintenance: In regards to her health maintenance, she is fully up to date on mammogram, PAP (09/2016), colonoscopy, diabetic foot exam and eye exams. She has not had HIV nor HepC screening.   Past Medical History:  Diagnosis Date  . Complication of anesthesia   . Diabetes mellitus without complication ([place])   . Family history of adverse reaction to anesthesia    mom is slow to wake up  . Hypertension   . Irritable bowel   . Obesity   . PONV (postoperative nausea and vomiting)     Review of Systems:  Review of Systems  Constitutional: Positive for malaise/fatigue. Negative for chills, fever and weight loss.  Respiratory: Negative for cough, shortness of breath and wheezing.   Cardiovascular: Negative for chest pain and palpitations.  Gastrointestinal: Positive for constipation. Negative for abdominal pain, diarrhea, nausea and vomiting.  Skin: Positive for itching (dry skin).  Neurological: Positive for weakness (muscle).   Physical Exam: Physical Exam  Constitutional: She appears well-developed and well-nourished.  Appears to have gained   HENT:  Head: Normocephalic and atraumatic.  Cardiovascular: Normal rate, regular rhythm and normal heart sounds.  Exam reveals no friction rub.   No murmur heard. Pulmonary/Chest: Effort normal and breath sounds normal. No respiratory distress. She has no wheezes. She has no rales.  Abdominal: Soft. Bowel sounds are normal. She exhibits no distension. There is no tenderness. There is no guarding.  Skin: Skin is warm and dry.  Psychiatric: She has a normal mood and affect. Her behavior is normal.    Vitals:   10/24/16 1351  BP: 134/80  Pulse: 74  Temp: 97.8 F (36.6 C)  TempSrc: Oral  SpO2: 99%  Weight: 199 lb 11.2 oz (90.6 kg)  Height: 5\' 4"  (1.626 m)   Assessment & Plan:   See Encounters Tab for problem based charting.  Patient discussed with Dr. Dareen Piano

## 2016-10-25 LAB — HEPATITIS C ANTIBODY: Hep C Virus Ab: <0.1 s/co ratio (ref 0.0–0.9)

## 2016-10-25 LAB — LDL CHOLESTEROL, DIRECT: LDL Direct: 79 mg/dL (ref 0–99)

## 2016-10-25 LAB — HIV ANTIBODY (ROUTINE TESTING W REFLEX): HIV SCREEN 4TH GENERATION: NONREACTIVE

## 2016-10-25 LAB — TSH: TSH: 5.17 u[IU]/mL — ABNORMAL HIGH (ref 0.450–4.500)

## 2016-10-25 MED ORDER — LEVOTHYROXINE SODIUM 50 MCG PO TABS: 25.0000 ug | ORAL_TABLET | Freq: Every day | ORAL | 1 refills | Status: DC

## 2016-10-26 NOTE — Assessment & Plan Note (Signed)
Laurie Michael complains of a several month history of fatigue. Endorses fatigue even after a good nights rest. She does not snore. She also endorses several pound weight gain despite little to no appetite. ROS was positive for dry skin, cold intolerance, hair loss. Denies any thyroid problems in the past although unsure if shes ever been evaluated for this.  -Check TSH today  ADDENDUM: TSH 5.2. This, in the setting of hypothyroid symptoms, points towards thyroid supplementation in this patient. There is some debate amongst "normal" TSH values (I.e.: not treating patients who are <10 vs adjusting normal TSH to be <2.5).  I believe she would benefit from a trial low-dose thyroid hormone daily. We will need to have the patient back in 6 weeks for TSH f/u.  -Synthroid 25 mcg daily called into pharmacy -She was instructed to follow-up in clinic in 6 weeks for re-evaluation.  -Called 3/15 to see how she was doing with new medicine however no answer. Asked her to call clinic back if she had questions or was experiencing adverse effects.

## 2016-10-26 NOTE — Assessment & Plan Note (Signed)
LDL 128 02/2016 at which time she was started on Atorvastatin 80 mg due to her high ASCVD risk. Will repeat LDL today to evaluate for efficacy. Expect a 50% decrease in LDL.  -LDL   ADDENDUM: LDL in 70s! Will continue Atorvastatin 80 mg.

## 2016-10-26 NOTE — Assessment & Plan Note (Signed)
Her most recent HbA1c 07/2016 showed excellent control at 6%. She reports she is taking her Metformin 1000 mg daily however occasionally takes Glipizide 2.5 mg when her blood sugar is 140+ in the AM. Usually these are between 120-130. She denies any recent symptomatic hypoglycemia.  -HbA1c today in clinic is 6.2%! -Discouraged use of Glipizide out of concern for hypoglycemia in this well-controlled diabetic -Follow up 3-6 months

## 2016-10-26 NOTE — Assessment & Plan Note (Signed)
The patient has done an excellent job on keeping up with her health maintenance. She recently had a mammogram (normal), PAP (normal) and colonoscopy (normal but 2 polyps). She has never had Hepatitis C screening nor HIV screening. She is agreeable to these today.  -HIV screen -Hep C screen  ADDENDUM: Both negative. UTD on screening.

## 2016-10-31 NOTE — Progress Notes (Signed)
Internal Medicine Clinic Attending  Case discussed with Dr. Molt at the time of the visit.  We reviewed the resident's history and exam and pertinent patient test results.  I agree with the assessment, diagnosis, and plan of care documented in the resident's note. 

## 2016-11-20 MED FILL — ATORVASTATIN 80 MG TABLET: 80 | 30 days supply | Qty: 30 | Fill #2

## 2016-11-24 ENCOUNTER — Ambulatory Visit (INDEPENDENT_AMBULATORY_CARE_PROVIDER_SITE_OTHER): Payer: Self-pay | Admitting: Internal Medicine

## 2016-11-24 VITALS — BP 129/71 | HR 74 | Temp 97.8°F | Ht 64.0 in | Wt 198.0 lb

## 2016-11-24 DIAGNOSIS — R5383 Other fatigue: Secondary | ICD-10-CM

## 2016-11-24 DIAGNOSIS — R946 Abnormal results of thyroid function studies: Secondary | ICD-10-CM

## 2016-11-24 DIAGNOSIS — Z5189 Encounter for other specified aftercare: Secondary | ICD-10-CM

## 2016-11-24 NOTE — Assessment & Plan Note (Signed)
[name] was seen in clinic on 3/13 with complaints of several month history of fatigue. Her TSH was checked at that time and noted to be mildly elevated at 5.2 and was started on Levothyroxine 25 mcg daily. Presents today for follow up.  Reports no change in symptoms. Was noted to have complaints of dry skin, cold intolerance and brittle hair/hair loss at last visit; denies any of these symptoms today. Appears to be most concerned with being unable to lose weight. Reports she had been up to 250 lbs and lost to about 195 lbs and has been unable to lose any additional weight in the past 6 months. Does report walking on her treadmill for an hour every night. Reports she continues to drink soft drinks with very little water intake. Does note and increased appetite since starting the syntrhoid. Still having significant fatigue. Denies any snoring or difficulty with sleep but does note that she has to take a nap daily.   Assessment: Fatigue  Plan: Mildly elevated TSH at last visit. With little improvement in symptoms believe it is unlikely to be the source of her symptoms. Checking TSH today as well as free T4. No recent CBCs on file and will check this today as well.

## 2016-11-24 NOTE — Progress Notes (Signed)
   CC: Fatigue/Low TSH follow up  HPI:  [name] is a 66 y.o. female with a past medical history listed below here today for follow up of her low TSH.  For details of today's visit and the status of her chronic medical issues please refer to the assessment and plan.   Past Medical History:  Diagnosis Date  . Complication of anesthesia   . Diabetes mellitus without complication ([place])   . Family history of adverse reaction to anesthesia    mom is slow to wake up  . Hypertension   . Irritable bowel   . Obesity   . PONV (postoperative nausea and vomiting)     Review of Systems:   See HPI  Physical Exam:  Vitals:   11/24/16 1316  BP: 129/71  Pulse: 74  Temp: 97.8 F (36.6 C)  TempSrc: Oral  SpO2: 98%  Weight: 198 lb (89.8 kg)  Height: 5\' 4"  (1.626 m)   Physical Exam  Constitutional: She is well-developed, well-nourished, and in no distress. No distress.  Neck: No thyromegaly present.  Cardiovascular: Normal rate and regular rhythm.   Pulmonary/Chest: Effort normal and breath sounds normal.  Musculoskeletal: She exhibits no edema.  Vitals reviewed.   Assessment & Plan:   See Encounters Tab for problem based charting.  Patient discussed with Dr. Angelia Mould

## 2016-11-24 NOTE — Patient Instructions (Signed)
Laurie Michael,  We will re-check you thyroid function today. Continue the medication for now and we will let you know the results.   Follow up with Dr. Danford Bad in 1-2 months.

## 2016-11-25 LAB — CBC
Hematocrit: 45.5 % (ref 34.0–46.6)
Hemoglobin: 15.5 g/dL (ref 11.1–15.9)
MCH: 30.8 pg (ref 26.6–33.0)
MCHC: 34.1 g/dL (ref 31.5–35.7)
MCV: 91 fL (ref 79–97)
PLATELETS: 339 10*3/uL (ref 150–379)
RBC: 5.03 x10E6/uL (ref 3.77–5.28)
RDW: 13.8 % (ref 12.3–15.4)
WBC: 9.1 10*3/uL (ref 3.4–10.8)

## 2016-11-25 LAB — T4, FREE: Free T4: 1.04 ng/dL (ref 0.82–1.77)

## 2016-11-25 LAB — TSH: TSH: 4.6 u[IU]/mL — ABNORMAL HIGH (ref 0.450–4.500)

## 2016-11-27 ENCOUNTER — Encounter: Payer: Self-pay | Admitting: Internal Medicine

## 2016-11-27 NOTE — Progress Notes (Signed)
Your thyroid levels are a little improved from before and I do not think we need to make any changes. You are a little above the 'normal' range but this is expected for individuals over 65 years old. We will continue the Synthroid dose at 25 mcg. The rest of your blood work was normal. Please follow up with Dr. Danford Bad.

## 2016-11-28 NOTE — Progress Notes (Signed)
Internal Medicine Clinic Attending  Case discussed with Dr. Charlynn Grimes at the time of the visit.  We reviewed the resident's history and exam and pertinent patient test results.  I agree with the assessment, diagnosis, and plan of care documented in the resident's note. I am not completely convinced her fatigue is due to hypothyroisim given that her TSH was only 5.  However it would be important to check a free T4 in the evaluation of subclinical hypothyroisism.  She was started on a very small dose of synthroid (68mcg).  We will repeat a TSH and free T4 today, it appears this has come back and her TSH is now ~4 with a normal Free T4.  She may continue her current dose of synthroid. As far as her fatigue, I think it is important to rule out anemia as a cause and we have obtained a CBC.  Her Hgb is normal.  Other potential etiology includes deconditioning.  I would encourage her to continue to exercise.

## 2016-12-16 ENCOUNTER — Other Ambulatory Visit: Payer: Self-pay | Admitting: Internal Medicine

## 2016-12-19 ENCOUNTER — Other Ambulatory Visit: Payer: Self-pay

## 2016-12-19 NOTE — Telephone Encounter (Signed)
hydrochlorothiazide (HYDRODIURIL) 12.5 MG tablet, is not at the pharmcy. Requesting refill.

## 2016-12-19 NOTE — Telephone Encounter (Signed)
Call from patient stating rx for HCTZ was not pharmacy-per pt's record, refill was sent to [facility] on [address] yesterday.  Call made to [facility] to confirm rx was received-rx there and waiting to be picked up, pt aware.  No further action needed, phone call complete.Despina Hidden Cassady5/8/201812:18 PM

## 2017-01-01 ENCOUNTER — Encounter: Payer: Self-pay | Admitting: *Deleted

## 2017-01-01 MED FILL — ATORVASTATIN 80 MG TABLET: 80 | 30 days supply | Qty: 30 | Fill #3

## 2017-01-30 ENCOUNTER — Encounter: Payer: Self-pay | Admitting: Internal Medicine

## 2017-01-30 ENCOUNTER — Ambulatory Visit (INDEPENDENT_AMBULATORY_CARE_PROVIDER_SITE_OTHER): Payer: Self-pay | Admitting: Internal Medicine

## 2017-01-30 VITALS — BP 135/78 | HR 75 | Temp 97.8°F | Ht 63.0 in | Wt 204.5 lb

## 2017-01-30 DIAGNOSIS — E785 Hyperlipidemia, unspecified: Secondary | ICD-10-CM

## 2017-01-30 DIAGNOSIS — R5383 Other fatigue: Secondary | ICD-10-CM

## 2017-01-30 DIAGNOSIS — E1169 Type 2 diabetes mellitus with other specified complication: Secondary | ICD-10-CM

## 2017-01-30 DIAGNOSIS — E669 Obesity, unspecified: Secondary | ICD-10-CM | POA: Insufficient documentation

## 2017-01-30 DIAGNOSIS — E039 Hypothyroidism, unspecified: Secondary | ICD-10-CM | POA: Insufficient documentation

## 2017-01-30 DIAGNOSIS — G5603 Carpal tunnel syndrome, bilateral upper limbs: Secondary | ICD-10-CM

## 2017-01-30 DIAGNOSIS — Z6836 Body mass index (BMI) 36.0-36.9, adult: Secondary | ICD-10-CM

## 2017-01-30 DIAGNOSIS — E119 Type 2 diabetes mellitus without complications: Secondary | ICD-10-CM

## 2017-01-30 DIAGNOSIS — I10 Essential (primary) hypertension: Secondary | ICD-10-CM

## 2017-01-30 DIAGNOSIS — E784 Other hyperlipidemia: Secondary | ICD-10-CM

## 2017-01-30 DIAGNOSIS — Z7984 Long term (current) use of oral hypoglycemic drugs: Secondary | ICD-10-CM

## 2017-01-30 DIAGNOSIS — Z87891 Personal history of nicotine dependence: Secondary | ICD-10-CM

## 2017-01-30 DIAGNOSIS — Z79899 Other long term (current) drug therapy: Secondary | ICD-10-CM

## 2017-01-30 LAB — GLUCOSE, CAPILLARY: GLUCOSE-CAPILLARY: 84 mg/dL (ref 65–99)

## 2017-01-30 LAB — POCT GLYCOSYLATED HEMOGLOBIN (HGB A1C): Hemoglobin A1C: 6.4

## 2017-01-30 MED ORDER — ATORVASTATIN CALCIUM 80 MG PO TABS: 80.0000 mg | ORAL_TABLET | Freq: Every day | ORAL | 3 refills | Status: DC

## 2017-01-30 MED ORDER — METFORMIN HCL 1000 MG PO TABS: 1000.0000 mg | ORAL_TABLET | Freq: Every day | ORAL | 3 refills | Status: DC

## 2017-01-30 MED ORDER — HYDROCHLOROTHIAZIDE 12.5 MG PO CAPS: ORAL_CAPSULE | ORAL | 2 refills | Status: DC

## 2017-01-30 MED ORDER — LEVOTHYROXINE SODIUM 50 MCG PO TABS: 50.0000 ug | ORAL_TABLET | Freq: Every day | ORAL | 3 refills | Status: DC

## 2017-01-30 NOTE — Patient Instructions (Addendum)
It was a pleasure seeing you again today! I'm glad you are doing well.   Today we talked about diabetes. Your hemoglobin A1c today was 6.4%. This was about the same as your most recent one. You are doing a great job with taking your Metformin. You were also doing a great job with exercise. I understand you are getting a new treadmill and I encourage you to resume your work-out routine then! Until then, you could try walking in a safe neighborhood or park.   Today we also talked about your hand numbness. This seems to occur when you are using your hands and gets better with rest. Your story is consistent with carpel tunnel syndrome. For this, there are lifestyle modifications that we can make to try to improve this. Please follow the handout I have provided for you.   Please come back in 3-6 months to recheck your hemoglobin A1c, TSH and see how your numbness is doing.    Carpal Tunnel Syndrome Carpal tunnel syndrome is a condition that causes pain in your hand and arm. The carpal tunnel is a narrow area located on the palm side of your wrist. Repeated wrist motion or certain diseases may cause swelling within the tunnel. This swelling pinches the main nerve in the wrist (median nerve). What are the causes? This condition may be caused by:  Repeated wrist motions.  Wrist injuries.  Arthritis.  A cyst or tumor in the carpal tunnel.  Fluid buildup during pregnancy.  Sometimes the cause of this condition is not known. What increases the risk? This condition is more likely to develop in:  People who have jobs that cause them to repeatedly move their wrists in the same motion, such as Art gallery manager.  Women.  People with certain conditions, such as: ? Diabetes. ? Obesity. ? An underactive thyroid (hypothyroidism). ? Kidney failure.  What are the signs or symptoms? Symptoms of this condition include:  A tingling feeling in your fingers, especially in your thumb, index, and  middle fingers.  Tingling or numbness in your hand.  An aching feeling in your entire arm, especially when your wrist and elbow are bent for long periods of time.  Wrist pain that goes up your arm to your shoulder.  Pain that goes down into your palm or fingers.  A weak feeling in your hands. You may have trouble grabbing and holding items.  How is this treated? Treatment for this condition includes:  Lifestyle changes. It is important to stop doing or modify the activity that caused your condition.  Decrease repetitive movements  Stop repetitive movements and rest when you develop numbness.   Medicines for pain and inflammation. This may include medicine that is injected into your wrist.  A wrist splint.  Physical or occupational therapy.  Surgery.  General instructions  Take over-the-counter and prescription medicines only as told by your health care provider.  Rest your wrist from any activity that may be causing your pain. If your condition is work related, talk to your employer about changes that can be made, such as getting a wrist pad to use while typing.  If directed, apply ice to the painful area: ? Put ice in a plastic bag. ? Place a towel between your skin and the bag. ? Leave the ice on for 20 minutes, 2-3 times per day.  Keep all follow-up visits as told by your health care provider. This is important.  Do any exercises as told by your health care  provider, physical therapist, or occupational therapist. Contact a health care provider if:  You have new symptoms.  Your pain is not controlled with medicines.  Your symptoms get worse. This information is not intended to replace advice given to you by your health care provider. Make sure you discuss any questions you have with your health care provider. Document Released: 07/28/2000 Document Revised: 12/09/2015 Document Reviewed: 12/16/2014 Elsevier Interactive Patient Education  2017 [employer].

## 2017-01-30 NOTE — Assessment & Plan Note (Signed)
Compliant with atorvastatin 80 mg daily. No complaints of myalgias. -Refill given for atorvastatin 80 mg

## 2017-01-30 NOTE — Assessment & Plan Note (Signed)
Patient has been taking Synthroid 25 g once daily. Most recent couple of TSHs have been elevated however dose was not increased. She continues to complain of fatigue and constipation. We'll do trial of increased Synthroid and follow-up symptoms and TSH. Patient instructed she experiences any adverse effects to call clinic and take 25 g once daily instead. -Increase Synthroid to 50 g once daily -Follow-up TSH at next clinic appointment

## 2017-01-30 NOTE — Assessment & Plan Note (Addendum)
Hemoglobin A1c 6.4% today and was 6.2% on prior check. She notes compliance with metformin 1000 mg total daily and checks her blood sugars intermittently using her husband's glucometer. She notes worsening fasting blood sugars in setting of her treadmill being broken unable to exercise as usual. In process of buying new treadmill. -Continue metformin 1000 mg -Follow-up hemoglobin A1c next clinic appointment -Check urine microalbumin today

## 2017-01-30 NOTE — Assessment & Plan Note (Signed)
6 pound weight gain over the past 3 months. Patient attributes this to treadmill being broken and is currently working on getting a new one.  -Encouraged patient to continue exercising such as walking around a safe neighborhood, joining gym or walking at a local track -Encouraged continued compliance with diet

## 2017-01-30 NOTE — Assessment & Plan Note (Signed)
She's mentioned this complaint and passing during one of her prior visits however this is been persistent for several months and patient is interested in lifestyle modifications to help reduce the symptoms. She does have risk factors for this including type 2 diabetes, obesity and hypothyroidism and physical exam correlates as well with positive Phalen test and some bilateral thenar wasting. -Provided patient with educational materials on lifestyle modifications -Encouraged patient that weight loss, and better control of her diabetes and hypothyroidism will assist this

## 2017-01-30 NOTE — Progress Notes (Signed)
   CC: Follow up of type 2 diabetes, hypertension, hypothyroidism and complaint of BL hand numbness  HPI:  [name] is a very pleasant 66 y.o. female with hypertension, type 2 diabetes, hypothyroidism and obesity who presents for follow-up of these chronic medical conditions. She also has complaint of intermittent bilateral hand numbness.  Type 2 diabetes: Hemoglobin A1c 6.4% today. Reports compliance with metformin 1000 mg daily. Checks blood sugars with husband's glucometer, little elevated recently in the 130s to 140s fasting however patient notes her treadmills broken unable to exercise as usual.  Hypertension: Compliant with her hydrochlorothiazide 25 mg once daily. Occasionally checks blood pressure at home, denies any readings greater than 140/90.  Hypothyroidism: Take Synthroid 25 mcg once daily. Most recent TSH 4.62 months ago at which time she was complaining of persistent fatigue and constipation.  Obesity: She notes a 6 pound weight gain since her last visit due to her treadmill being broken. In process of buying another. Looking forward to continue to exercise.  Bilateral hand numbness: This is been persistent for several months. She notices this most when she is using her phone, reading books, using the computer or gardening. This improves with rest or sometimes goes away on its own. Has not tried any over-the-counter remedies.  Past Medical History:  Diagnosis Date  . Complication of anesthesia   . Diabetes mellitus without complication ([place])   . Family history of adverse reaction to anesthesia    mom is slow to wake up  . Hypertension   . Irritable bowel   . Obesity   . PONV (postoperative nausea and vomiting)    Review of Systems:  Review of Systems  Constitutional: Positive for malaise/fatigue. Negative for chills, fever and weight loss (weight gain, treadmill not working).  Eyes: Negative for blurred vision and double vision.  Respiratory: Negative for  shortness of breath and wheezing.   Cardiovascular: Negative for chest pain and palpitations.  Gastrointestinal: Positive for constipation. Negative for abdominal pain, nausea and vomiting.  Musculoskeletal: Positive for joint pain.  Neurological: Positive for tingling (bilatearl hands, intermittent).   Physical Exam: Physical Exam  Constitutional: She appears well-developed and well-nourished. No distress.  HENT:  Head: Normocephalic and atraumatic.  Mouth/Throat: No oropharyngeal exudate.  Cardiovascular: Normal rate, regular rhythm and normal heart sounds.   No murmur heard. Pulmonary/Chest: Effort normal and breath sounds normal. No respiratory distress. She has no wheezes. She has no rales.  Abdominal: Soft. Bowel sounds are normal. She exhibits no distension. There is no tenderness.  Musculoskeletal:  +Phalens sign after 30 seconds. Symptoms resolved with resting wrists in neutral position.  Wrists without TTP, edema, erythema or obvious deformity.  Palm of hand did demonstrate mild BL thenar wasting.  Strength and sensation intact.   Skin: She is not diaphoretic.  Psychiatric: She has a normal mood and affect. Her behavior is normal.   Vitals:   01/30/17 1536  BP: 135/78  Pulse: 75  Temp: 97.8 F (36.6 C)  TempSrc: Oral  SpO2: 100%  Weight: 204 lb 8 oz (92.8 kg)  Height: 5\' 3"  (1.6 m)    Assessment & Plan:   See Encounters Tab for problem based charting.  Patient discussed with Dr. Lynnae January

## 2017-01-31 LAB — MICROALBUMIN / CREATININE URINE RATIO: Creatinine, Urine: 72.5 mg/dL

## 2017-01-31 NOTE — Progress Notes (Signed)
Internal Medicine Clinic Attending  Case discussed with Dr. Molt at the time of the visit.  We reviewed the resident's history and exam and pertinent patient test results.  I agree with the assessment, diagnosis, and plan of care documented in the resident's note. 

## 2017-02-13 ENCOUNTER — Telehealth: Payer: Self-pay | Admitting: Internal Medicine

## 2017-02-13 NOTE — Telephone Encounter (Signed)
CALLED PATIENT LMTCB, IT IS TIME TO RENEW [facility] CARD

## 2017-02-15 ENCOUNTER — Other Ambulatory Visit: Payer: Self-pay

## 2017-02-15 DIAGNOSIS — E785 Hyperlipidemia, unspecified: Principal | ICD-10-CM

## 2017-02-15 DIAGNOSIS — E1169 Type 2 diabetes mellitus with other specified complication: Secondary | ICD-10-CM

## 2017-02-15 NOTE — Telephone Encounter (Signed)
atorvastatin (LIPITOR) 80 MG tablet, refill request @ outpatient pharmacy. Please call pt back.

## 2017-02-16 ENCOUNTER — Other Ambulatory Visit: Payer: Self-pay

## 2017-02-16 MED ORDER — ATORVASTATIN CALCIUM 80 MG PO TABS: 80.0000 mg | ORAL_TABLET | Freq: Every day | ORAL | 1 refills | Status: DC

## 2017-02-16 MED FILL — ATORVASTATIN 80 MG TABLET: 80 | 30 days supply | Qty: 30 | Fill #0

## 2017-02-16 NOTE — Telephone Encounter (Signed)
Per patient atorvastatin (LIPITOR) 80 MG tablet, is under the wrong code for $4.00. Pt is at the pharmacy right now, requesting the nurse to call back as soon as possible.

## 2017-02-16 NOTE — Telephone Encounter (Signed)
Called pharmacy, added IM program to scripts

## 2017-02-28 ENCOUNTER — Ambulatory Visit: Payer: Self-pay

## 2017-03-29 MED FILL — ATORVASTATIN 80 MG TABLET: 80 | 30 days supply | Qty: 30 | Fill #1

## 2017-05-01 MED FILL — ATORVASTATIN 80 MG TABLET: 80 | 30 days supply | Qty: 30 | Fill #2

## 2017-06-05 MED FILL — ATORVASTATIN 80 MG TABLET: 80 | 30 days supply | Qty: 30 | Fill #3

## 2017-06-18 ENCOUNTER — Other Ambulatory Visit: Payer: Self-pay | Admitting: Internal Medicine

## 2017-06-18 DIAGNOSIS — I10 Essential (primary) hypertension: Secondary | ICD-10-CM

## 2017-07-09 MED FILL — ATORVASTATIN 80 MG TABLET: 80 | 30 days supply | Qty: 30 | Fill #4

## 2017-07-13 ENCOUNTER — Other Ambulatory Visit: Payer: Self-pay | Admitting: *Deleted

## 2017-07-13 NOTE — Patient Outreach (Signed)
[facility]) Care Management  07/13/2017  KRISHIKA BUGGE 05/20/1951 [id number]   Health Risk Assessment screening  Unsuccessful outreach call to patient. Will plan outreach call on next week.    Joylene Draft, RN, [facility] Management Coordinator  [phone]- Mobile [phone]- Toll Free Main Office

## 2017-07-16 ENCOUNTER — Other Ambulatory Visit: Payer: Self-pay | Admitting: *Deleted

## 2017-07-16 NOTE — Patient Outreach (Signed)
[facility]) Care Management  07/16/2017  Laurie Michael Dec 29, 1950 [id number]  Health Risk Assessment screening call #2   Unsuccessful outreach call to patient, able to leave a HIPAA compliant message requesting a return call . Will plan return within this week, if no response.    Joylene Draft, RN, [place] Management Coordinator  [phone]- Mobile [phone]- Toll Free Main Office

## 2017-07-17 ENCOUNTER — Other Ambulatory Visit: Payer: Self-pay

## 2017-07-17 ENCOUNTER — Encounter: Payer: Self-pay | Admitting: *Deleted

## 2017-07-17 ENCOUNTER — Other Ambulatory Visit: Payer: Self-pay | Admitting: *Deleted

## 2017-07-17 ENCOUNTER — Ambulatory Visit (INDEPENDENT_AMBULATORY_CARE_PROVIDER_SITE_OTHER): Payer: PPO | Admitting: Internal Medicine

## 2017-07-17 ENCOUNTER — Encounter: Payer: Self-pay | Admitting: Internal Medicine

## 2017-07-17 VITALS — BP 114/66 | HR 81 | Temp 97.7°F | Ht 63.0 in | Wt 215.0 lb

## 2017-07-17 DIAGNOSIS — E039 Hypothyroidism, unspecified: Secondary | ICD-10-CM

## 2017-07-17 DIAGNOSIS — E785 Hyperlipidemia, unspecified: Secondary | ICD-10-CM | POA: Diagnosis not present

## 2017-07-17 DIAGNOSIS — K5900 Constipation, unspecified: Secondary | ICD-10-CM

## 2017-07-17 DIAGNOSIS — Z87891 Personal history of nicotine dependence: Secondary | ICD-10-CM

## 2017-07-17 DIAGNOSIS — I10 Essential (primary) hypertension: Secondary | ICD-10-CM

## 2017-07-17 DIAGNOSIS — E1169 Type 2 diabetes mellitus with other specified complication: Secondary | ICD-10-CM | POA: Diagnosis not present

## 2017-07-17 DIAGNOSIS — Z23 Encounter for immunization: Secondary | ICD-10-CM | POA: Diagnosis not present

## 2017-07-17 DIAGNOSIS — Z79899 Other long term (current) drug therapy: Secondary | ICD-10-CM | POA: Diagnosis not present

## 2017-07-17 DIAGNOSIS — Z79890 Hormone replacement therapy (postmenopausal): Secondary | ICD-10-CM

## 2017-07-17 DIAGNOSIS — E2839 Other primary ovarian failure: Secondary | ICD-10-CM | POA: Diagnosis not present

## 2017-07-17 DIAGNOSIS — Z7984 Long term (current) use of oral hypoglycemic drugs: Secondary | ICD-10-CM

## 2017-07-17 DIAGNOSIS — E119 Type 2 diabetes mellitus without complications: Secondary | ICD-10-CM

## 2017-07-17 LAB — POCT GLYCOSYLATED HEMOGLOBIN (HGB A1C): Hemoglobin A1C: 6.9

## 2017-07-17 LAB — GLUCOSE, CAPILLARY: Glucose-Capillary: 186 mg/dL — ABNORMAL HIGH (ref 65–99)

## 2017-07-17 MED ORDER — DOCUSATE SODIUM 100 MG PO CAPS: 100.0000 mg | ORAL_CAPSULE | Freq: Two times a day (BID) | ORAL | 11 refills | Status: DC | PRN

## 2017-07-17 MED ORDER — HYDROCHLOROTHIAZIDE 25 MG PO TABS: 25.0000 mg | ORAL_TABLET | Freq: Every day | ORAL | 11 refills | Status: DC

## 2017-07-17 MED ORDER — ATORVASTATIN CALCIUM 80 MG PO TABS: 80.0000 mg | ORAL_TABLET | Freq: Every day | ORAL | 4 refills | Status: DC

## 2017-07-17 MED ORDER — LEVOTHYROXINE SODIUM 50 MCG PO TABS: 50.0000 ug | ORAL_TABLET | Freq: Every day | ORAL | 11 refills | Status: DC

## 2017-07-17 MED ORDER — METFORMIN HCL 1000 MG PO TABS: 1000.0000 mg | ORAL_TABLET | Freq: Every day | ORAL | 11 refills | Status: DC

## 2017-07-17 MED ORDER — GLIPIZIDE ER 2.5 MG PO TB24: 2.5000 mg | ORAL_TABLET | Freq: Every day | ORAL | 11 refills | Status: DC

## 2017-07-17 MED FILL — ATORVASTATIN 80 MG TABLET: 80 | 90 days supply | Qty: 90 | Fill #0

## 2017-07-17 NOTE — Patient Instructions (Addendum)
It was a pleasure seeing you today! I'm glad you are doing well.   Today we talked about constipation. I recommend a stool softener such as COLACE. This medication works differently than laxatives. Please take 1 cap once to twice daily until you have regular bowel movements. This is an over the counter medication and make sure the only active ingredient is DOCUSATE.   Today we talked about your diabetes. Your A1c was 6.9%! My goal for you is <7.5% so you are doing well. Please continue taking Metformin 1000mg  daily and Glipizide 2.5mg  daily.  Today we talked about weight gain. Weight gain happens and it sounds like you have a good idea of why its occurring. Please try to limit your PM snacking! Also please resume exercising when your treadmill is fixed! Until then you can walk around the neighborhood with a friend!  Today we talked about your blood pressure. You're doing great! I've sent in a prescription so you only have to take 1 blood pressure pill.

## 2017-07-17 NOTE — Assessment & Plan Note (Signed)
Received both the flu and pneumonia shot today. Tolerated well in clinic

## 2017-07-17 NOTE — Assessment & Plan Note (Signed)
Complains of constipation recently. Never had an issue with this before. She reports she eats essentially no fiber in her diet. Has tried no over-the-counter medications as she has had laxatives in the past and did not like the abdominal cramping associated with it. Make good discussion today on the importance of diet, exercise and adequate water intake to move the bowels. I also recommended a stool softener as well if needed.

## 2017-07-17 NOTE — Assessment & Plan Note (Signed)
Blood pressure continues to be well-controlled today. I have altered her regimen of hydrochlorothiazide 25 mg to be only 1 pill a day. -12 month refill sent of HCTZ 25 mg

## 2017-07-17 NOTE — Patient Outreach (Signed)
[facility]) Care Management  07/17/2017  Laurie Michael 1951/07/06 [id number]   Health Risk Assessment  Returned call from patient , explained reason for the call. Patient discussed her current medical condition of Diabetes, report she monitors blood sugar daily , recent A1c is 6.9, she reports has attended classes for diabetes,continues to take medications as prescribed.  Patient discussed attending regular PCP visits, eye exams. Patient reports she is doing well with taking care of self.  Patient denies any concerns or needs at this time.  Will send successful outreach letter with [facility] care management contact information.   Joylene Draft, RN, [place] Management Coordinator  [phone]- Mobile [phone]- Toll Free Main Office

## 2017-07-17 NOTE — Assessment & Plan Note (Signed)
We will order DEXA scan has patient turned 66 this year. She denies any bone pain or falls. -DEXA scan

## 2017-07-17 NOTE — Assessment & Plan Note (Addendum)
Her Synthroid was increased at her last visit to 50 g daily. She did not make this medication change and subsequently has not noticed a difference in her symptoms. Patient was counseled to make this change and call if symptoms persist. -Continue 50 g Synthroid daily -12 month supply sent to pharmacy

## 2017-07-17 NOTE — Progress Notes (Signed)
   CC: follow-up of diabetes, hyper-tension, hypothyroidism  HPI:  [name] is a 66 y.o. female with history of type 2 diabetes, hypertension, hypothyroidism and hyperlipidemia here for follow-up of these conditions. Please see below for details. She also has acute complaint of constipation.  DM2: Hemoglobin A1c today 6.9%, was 6.4% 01/2017. Reports compliance with metformin 1000 mg daily and glipizide 2.5 mg daily. She does report quite a bit of dietary indiscretion lately (eating at night) with subsequent weight gain. In addition she also states her treadmills broken and has been unable to exercise. She measures her blood sugar in the morning, has been in the 140s on average.  HTN: HCTZ 25 mg total daily. No complaints  Hypothyroidism: She was prescribed 50 mcg Synthroid at our last visit due to complains of fatigue and subsequent borderline high TSH. She did not make this medication adjustment and continues to feel fatigued. She complains of constipation today as well however has not tried any over-the-counter remedies.  Past Medical History:  Diagnosis Date  . Complication of anesthesia   . Diabetes mellitus without complication ([place])   . Family history of adverse reaction to anesthesia    mom is slow to wake up  . Hypertension   . Irritable bowel   . Obesity   . PONV (postoperative nausea and vomiting)    Review of Systems:   General: +fatigue. Denies fevers, chills, weight loss HEENT: Denies changes in vision, sore throat, dysphagia Cardiac: Denies CP, SOB, palpitations Pulmonary: Denies cough Abd: +Constipation. No hematochezia or melena. No abdominal pain. Extremities: Denies weakness or swelling  Physical Exam: General: Alert, in no acute distress. Pleasant and conversant HEENT: No icterus, injection or ptosis. No hoarseness or dysarthria  Cardiac: RRR, no MGR appreciated Pulmonary: CTA BL with normal WOB on RA. Able to speak in complete sentences Abd: Soft,  non-tender. +bs Extremities: +Good PT and DP pulses BL. Warm, perfused. No significant pedal edema.   Vitals:   07/17/17 1424  BP: 114/66  Pulse: 81  Temp: 97.7 F (36.5 C)  TempSrc: Oral  SpO2: 97%  Weight: 215 lb (97.5 kg)  Height: 5\' 3"  (1.6 m)   Assessment & Plan:   See Encounters Tab for problem based charting.  Patient discussed with Dr. Evette Doffing

## 2017-07-17 NOTE — Assessment & Plan Note (Signed)
She continues to be well controlled with a hemoglobin A1c today of 6.9%. She is still tolerating metformin 1000 mg daily as well as glipizide 2.5 mg daily. Reports average CBGs in the morning in the 140s. Does note she has had some issues with her diet and lack of exercise recently which she is working on fixing. -Foot exam performed today -Patient had eye exam approximately 6 months ago, will call to establish with Drs. Groat -Continue metformin 1000 mg and glipizide 2.5 mg daily

## 2017-07-17 NOTE — Assessment & Plan Note (Signed)
For primary prevention. Tolerating atorvastatin 80 mg well so far. She denies any myalgias or abdominal pain. -12 month supply of atorvastatin sent to pharmacy

## 2017-07-18 NOTE — Progress Notes (Signed)
Internal Medicine Clinic Attending  Case discussed with Dr. Molt at the time of the visit.  We reviewed the resident's history and exam and pertinent patient test results.  I agree with the assessment, diagnosis, and plan of care documented in the resident's note. 

## 2017-08-09 ENCOUNTER — Other Ambulatory Visit: Payer: Self-pay

## 2017-08-09 NOTE — Telephone Encounter (Signed)
Called pt, she [facility] no refills AVAILABLE, tri[facility], pharm states too early, pt will call or go to [facility] and speak w/ pharm

## 2017-08-09 NOTE — Telephone Encounter (Signed)
glipiZIDE (GLUCOTROL XL) 2.5 MG 24 hr tablet, Refill request @ [facility] on [address].

## 2017-08-15 ENCOUNTER — Other Ambulatory Visit: Payer: Self-pay

## 2017-08-15 DIAGNOSIS — E119 Type 2 diabetes mellitus without complications: Secondary | ICD-10-CM

## 2017-08-15 NOTE — Telephone Encounter (Signed)
Requesting test strips to be filled @ [facility] on [address].

## 2017-08-16 ENCOUNTER — Telehealth: Payer: Self-pay | Admitting: Internal Medicine

## 2017-08-16 ENCOUNTER — Other Ambulatory Visit: Payer: Self-pay | Admitting: *Deleted

## 2017-08-16 DIAGNOSIS — E119 Type 2 diabetes mellitus without complications: Secondary | ICD-10-CM

## 2017-08-16 MED ORDER — GLUCOSE BLOOD VI STRP: ORAL_STRIP | 3 refills | Status: DC

## 2017-08-16 NOTE — Telephone Encounter (Signed)
Patient is requesting test strip for her meter.  One touch Ultra Blue test strip. Patient is following up from yesterday, pls contact patient

## 2017-08-16 NOTE — Telephone Encounter (Signed)
Pls sch PCP appt DM F/U June or July

## 2017-08-17 ENCOUNTER — Other Ambulatory Visit: Payer: Self-pay | Admitting: *Deleted

## 2017-08-17 NOTE — Telephone Encounter (Signed)
Completed 1/3

## 2017-08-20 MED ORDER — GLUCOSE BLOOD VI STRP: ORAL_STRIP | 12 refills | Status: DC

## 2017-08-21 ENCOUNTER — Other Ambulatory Visit: Payer: Self-pay | Admitting: Internal Medicine

## 2017-08-21 DIAGNOSIS — Z1231 Encounter for screening mammogram for malignant neoplasm of breast: Secondary | ICD-10-CM

## 2017-10-04 ENCOUNTER — Ambulatory Visit
Admission: RE | Admit: 2017-10-04 | Discharge: 2017-10-04 | Disposition: A | Discharge status: Home / Self Care | Payer: PPO | Source: Ambulatory Visit | Attending: *Deleted | Admitting: *Deleted

## 2017-10-04 ENCOUNTER — Ambulatory Visit
Admission: RE | Admit: 2017-10-04 | Discharge: 2017-10-04 | Disposition: A | Discharge status: Home / Self Care | Payer: PPO | Source: Ambulatory Visit | Attending: Internal Medicine | Admitting: Internal Medicine

## 2017-10-04 DIAGNOSIS — E2839 Other primary ovarian failure: Secondary | ICD-10-CM

## 2017-10-04 DIAGNOSIS — Z1231 Encounter for screening mammogram for malignant neoplasm of breast: Secondary | ICD-10-CM

## 2017-10-04 DIAGNOSIS — M85852 Other specified disorders of bone density and structure, left thigh: Secondary | ICD-10-CM | POA: Diagnosis not present

## 2017-11-13 MED FILL — ATORVASTATIN 80 MG TABLET: 80 | 90 days supply | Qty: 90 | Fill #1

## 2017-11-29 ENCOUNTER — Telehealth: Payer: Self-pay | Admitting: *Deleted

## 2017-11-29 NOTE — Telephone Encounter (Signed)
Glipizide ER 2.5 mg #30 with 11 refills sent to [facility] on 07/17/2017. Patient is now requesting 90 day supply.  Hubbard Hartshorn, RN, BSN

## 2017-12-06 ENCOUNTER — Other Ambulatory Visit: Payer: Self-pay | Admitting: Internal Medicine

## 2017-12-06 NOTE — Telephone Encounter (Signed)
Would prefer determining her response to the dose at our scheduled visit 01/15/18 prior to sending in 90-day Rx. Thank you!

## 2017-12-27 ENCOUNTER — Other Ambulatory Visit: Payer: Self-pay | Admitting: *Deleted

## 2017-12-27 DIAGNOSIS — E119 Type 2 diabetes mellitus without complications: Secondary | ICD-10-CM

## 2017-12-29 MED ORDER — GLIPIZIDE ER 2.5 MG PO TB24: 2.5000 mg | ORAL_TABLET | Freq: Every day | ORAL | 0 refills | Status: DC

## 2018-01-15 ENCOUNTER — Encounter: Payer: PPO | Admitting: Internal Medicine

## 2018-01-16 ENCOUNTER — Telehealth: Payer: Self-pay | Admitting: Dietician

## 2018-01-17 DIAGNOSIS — N950 Postmenopausal bleeding: Secondary | ICD-10-CM | POA: Diagnosis not present

## 2018-01-17 DIAGNOSIS — Z79899 Other long term (current) drug therapy: Secondary | ICD-10-CM | POA: Diagnosis not present

## 2018-01-17 DIAGNOSIS — Z87891 Personal history of nicotine dependence: Secondary | ICD-10-CM | POA: Diagnosis not present

## 2018-01-17 DIAGNOSIS — E7800 Pure hypercholesterolemia, unspecified: Secondary | ICD-10-CM | POA: Diagnosis not present

## 2018-01-17 DIAGNOSIS — K5900 Constipation, unspecified: Secondary | ICD-10-CM | POA: Diagnosis not present

## 2018-01-17 DIAGNOSIS — I10 Essential (primary) hypertension: Secondary | ICD-10-CM | POA: Diagnosis not present

## 2018-01-17 DIAGNOSIS — E119 Type 2 diabetes mellitus without complications: Secondary | ICD-10-CM | POA: Diagnosis not present

## 2018-01-17 DIAGNOSIS — N390 Urinary tract infection, site not specified: Secondary | ICD-10-CM | POA: Diagnosis not present

## 2018-01-18 ENCOUNTER — Telehealth: Payer: Self-pay | Admitting: Internal Medicine

## 2018-01-18 NOTE — Telephone Encounter (Signed)
Patient went to ER had kidney infection, wanted to know if she needs to come in or there referred her to the GI, pls call  If patient doesn't answer her cell please call [phone]

## 2018-01-18 NOTE — Telephone Encounter (Signed)
Patient instructed by ED [place] to see GYN following UTI. Denies fever, c/o some rib pain but has been doing lots of yard work. Picked up antibiotic today. Will take 1 tab twice daily till finished. Instructed to keep appt with PCP next week so referral to GYN can be placed if indicated. Return to ED precautions discussed. Patient states understanding. Hubbard Hartshorn, RN, BSN

## 2018-01-18 NOTE — Telephone Encounter (Signed)
Attempted to reach patient to discuss recent ED visit. Line went directly to recording stating mailbox not set up yet.  Of note patient did schedule OV with PCP for 01/23/2018. Hubbard Hartshorn, RN, BSN

## 2018-01-18 NOTE — Telephone Encounter (Signed)
Ms Leitch says she wants to see Dr. Katy Fitch for her diabetes eye care. Recommend a referral to get that scheduled.

## 2018-01-19 ENCOUNTER — Other Ambulatory Visit: Payer: Self-pay | Admitting: Internal Medicine

## 2018-01-19 DIAGNOSIS — E119 Type 2 diabetes mellitus without complications: Secondary | ICD-10-CM

## 2018-01-19 NOTE — Telephone Encounter (Signed)
Thanks so much, Lauren. Will discuss with patient and place referral at our appointment this week!

## 2018-01-23 ENCOUNTER — Ambulatory Visit (INDEPENDENT_AMBULATORY_CARE_PROVIDER_SITE_OTHER): Payer: PPO | Admitting: Internal Medicine

## 2018-01-23 ENCOUNTER — Encounter: Payer: Self-pay | Admitting: Internal Medicine

## 2018-01-23 DIAGNOSIS — K5900 Constipation, unspecified: Secondary | ICD-10-CM | POA: Diagnosis not present

## 2018-01-23 DIAGNOSIS — M8580 Other specified disorders of bone density and structure, unspecified site: Secondary | ICD-10-CM | POA: Insufficient documentation

## 2018-01-23 DIAGNOSIS — Z803 Family history of malignant neoplasm of breast: Secondary | ICD-10-CM | POA: Diagnosis not present

## 2018-01-23 DIAGNOSIS — M8589 Other specified disorders of bone density and structure, multiple sites: Secondary | ICD-10-CM | POA: Diagnosis not present

## 2018-01-23 DIAGNOSIS — E119 Type 2 diabetes mellitus without complications: Secondary | ICD-10-CM | POA: Diagnosis not present

## 2018-01-23 DIAGNOSIS — N950 Postmenopausal bleeding: Secondary | ICD-10-CM

## 2018-01-23 LAB — POCT GLYCOSYLATED HEMOGLOBIN (HGB A1C): Hemoglobin A1C: 8.4 % — AB (ref 4.0–5.6)

## 2018-01-23 LAB — GLUCOSE, CAPILLARY: GLUCOSE-CAPILLARY: 220 mg/dL — AB (ref 65–99)

## 2018-01-23 MED ORDER — METFORMIN HCL 1000 MG PO TABS: 1000.0000 mg | ORAL_TABLET | Freq: Two times a day (BID) | ORAL | 2 refills | Status: DC

## 2018-01-23 MED ORDER — GLIPIZIDE ER 5 MG PO TB24: 5.0000 mg | ORAL_TABLET | Freq: Every day | ORAL | 0 refills | Status: DC

## 2018-01-23 NOTE — Progress Notes (Signed)
   CC: follow-up of ED visit, vaginal bleeding  HPI:  [name] is a 67 y.o. F with medical history as outlined below who presents today for ED follow-up.   Patient seen in outside ED 6/6 with 2-day history of vaginal bleeding and several weeks of lower abdominal discomfort and low back pain. Per note, pelvic exam without abnormality or vaginal bleeding but patient did admit to significant pain during this exam (unusual as historically PAPs and pelvic exams have been painless). UA performed with +Leukocyte esterase, 10-20 WBC and 0-2 RBC and she was discharged with Rx for Bactrim for UTI and instructions to schedule an appointment with GYN. Urine culture ultimately resulted with 30,000 GP flora. Her symptoms persist and she continues to have daily vaginal bleeding. In addition, she complains of significant fatigue over the past month and tenesmus for 6 months. Last experienced vaginal bleeding >20 years ago prior to menopause.   Family history significant for breast cancer (sister diagnosed in 89's) and mother (56's). Brother deceased from malignancy but unsure of primary.   Past Medical History:  Diagnosis Date  . Complication of anesthesia   . Diabetes mellitus without complication ([place])   . Family history of adverse reaction to anesthesia    mom is slow to wake up  . Hypertension   . Irritable bowel   . Obesity   . PONV (postoperative nausea and vomiting)    Review of Systems:   General: Admits to fatigue. Denies fevers, chills, weight loss HEENT: Denies acute changes in vision, sore throat Cardiac: Denies CP, SOB Pulmonary: Denies cough Abd: Admits to lower abdominal pain and fullness. Admits to constipation and tenesmus. Denied hematochezia or melena. No changes in bowel caliber. Extremities: Denies weakness or swelling  Physical Exam: General: Alert, in no acute distress. Pleasant and conversant but does appear fatigued.  HEENT: No icterus, injection or ptosis. No  hoarseness or dysarthria. Moist mucous membranes.  Cardiac: RRR, no MGR appreciated Pulmonary: CTA BL with normal WOB on RA. Able to speak in complete sentences Abd: Obese abdomen. Soft, not distended. Mild suprapubic discomfort on palpation. No palpable masses but exam limited by body habitus. No CVA tenderness.  Extremities: Warm, perfused. No significant pedal edema.   Assessment & Plan:   See Encounters Tab for problem based charting.  Patient discussed with Dr. Rebeca Alert

## 2018-01-23 NOTE — Assessment & Plan Note (Signed)
Assessment:  Pt with pink or bright red bleeding intermittently throughout the day for 10-days. Seen in ED with UA with +leukocyte esterase and 10-20 WBCs (notably with only 0-2 RBCs) and was treated with Bactrim for UTI without improvement. Urine culture ultimately resulted 30,000 CFU normal flora.  She denies prior history of similar episodes and notes last period >20 yrs ago. She also notes lower abdominal/pelvic discomfort and generalized fatigue for 86-month in addition to 80-month history of constipation (although description of symptoms are consistent with tenesmus). She's had historically normal PAPs (most recently 09/2016) and mammograms (most recently 09/2017). Last colonoscopy 05/2016 with 2 benign polyps. Family history with mother and sister with breast cancer and an unspecified malignancy in brother but otherwise negative.   Plan: Post-menopausal bleeding certainly is concerning, especially in the setting of pelvic discomfort, painful pelvic exam, fatigue and tenesmus. Differential diagnosis includes malignancy and I'm referring her urgently to GYN. I've also ordered an urgent pelvic US and have asked front-desk staff for their assistance in expediting this. Appreciate staffs assistance.

## 2018-01-23 NOTE — Assessment & Plan Note (Signed)
Assessment & Plan: Reviewed results of recent DEXA scan which showed osteopenia (T -1.5; FRAX <10%). No history of pathologic fx.  Pt taking Calcium and Vitamin D supplements daily.

## 2018-01-23 NOTE — Patient Instructions (Signed)
It was nice seeing you today. Thank you for choosing [facility] for your Primary Care.   Today we talked about:  1) Vaginal Bleeding: I'm sorry you are continuing to have vaginal bleeding. This is very concerning and I am referring you to gynecology. Please schedule this as soon as possible.  2) I've also ordered an ultrasound to take a look at your uterus and ovaries.  3) I will call you with the results of your A1c and discuss further plans.  Please contact the clinic if you have any problems, or need to be seen sooner.

## 2018-01-23 NOTE — Assessment & Plan Note (Addendum)
Assessment: Pt continues to complain of ongoing "constipation" despite what seems to be good effort at OTC treatment. She reports she is still having several BMs per day, but feels like she has not had a complete bowel movement (despite no BM on repeat attempts). Last colonoscopy 10/201 with 2 benign polyps.   Plan: Given her ongoing constipation and new post-menopausal bleeding and pelvic discomfort, I'm concerned there may be another process going on. Work-up for gyn malignancy as noted.

## 2018-01-23 NOTE — Assessment & Plan Note (Addendum)
Assessment:  A1c 8.4% today, up from 6.9% 07/2017. Patient reports adherance to Glipizide 2.5mg  and Metformin 1000mg  daily. No significant changes in diet but has noticed her blood sugars have been running higher than usual for a few months.      Plan: Increase Glipizide back to 5mg  daily and increase Metformin to 2000mg  daily. Discussed with patient. New Rx sent to pharmacy. Patient to continue monitoring CBGs; will follow-up A1c in 16-months

## 2018-01-25 DIAGNOSIS — R1031 Right lower quadrant pain: Secondary | ICD-10-CM | POA: Diagnosis not present

## 2018-01-25 DIAGNOSIS — N950 Postmenopausal bleeding: Secondary | ICD-10-CM | POA: Diagnosis not present

## 2018-01-25 DIAGNOSIS — R1032 Left lower quadrant pain: Secondary | ICD-10-CM | POA: Diagnosis not present

## 2018-01-25 DIAGNOSIS — Z1151 Encounter for screening for human papillomavirus (HPV): Secondary | ICD-10-CM | POA: Diagnosis not present

## 2018-01-25 DIAGNOSIS — N858 Other specified noninflammatory disorders of uterus: Secondary | ICD-10-CM | POA: Diagnosis not present

## 2018-01-29 ENCOUNTER — Ambulatory Visit (HOSPITAL_COMMUNITY): Admission: RE | Admit: 2018-01-29 | Payer: PPO | Source: Ambulatory Visit

## 2018-02-02 NOTE — Progress Notes (Signed)
Internal Medicine Clinic Attending  Case discussed with Dr. Molt  at the time of the visit.  We reviewed the resident's history and exam and pertinent patient test results.  I agree with the assessment, diagnosis, and plan of care documented in the resident's note.  Alexander N Raines, MD   

## 2018-02-11 MED FILL — ATORVASTATIN 80 MG TABLET: 80 | 90 days supply | Qty: 90 | Fill #2

## 2018-02-18 DIAGNOSIS — N950 Postmenopausal bleeding: Secondary | ICD-10-CM | POA: Diagnosis not present

## 2018-02-25 DIAGNOSIS — K760 Fatty (change of) liver, not elsewhere classified: Secondary | ICD-10-CM | POA: Diagnosis not present

## 2018-02-25 DIAGNOSIS — R109 Unspecified abdominal pain: Secondary | ICD-10-CM | POA: Diagnosis not present

## 2018-02-25 DIAGNOSIS — N950 Postmenopausal bleeding: Secondary | ICD-10-CM | POA: Diagnosis not present

## 2018-02-25 DIAGNOSIS — I700 Atherosclerosis of aorta: Secondary | ICD-10-CM | POA: Diagnosis not present

## 2018-03-08 DIAGNOSIS — N950 Postmenopausal bleeding: Secondary | ICD-10-CM | POA: Diagnosis not present

## 2018-03-08 DIAGNOSIS — N859 Noninflammatory disorder of uterus, unspecified: Secondary | ICD-10-CM | POA: Diagnosis not present

## 2018-03-14 ENCOUNTER — Encounter: Payer: Self-pay | Admitting: *Deleted

## 2018-03-21 DIAGNOSIS — Z4889 Encounter for other specified surgical aftercare: Secondary | ICD-10-CM | POA: Diagnosis not present

## 2018-03-21 DIAGNOSIS — N823 Fistula of vagina to large intestine: Secondary | ICD-10-CM | POA: Diagnosis not present

## 2018-03-21 DIAGNOSIS — M109 Gout, unspecified: Secondary | ICD-10-CM | POA: Diagnosis not present

## 2018-03-21 DIAGNOSIS — M25572 Pain in left ankle and joints of left foot: Secondary | ICD-10-CM | POA: Diagnosis not present

## 2018-03-21 DIAGNOSIS — N952 Postmenopausal atrophic vaginitis: Secondary | ICD-10-CM | POA: Diagnosis not present

## 2018-03-26 ENCOUNTER — Encounter: Payer: Self-pay | Admitting: Internal Medicine

## 2018-03-28 DIAGNOSIS — K5792 Diverticulitis of intestine, part unspecified, without perforation or abscess without bleeding: Secondary | ICD-10-CM | POA: Insufficient documentation

## 2018-03-28 DIAGNOSIS — Z86010 Personal history of colonic polyps: Secondary | ICD-10-CM | POA: Insufficient documentation

## 2018-04-29 NOTE — Addendum Note (Signed)
Addended by: Hulan Fray on: 04/29/2018 07:38 PM   Modules accepted: Orders

## 2018-05-01 ENCOUNTER — Other Ambulatory Visit: Payer: Self-pay | Admitting: Internal Medicine

## 2018-05-01 DIAGNOSIS — E119 Type 2 diabetes mellitus without complications: Secondary | ICD-10-CM

## 2018-05-06 ENCOUNTER — Telehealth: Payer: Self-pay | Admitting: Internal Medicine

## 2018-05-06 NOTE — Telephone Encounter (Signed)
Thanks for getting her an appointment!

## 2018-05-06 NOTE — Telephone Encounter (Signed)
Pt having sharp pain in both arms.  Transferred call to Triage nurse Lauren.

## 2018-05-06 NOTE — Telephone Encounter (Signed)
Patient called in states 2 weeks ago had right leg numbness that lasted 2-3 days. 3 weeks ago had left arm pain from shoulder to elbow. One week later right arm became painful from shoulder to elbow. Pain in both arms continues. Rates 3-4/10 at present but increases with arm movement. Patient offered appt tomorrow AM in [facility]. 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 Appt given in [facility] at 1:15 on 05/08/2018. Hubbard Hartshorn, RN, BSN

## 2018-05-08 ENCOUNTER — Encounter: Payer: Self-pay | Admitting: Internal Medicine

## 2018-05-08 ENCOUNTER — Ambulatory Visit (INDEPENDENT_AMBULATORY_CARE_PROVIDER_SITE_OTHER): Payer: PPO | Admitting: Internal Medicine

## 2018-05-08 ENCOUNTER — Other Ambulatory Visit: Payer: Self-pay

## 2018-05-08 VITALS — BP 126/80 | HR 87 | Temp 97.8°F | Ht 63.0 in | Wt 211.6 lb

## 2018-05-08 DIAGNOSIS — Z79899 Other long term (current) drug therapy: Secondary | ICD-10-CM | POA: Diagnosis not present

## 2018-05-08 DIAGNOSIS — R748 Abnormal levels of other serum enzymes: Secondary | ICD-10-CM | POA: Diagnosis not present

## 2018-05-08 DIAGNOSIS — I10 Essential (primary) hypertension: Secondary | ICD-10-CM

## 2018-05-08 DIAGNOSIS — M25512 Pain in left shoulder: Secondary | ICD-10-CM | POA: Diagnosis not present

## 2018-05-08 DIAGNOSIS — N939 Abnormal uterine and vaginal bleeding, unspecified: Secondary | ICD-10-CM | POA: Diagnosis not present

## 2018-05-08 DIAGNOSIS — M79622 Pain in left upper arm: Secondary | ICD-10-CM | POA: Diagnosis not present

## 2018-05-08 DIAGNOSIS — Z23 Encounter for immunization: Secondary | ICD-10-CM | POA: Diagnosis not present

## 2018-05-08 DIAGNOSIS — M2550 Pain in unspecified joint: Secondary | ICD-10-CM

## 2018-05-08 DIAGNOSIS — N823 Fistula of vagina to large intestine: Secondary | ICD-10-CM | POA: Diagnosis not present

## 2018-05-08 MED ORDER — NAPROXEN 500 MG PO TABS: 500.0000 mg | ORAL_TABLET | Freq: Two times a day (BID) | ORAL | 2 refills | Status: DC

## 2018-05-08 NOTE — Assessment & Plan Note (Addendum)
She is having very nonspecific symptoms which include transient and migratory proximal muscle pain and weakness. Differential can be polymyalgia rheumatica, statin myopathy, hypo-or hyperthyroidism, hep B or C, inflammatory bowel disease.  Patient clinical picture does not completely fit with any of those.  -We will check labs for ESR, CRP, TSH, CBC, CMP, CK, hep B and C. -She was given a prescription of naproxen 500 mg to be taken twice daily for symptom relief. -If labs are normal then she might be having some musculoskeletal pain.  Addendum.  She was found to have mildly elevated ESR at 42 and elevated alkaline phosphatase at 203 without any other liver abnormalities.  Rest of her labs are within normal limit. And polymyalgia rheumatica you can have a ESR above 40 and elevated alkaline phosphatase. Her presentation was not very usual for polymyalgia rheumatica, but that can be a possibility. We will check fractionated alkaline phosphatase and GGT. If her symptoms fail to resolve within next few days or she develops worsening of her symptoms, might get benefit with a trial of low-dose steroid.

## 2018-05-08 NOTE — Assessment & Plan Note (Signed)
BP Readings from Last 3 Encounters:  05/08/18 126/80  07/17/17 114/66  01/30/17 135/78   She was normotensive today.  Continue current management with hydrochlorothiazide.

## 2018-05-08 NOTE — Patient Instructions (Addendum)
Thank you for visiting clinic today. We are checking some labs to see if we can find any reason for this type of pain. I will call you with results. I am giving you a prescription strength Aleve/naproxen, you can take it twice daily if needed for pain. Please also make an appointment with your PCP.

## 2018-05-08 NOTE — Progress Notes (Signed)
CC: Shoulder and upper arm pain for 1 week.  HPI:  [name] is a 67 y.o. with past medical history as listed below came to the clinic with complaint of migratory shoulder and upper arm pain.  According to patient 2 weeks ago she developed pain in her right hip and thigh for 2 days which resolved on its own, during that episode she was feeling weak in her right leg and was dragging her foot while walking. Approximately a week ago she developed pain in her right shoulder and right upper arm with weakness of her right upper extremity so that she cannot put her upper extremity above her head.  Pain and weakness resolved after 3 to 4 days, now she developed similar symptoms on her left shoulder and left upper arm.  She described pain as dull and nagging around her biceps area and on top of her shoulder.  Keeping her arm in one position help with the pain, she was taking one Aleve with some relief.  Certain movements with her left shoulder and make it worse.  She is struggling with finding a comfortable position to sleep at night.  She was also complaining of weakness in her left arm now. She occasionally feels some morning stiffness requiring some massaging of her upper arm.  Her pain get worse towards the end of the day. We discussed her symptoms with a telemetry medical visit and they prescribed her low-dose prednisone, she has not started taking that.  She denies any rash. Denies any jaw claudications, blurry vision, eye pain or headache. Denies any blurry vision or diplopia with continuous reading or watching TV. Denies any difficulty with standing from sitting position. Denies any ongoing lower extremity weakness, her lower extremity symptoms has been resolved. She denies any GI symptoms. She denies any urinary symptoms.  She continued to get intermittent vaginal bleeding, it has been investigated by an office and GYN and biopsy was normal, she had a CT abdomen and pelvis done to  evaluate her ovaries which shows colovaginal fistula, no scheduled for colonoscopy on May 23, 2018.  She denies any fecal incontinence or seen feces in her vagina.  Past Medical History:  Diagnosis Date  . Complication of anesthesia   . Diabetes mellitus without complication ([place])   . Family history of adverse reaction to anesthesia    mom is slow to wake up  . Hypertension   . Irritable bowel   . Obesity   . PONV (postoperative nausea and vomiting)    Review of Systems: Negative except mentioned in HPI.  Physical Exam:  Vitals:   05/08/18 1330  BP: 126/80  Pulse: 87  Temp: 97.8 F (36.6 C)  TempSrc: Oral  SpO2: 96%  Weight: 211 lb 9.6 oz (96 kg)  Height: 5\' 3"  (1.6 m)    General: Vital signs reviewed.  Patient is well-developed and well-nourished, in no acute distress and cooperative with exam.  Head: Normocephalic and atraumatic. Eyes: EOMI, conjunctivae normal, no scleral icterus.  Neck: Supple, trachea midline, normal ROM, no JVD, masses, thyromegaly, or carotid bruit present.  Cardiovascular: RRR, S1 normal, S2 normal, no murmurs, gallops, or rubs. Pulmonary/Chest: Clear to auscultation bilaterally, no wheezes, rales, or rhonchi. Abdominal: Soft, non-tender, non-distended, BS +, Musculoskeletal: No obvious edema, erythema or tenderness around shoulder or upper extremities, restricted range of motion around left shoulder due to pain.  Patient unable to raise her left hand above her chest level, able to do so with help of  other hand.  Mildly decreased strength at left extensors, and left hand grip(most likely due to pain) no lower extremity or proximal muscle weakness. Extremities: No lower extremity edema bilaterally,  pulses symmetric and intact bilaterally. No cyanosis or clubbing. Skin: Warm, dry and intact. No rashes or erythema. Psychiatric: Normal mood and affect. speech and behavior is normal. Cognition and memory are normal.  Assessment & Plan:   See  Encounters Tab for problem based charting.  Patient discussed with Dr. Daryll Drown.

## 2018-05-09 NOTE — Addendum Note (Signed)
Addended by: Lorella Nimrod on: 05/09/2018 10:42 AM   Modules accepted: Orders

## 2018-05-10 LAB — CBC WITH DIFFERENTIAL/PLATELET
BASOS ABS: 0.1 10*3/uL (ref 0.0–0.2)
Basos: 1 %
EOS (ABSOLUTE): 0.2 10*3/uL (ref 0.0–0.4)
Eos: 3 %
HEMOGLOBIN: 14 g/dL (ref 11.1–15.9)
Hematocrit: 41.3 % (ref 34.0–46.6)
Immature Grans (Abs): 0 10*3/uL (ref 0.0–0.1)
Immature Granulocytes: 0 %
LYMPHS ABS: 2.9 10*3/uL (ref 0.7–3.1)
Lymphs: 31 %
MCH: 29.9 pg (ref 26.6–33.0)
MCHC: 33.9 g/dL (ref 31.5–35.7)
MCV: 88 fL (ref 79–97)
MONOCYTES: 5 %
Monocytes Absolute: 0.5 10*3/uL (ref 0.1–0.9)
Neutrophils Absolute: 5.7 10*3/uL (ref 1.4–7.0)
Neutrophils: 60 %
Platelets: 448 10*3/uL (ref 150–450)
RBC: 4.68 x10E6/uL (ref 3.77–5.28)
RDW: 12.3 % (ref 12.3–15.4)
WBC: 9.4 10*3/uL (ref 3.4–10.8)

## 2018-05-10 LAB — CMP14 + ANION GAP
A/G RATIO: 1.3 (ref 1.2–2.2)
ALBUMIN: 3.9 g/dL (ref 3.6–4.8)
ALT: 24 IU/L (ref 0–32)
AST: 17 IU/L (ref 0–40)
Alkaline Phosphatase: 203 IU/L — ABNORMAL HIGH (ref 39–117)
Anion Gap: 16 mmol/L (ref 10.0–18.0)
BILIRUBIN TOTAL: 0.3 mg/dL (ref 0.0–1.2)
BUN/Creatinine Ratio: 22 (ref 12–28)
BUN: 15 mg/dL (ref 8–27)
CHLORIDE: 100 mmol/L (ref 96–106)
CO2: 24 mmol/L (ref 20–29)
Calcium: 9.4 mg/dL (ref 8.7–10.3)
Creatinine, Ser: 0.67 mg/dL (ref 0.57–1.00)
GFR calc non Af Amer: 92 mL/min/{1.73_m2} (ref >59)
GFR, EST AFRICAN AMERICAN: 106 mL/min/{1.73_m2} (ref >59)
GLOBULIN, TOTAL: 3.1 g/dL (ref 1.5–4.5)
Glucose: 186 mg/dL — ABNORMAL HIGH (ref 65–99)
POTASSIUM: 4.7 mmol/L (ref 3.5–5.2)
Sodium: 140 mmol/L (ref 134–144)
TOTAL PROTEIN: 7 g/dL (ref 6.0–8.5)

## 2018-05-10 LAB — GAMMA GT: GGT: 36 IU/L (ref 0–60)

## 2018-05-10 LAB — CK: Total CK: 49 U/L (ref 24–173)

## 2018-05-10 LAB — HEPATITIS B SURFACE ANTIBODY,QUALITATIVE: Hep B Surface Ab, Qual: NONREACTIVE

## 2018-05-10 LAB — C-REACTIVE PROTEIN: CRP: 4 mg/L (ref 0–10)

## 2018-05-10 LAB — HEPATITIS B SURFACE ANTIGEN: HEP B S AG: NEGATIVE

## 2018-05-10 LAB — HEPATITIS B E ANTIBODY: HEP B E AB: NEGATIVE

## 2018-05-10 LAB — HEPATITIS B CORE ANTIBODY, TOTAL: Hep B Core Total Ab: NEGATIVE

## 2018-05-10 LAB — SEDIMENTATION RATE: Sed Rate: 42 mm/hr — ABNORMAL HIGH (ref 0–40)

## 2018-05-10 LAB — SPECIMEN STATUS REPORT

## 2018-05-10 LAB — HEPATITIS C ANTIBODY

## 2018-05-10 LAB — TSH: TSH: 3.29 u[IU]/mL (ref 0.450–4.500)

## 2018-05-11 LAB — ALKALINE PHOSPHATASE, ISOENZYMES
ALK PHOS: 194 IU/L — AB (ref 39–117)
BONE FRACTION: 7 % — AB (ref 14–68)
INTESTINAL FRAC.: 0 % (ref 0–18)
LIVER FRACTION: 93 % — AB (ref 18–85)

## 2018-05-11 LAB — SPECIMEN STATUS REPORT

## 2018-05-11 NOTE — Progress Notes (Signed)
Internal Medicine Clinic Attending  Case discussed with Dr. Amin at the time of the visit.  We reviewed the resident's history and exam and pertinent patient test results.  I agree with the assessment, diagnosis, and plan of care documented in the resident's note.    

## 2018-05-15 MED FILL — ATORVASTATIN 80 MG TABLET: 80 | 90 days supply | Qty: 90 | Fill #3

## 2018-05-19 ENCOUNTER — Encounter: Payer: Self-pay | Admitting: *Deleted

## 2018-05-28 DIAGNOSIS — K6389 Other specified diseases of intestine: Secondary | ICD-10-CM | POA: Diagnosis not present

## 2018-05-28 DIAGNOSIS — Z1211 Encounter for screening for malignant neoplasm of colon: Secondary | ICD-10-CM | POA: Diagnosis not present

## 2018-05-28 DIAGNOSIS — K5730 Diverticulosis of large intestine without perforation or abscess without bleeding: Secondary | ICD-10-CM | POA: Diagnosis not present

## 2018-05-28 DIAGNOSIS — Z86010 Personal history of colonic polyps: Secondary | ICD-10-CM | POA: Diagnosis not present

## 2018-05-28 DIAGNOSIS — D125 Benign neoplasm of sigmoid colon: Secondary | ICD-10-CM | POA: Diagnosis not present

## 2018-05-28 DIAGNOSIS — K635 Polyp of colon: Secondary | ICD-10-CM | POA: Diagnosis not present

## 2018-05-28 DIAGNOSIS — K641 Second degree hemorrhoids: Secondary | ICD-10-CM | POA: Diagnosis not present

## 2018-06-04 ENCOUNTER — Encounter: Payer: PPO | Admitting: Internal Medicine

## 2018-06-10 ENCOUNTER — Encounter: Payer: Self-pay | Admitting: Family Medicine

## 2018-06-11 ENCOUNTER — Encounter: Payer: Self-pay | Admitting: Family Medicine

## 2018-06-11 ENCOUNTER — Ambulatory Visit (INDEPENDENT_AMBULATORY_CARE_PROVIDER_SITE_OTHER): Payer: PPO | Admitting: Family Medicine

## 2018-06-11 ENCOUNTER — Ambulatory Visit (HOSPITAL_COMMUNITY)
Admission: RE | Admit: 2018-06-11 | Discharge: 2018-06-11 | Disposition: A | Discharge status: Home / Self Care | Payer: PPO | Source: Ambulatory Visit | Attending: Family Medicine | Admitting: Family Medicine

## 2018-06-11 VITALS — BP 129/67 | HR 79 | Temp 97.5°F | Resp 17 | Ht 63.0 in | Wt 213.4 lb

## 2018-06-11 DIAGNOSIS — M25512 Pain in left shoulder: Secondary | ICD-10-CM

## 2018-06-11 DIAGNOSIS — M19012 Primary osteoarthritis, left shoulder: Secondary | ICD-10-CM | POA: Diagnosis not present

## 2018-06-11 DIAGNOSIS — M79602 Pain in left arm: Secondary | ICD-10-CM

## 2018-06-11 DIAGNOSIS — Z7689 Persons encountering health services in other specified circumstances: Secondary | ICD-10-CM

## 2018-06-11 MED ORDER — METHOCARBAMOL 500 MG PO TABS: 500.0000 mg | ORAL_TABLET | Freq: Two times a day (BID) | ORAL | 0 refills | Status: DC | PRN

## 2018-06-11 MED ORDER — KETOROLAC TROMETHAMINE 30 MG/ML IJ SOLN
30.0000 mg | Freq: Once | INTRAMUSCULAR | Status: AC
  Administered 2018-06-11: 30 mg via INTRAMUSCULAR

## 2018-06-11 NOTE — Progress Notes (Signed)
Laurie Michael, is a 67 y.o. female  [id number]  [id number]:[id number]  DOB - 1951/03/18  CC:  Chief Complaint  Patient presents with  . Establish Care  . Shoulder Pain       HPI: Laurie Michael is a 67 y.o. female is here today to establish care.   Laurie Michael has Essential hypertension; Type 2 diabetes mellitus ([place]); Cataracts, bilateral; Hyperlipidemia; Hyperlipidemia associated with type 2 diabetes mellitus ([place]); Dental calculus; Fatigue; Hypothyroidism; Carpal tunnel syndrome, bilateral; Obesity; Estrogen deficiency; Osteopenia; Post-menopausal bleeding; and Polyarthralgia on their problem list.    Today's visit:  Laurie Michael presents today with a complaint of 2 months of gradually worsening left shoulder pain which is now radiating down her left arm. Complains of limited ROM. Pain is so intense at time, it is impacting quality of sleep. Pain is a throbbing. Denies numbness or tingling left lower arm and hand. She initially saw a provider face to fact with this issue and was placed on Naproxen. She subsequently, completed an MD live encounter and receive prednisone which improved symptoms. Within days of completing prednisone, symptoms returned and pain is back to original intensity. No prior diagnosis of arthritis. No imaging performed on shoulder or arm. Denies any associated neck pain.   Patient denies new headaches, chest pain, abdominal pain, nausea, new weakness , numbness or tingling, SOB, edema, or worrisome cough.     Current medications: Current Outpatient Medications:  .  Ascorbic Acid (VITAMIN C) 1000 MG tablet, Take 1,000 mg by mouth daily., Disp: , Rfl:  .  atorvastatin (LIPITOR) 80 MG tablet, Take 1 tablet (80 mg total) by mouth daily., Disp: 90 tablet, Rfl: 4 .  calcium-vitamin D (OSCAL WITH D) 500-200 MG-UNIT tablet, Take 2 tablets by mouth daily., Disp: , Rfl:  .  docusate sodium (COLACE) 100 MG capsule, Take 1 capsule (100 mg total) by mouth 2 (two) times daily as  needed for mild constipation or moderate constipation., Disp: 60 capsule, Rfl: 11 .  glipiZIDE (GLUCOTROL XL) 5 MG 24 hr tablet, TAKE 1 TABLET BY MOUTH ONCE DAILY, Disp: 90 tablet, Rfl: 0 .  glucose blood (ONE TOUCH ULTRA TEST) test strip, Use to check blood sugar once daily. Diagnosis code E11.9, Disp: 100 each, Rfl: 12 .  glucose blood test strip, Use to check blood sugar once daily. Diagnosis code E11.9, Disp: 100 each, Rfl: 3 .  hydrochlorothiazide (HYDRODIURIL) 25 MG tablet, Take 1 tablet (25 mg total) by mouth daily., Disp: 30 tablet, Rfl: 11 .  levothyroxine (SYNTHROID) 50 MCG tablet, Take 1 tablet (50 mcg total) by mouth daily., Disp: 30 tablet, Rfl: 11 .  magnesium 30 MG tablet, Take 30 mg by mouth 2 (two) times daily., Disp: , Rfl:  .  metFORMIN (GLUCOPHAGE) 1000 MG tablet, Take 1 tablet (1,000 mg total) by mouth 2 (two) times daily with a meal., Disp: 180 tablet, Rfl: 2 .  naproxen (NAPROSYN) 500 MG tablet, Take 1 tablet (500 mg total) by mouth 2 (two) times daily with a meal., Disp: 60 tablet, Rfl: 2   Pertinent family medical history: family history includes Breast cancer in her mother and sister; Cancer in her brother; Diabetes in her mother; Heart disease in her father.   No Known Allergies  Social History   Socioeconomic History  . Marital status: Married    Spouse name: Not on file  . Number of children: Not on file  . Years of education: Not on file  . Highest education level: Not on file  Occupational History  . Not on file  Social Needs  . Financial resource strain: Not on file  . Food insecurity:    Worry: Not on file    Inability: Not on file  . Transportation needs:    Medical: Not on file    Non-medical: Not on file  Tobacco Use  . Smoking status: Former Smoker    Packs/day: 2.00    Years: 27.00    Pack years: 54.00    Types: Cigarettes    Last attempt to quit: 01/10/2013    Years since quitting: 5.4  . Smokeless tobacco: Never Used  Substance and  Sexual Activity  . Alcohol use: No    Alcohol/week: 0.0 standard drinks  . Drug use: No  . Sexual activity: Yes    Birth control/protection: None  Lifestyle  . Physical activity:    Days per week: Not on file    Minutes per session: Not on file  . Stress: Not on file  Relationships  . Social connections:    Talks on phone: Not on file    Gets together: Not on file    Attends religious service: Not on file    Active member of club or organization: Not on file    Attends meetings of clubs or organizations: Not on file    Relationship status: Not on file  . Intimate partner violence:    Fear of current or ex partner: Not on file    Emotionally abused: Not on file    Physically abused: Not on file    Forced sexual activity: Not on file  Other Topics Concern  . Not on file  Social History Narrative  . Not on file    Review of Systems: Constitutional: Negative for fever, chills, diaphoresis, activity change, appetite change and fatigue. Musculoskeletal: positive for arthralgia left shoulder and arm. Neurological: Negative for dizziness, tremors, seizures, syncope, facial asymmetry, speech difficulty, weakness, light-headedness, numbness and headaches.  Hematological: Negative for adenopathy. Does not bruise/bleed easily. Psychiatric/Behavioral: Negative for hallucinations, behavioral problems, confusion, dysphoric mood, decreased concentration and agitation.  Objective:   Vitals:   06/11/18 1406  BP: 129/67  Pulse: 79  Resp: 17  Temp: (!) 97.5 F (36.4 C)  SpO2: 96%    BP Readings from Last 3 Encounters:  05/08/18 126/80  07/17/17 114/66  01/30/17 135/78    Filed Weights   06/11/18 1406  Weight: 213 lb 6.4 oz (96.8 kg)     Physical Exam  Constitutional: She is oriented to person, place, and time. She appears well-developed and well-nourished.  Neck: Normal range of motion. Neck supple. No tracheal deviation present. No thyromegaly present.  Cardiovascular:  Normal rate, regular rhythm, normal heart sounds and intact distal pulses.  Pulmonary/Chest: Effort normal and breath sounds normal.  Musculoskeletal:       Right shoulder: She exhibits decreased range of motion, tenderness, pain and decreased strength. She exhibits no bony tenderness, no swelling and no crepitus.  Lymphadenopathy:    She has no cervical adenopathy.  Neurological: She is alert and oriented to person, place, and time. No sensory deficit.  Skin: Skin is warm and dry.  Lab Results (prior encounters)  Lab Results  Component Value Date   WBC 9.4 05/08/2018   HGB 14.0 05/08/2018   HCT 41.3 05/08/2018   MCV 88 05/08/2018   PLT 448 05/08/2018   Lab Results  Component Value Date   CREATININE 0.67 05/08/2018   BUN 15 05/08/2018   NA 140  05/08/2018   K 4.7 05/08/2018   CL 100 05/08/2018   CO2 24 05/08/2018    Lab Results  Component Value Date   HGBA1C 8.4 (A) 01/23/2018       Component Value Date/Time   CHOL 241 (H) 02/29/2016 1639   TRIG 369 (H) 02/29/2016 1639   HDL 39 (L) 02/29/2016 1639   CHOLHDL 6.2 (H) 02/29/2016 1639   LDLCALC 128 (H) 02/29/2016 1639        Assessment and plan:  1. Encounter to establish care  2. Acute pain of left shoulder - DG Shoulder Left; Future - DG Humerus Left; Future - ketorolac (TORADOL) 30 MG/ML injection 30 mg - Ambulatory referral to Orthopedic Surgery  3. Left arm pain - ketorolac (TORADOL) 30 MG/ML injection 30 mg - Ambulatory referral to Orthopedic Surgery  Imaging confirmed degenerative changes in Laurie Michael joint of left shoulder. Patient given Robaxin to reduce pain at bedtime. She will resume Naproxen tomorrow since as she was given a Toradol injection today. Referral placed for further evaluation by orthopedic specialty.   A total of 30 minutes spent, greater than 50 % of this time was spent counseling and coordination of care.    Return in about 2 weeks (around 06/25/2018) for chronic conditions and medication  management .   The patient was given clear instructions to go to ER or return to medical center if symptoms don't improve, worsen or new problems develop. The patient verbalized understanding. The patient was advised  to call and obtain lab results if they haven't heard anything from out office within 7-10 business days.  Molli Barrows, FNP [facility] [address], [place] [address] [phone]: [phone]    This note has been created with Dragon speech recognition software and Engineer, materials. Any transcriptional errors are unintentional.

## 2018-06-11 NOTE — Patient Instructions (Signed)
Go to [facility] to have X-ray completed. I will notify you either this evening or tomorrow with results of imaging. I am placing a referral for you to see an orthopedic specialist-their office will contact you to schedule an appointment. I have ordered Robaxin 500 mg twice daily to manage pain. You may take an extra strength tylenol to help with pain. You have received a Torodal injection today. Avoid taking any Naproxen or Ibuprofen today.     Thank you for choosing [facility] for your medical home!    Laurie Michael was seen by Laurie Barrows, FNP today.   Laurie Michael's primary care provider  is Scot Jun, FNP.   For the best care possible,  you should try to see Laurie Barrows, FNP whenever you come to clinic.   We look forward to seeing you again soon!  If you have any questions about your visit today,  please call us at   Or feel free to reach your provider via [address].      Shoulder Pain Many things can cause shoulder pain, including:  An injury.  Moving the arm in the same way again and again (overuse).  Joint pain (arthritis).  Follow these instructions at home: Take these actions to help with your pain:  Squeeze a soft ball or a foam pad as much as you can. This helps to prevent swelling. It also makes the arm stronger.  Take over-the-counter and prescription medicines only as told by your doctor.  If told, put ice on the area: ? Put ice in a plastic bag. ? Place a towel between your skin and the bag. ? Leave the ice on for 20 minutes, 2-3 times per day. Stop putting on ice if it does not help with the pain.  If you were given a shoulder sling or immobilizer: ? Wear it as told. ? Remove it to shower or bathe. ? Move your arm as little as possible. ? Keep your hand moving. This helps prevent swelling.  Contact a doctor if:  Your pain gets worse.  Medicine does not help your  pain.  You have new pain in your arm, hand, or fingers. Get help right away if:  Your arm, hand, or fingers: ? Tingle. ? Are numb. ? Are swollen. ? Are painful. ? Turn white or blue. This information is not intended to replace advice given to you by your health care provider. Make sure you discuss any questions you have with your health care provider. Document Released: 01/17/2008 Document Revised: 03/26/2016 Document Reviewed: 11/23/2014 Elsevier Interactive Patient Education  2018 [employer].   Ketorolac injection What is this medicine? KETOROLAC (kee toe ROLE ak) is a non-steroidal anti-inflammatory drug (NSAID). It is used to treat moderate to severe pain for up to 5 days. It is commonly used after surgery. This medicine should not be used for more than 5 days. This medicine may be used for other purposes; ask your health care provider or pharmacist if you have questions. COMMON BRAND NAME(S): Toradol What should I tell my health care provider before I take this medicine? They need to know if you have any of these conditions: -asthma, especially aspirin-sensitive asthma -bleeding problems -kidney disease -stomach bleed, ulcer, or other problem -taking aspirin, other NSAID, or probenecid -an unusual or allergic reaction to ketorolac, tromethamine, aspirin, other NSAIDs, other medicines, foods, dyes or preservatives -pregnant or trying to get pregnant -breast-feeding How should  I use this medicine? This medicine is for injection into a muscle or into a vein. It is given by a health care professional in a hospital or clinic setting. Talk to your pediatrician regarding the use of this medicine in children. Special care may be needed. Patients over 22 years old may have a stronger reaction and need a smaller dose. Overdosage: If you think you have taken too much of this medicine contact a poison control center or emergency room at once. NOTE: This medicine is only for you. Do  not share this medicine with others. What if I Michael a dose? This does not apply. What may interact with this medicine? Do not take this medicine with any of the following medications: -aspirin and aspirin-like medicines -cidofovir -methotrexate -NSAIDs, medicines for pain and inflammation, like ibuprofen or naproxen -pentoxifylline -probenecid This medicine may also interact with the following medications: -alcohol -alendronate -alprazolam -carbamazepine -diuretics -flavocoxid -fluoxetine -ginkgo -lithium -medicines for blood pressure like enalapril -medicines that affect platelets like pentoxifylline -medicines that treat or prevent blood clots like heparin, warfarin -muscle relaxants -pemetrexed -phenytoin -thiothixene This list may not describe all possible interactions. Give your health care provider a list of all the medicines, herbs, non-prescription drugs, or dietary supplements you use. Also tell them if you smoke, drink alcohol, or use illegal drugs. Some items may interact with your medicine. What should I watch for while using this medicine? Tell your doctor or healthcare professional if your symptoms do not start to get better or if they get worse. This medicine does not prevent heart attack or stroke. In fact, this medicine may increase the chance of a heart attack or stroke. The chance may increase with longer use of this medicine and in people who have heart disease. If you take aspirin to prevent heart attack or stroke, talk with your doctor or health care professional. Do not take medicines such as ibuprofen and naproxen with this medicine. Side effects such as stomach upset, nausea, or ulcers may be more likely to occur. Many medicines available without a prescription should not be taken with this medicine. This medicine can cause ulcers and bleeding in the stomach and intestines at any time during treatment. Do not smoke cigarettes or drink alcohol. These increase  irritation to your stomach and can make it more susceptible to damage from this medicine. Ulcers and bleeding can happen without warning symptoms and can cause death. This medicine can cause you to bleed more easily. Try to avoid damage to your teeth and gums when you brush or floss your teeth. What side effects may I notice from receiving this medicine? Side effects that you should report to your doctor or health care professional as soon as possible: -allergic reactions like skin rash, itching or hives, swelling of the face, lips, or tongue -breathing problems -high blood pressure -nausea, vomiting -redness, blistering, peeling or loosening of the skin, including inside the mouth -severe stomach pain -signs and symptoms of bleeding such as bloody or black, tarry stools; red or dark-brown urine; spitting up blood or brown material that looks like coffee grounds; red spots on the skin; unusual bruising or bleeding from the eye, gums, or nose -signs and symptoms of a blood clot changes in vision; chest pain; severe, sudden headache; trouble speaking; sudden numbness or weakness of the face, arm, or leg -trouble passing urine or change in the amount of urine -unexplained weight gain or swelling -unusually weak or tired -yellowing of eyes or skin Side effects  that usually do not require medical attention (report to your doctor or health care professional if they continue or are bothersome): -diarrhea -dizziness -headache -heartburn This list may not describe all possible side effects. Call your doctor for medical advice about side effects. You may report side effects to FDA at 1-800-FDA-1088. Where should I keep my medicine? This drug is given in a hospital or clinic and will not be stored at home. NOTE: This sheet is a summary. It may not cover all possible information. If you have questions about this medicine, talk to your doctor, pharmacist, or health care provider.  2018 Elsevier/Gold  Standard (2016-08-09 14:38:40)

## 2018-06-12 MED ORDER — CYCLOBENZAPRINE HCL 10 MG PO TABS: 10.0000 mg | ORAL_TABLET | Freq: Two times a day (BID) | ORAL | 0 refills | Status: DC | PRN

## 2018-06-12 NOTE — Progress Notes (Signed)
Patient notified of xray results & recommendations. Expressed understanding. 

## 2018-06-12 NOTE — Addendum Note (Signed)
Addended by: Scot Jun on: 06/12/2018 05:01 PM   Modules accepted: Orders

## 2018-06-17 ENCOUNTER — Other Ambulatory Visit: Payer: Self-pay | Admitting: Orthopedic Surgery

## 2018-06-17 DIAGNOSIS — M25512 Pain in left shoulder: Secondary | ICD-10-CM

## 2018-06-20 DIAGNOSIS — R197 Diarrhea, unspecified: Secondary | ICD-10-CM | POA: Diagnosis not present

## 2018-06-20 DIAGNOSIS — K5730 Diverticulosis of large intestine without perforation or abscess without bleeding: Secondary | ICD-10-CM | POA: Diagnosis not present

## 2018-06-20 DIAGNOSIS — K5900 Constipation, unspecified: Secondary | ICD-10-CM | POA: Diagnosis not present

## 2018-06-20 DIAGNOSIS — Z800 Family history of malignant neoplasm of digestive organs: Secondary | ICD-10-CM | POA: Diagnosis not present

## 2018-06-26 DIAGNOSIS — K5730 Diverticulosis of large intestine without perforation or abscess without bleeding: Secondary | ICD-10-CM | POA: Diagnosis not present

## 2018-06-26 DIAGNOSIS — N824 Other female intestinal-genital tract fistulae: Secondary | ICD-10-CM | POA: Diagnosis not present

## 2018-06-26 DIAGNOSIS — Z86010 Personal history of colonic polyps: Secondary | ICD-10-CM | POA: Diagnosis not present

## 2018-06-27 ENCOUNTER — Ambulatory Visit: Payer: PPO | Admitting: Family Medicine

## 2018-06-29 ENCOUNTER — Ambulatory Visit
Admission: RE | Admit: 2018-06-29 | Discharge: 2018-06-29 | Disposition: A | Discharge status: Home / Self Care | Payer: PPO | Source: Ambulatory Visit | Attending: Orthopedic Surgery | Admitting: Orthopedic Surgery

## 2018-06-29 DIAGNOSIS — M75122 Complete rotator cuff tear or rupture of left shoulder, not specified as traumatic: Secondary | ICD-10-CM | POA: Diagnosis not present

## 2018-06-29 DIAGNOSIS — M25512 Pain in left shoulder: Secondary | ICD-10-CM

## 2018-07-02 DIAGNOSIS — M25512 Pain in left shoulder: Secondary | ICD-10-CM | POA: Diagnosis not present

## 2018-07-04 DIAGNOSIS — K5669 Other partial intestinal obstruction: Secondary | ICD-10-CM | POA: Diagnosis not present

## 2018-07-04 DIAGNOSIS — N938 Other specified abnormal uterine and vaginal bleeding: Secondary | ICD-10-CM | POA: Diagnosis not present

## 2018-07-04 DIAGNOSIS — K5790 Diverticulosis of intestine, part unspecified, without perforation or abscess without bleeding: Secondary | ICD-10-CM | POA: Diagnosis not present

## 2018-07-04 DIAGNOSIS — Z86010 Personal history of colonic polyps: Secondary | ICD-10-CM | POA: Diagnosis not present

## 2018-07-16 DIAGNOSIS — Z01818 Encounter for other preprocedural examination: Secondary | ICD-10-CM | POA: Diagnosis not present

## 2018-07-25 DIAGNOSIS — N824 Other female intestinal-genital tract fistulae: Secondary | ICD-10-CM | POA: Diagnosis not present

## 2018-07-25 DIAGNOSIS — M6289 Other specified disorders of muscle: Secondary | ICD-10-CM | POA: Diagnosis not present

## 2018-07-25 DIAGNOSIS — K5730 Diverticulosis of large intestine without perforation or abscess without bleeding: Secondary | ICD-10-CM | POA: Diagnosis not present

## 2018-07-25 DIAGNOSIS — K5790 Diverticulosis of intestine, part unspecified, without perforation or abscess without bleeding: Secondary | ICD-10-CM | POA: Diagnosis not present

## 2018-07-26 ENCOUNTER — Other Ambulatory Visit: Payer: Self-pay

## 2018-07-29 ENCOUNTER — Encounter: Payer: Self-pay | Admitting: Family Medicine

## 2018-07-29 ENCOUNTER — Ambulatory Visit (INDEPENDENT_AMBULATORY_CARE_PROVIDER_SITE_OTHER): Payer: PPO | Admitting: Family Medicine

## 2018-07-29 VITALS — BP 111/77 | HR 91 | Resp 17 | Ht 63.0 in | Wt 204.0 lb

## 2018-07-29 DIAGNOSIS — E119 Type 2 diabetes mellitus without complications: Secondary | ICD-10-CM

## 2018-07-29 DIAGNOSIS — E1169 Type 2 diabetes mellitus with other specified complication: Secondary | ICD-10-CM | POA: Diagnosis not present

## 2018-07-29 DIAGNOSIS — I10 Essential (primary) hypertension: Secondary | ICD-10-CM | POA: Diagnosis not present

## 2018-07-29 DIAGNOSIS — E1165 Type 2 diabetes mellitus with hyperglycemia: Secondary | ICD-10-CM | POA: Diagnosis not present

## 2018-07-29 DIAGNOSIS — E039 Hypothyroidism, unspecified: Secondary | ICD-10-CM | POA: Diagnosis not present

## 2018-07-29 DIAGNOSIS — E785 Hyperlipidemia, unspecified: Secondary | ICD-10-CM

## 2018-07-29 LAB — POCT URINALYSIS DIP (CLINITEK)
Bilirubin, UA: NEGATIVE
Blood, UA: NEGATIVE
Glucose, UA: NEGATIVE mg/dL
Nitrite, UA: NEGATIVE
POC PROTEIN,UA: NEGATIVE
Spec Grav, UA: 1.025 (ref 1.010–1.025)
Urobilinogen, UA: 0.2 E.U./dL
pH, UA: 6 (ref 5.0–8.0)

## 2018-07-29 LAB — GLUCOSE, POCT (MANUAL RESULT ENTRY): POC Glucose: 183 mg/dl — AB (ref 70–99)

## 2018-07-29 MED ORDER — BLOOD GLUCOSE METER KIT: PACK | 0 refills | Status: DC

## 2018-07-29 MED ORDER — HYDROCHLOROTHIAZIDE 25 MG PO TABS: 25.0000 mg | ORAL_TABLET | Freq: Every day | ORAL | 11 refills | Status: DC

## 2018-07-29 MED ORDER — LEVOTHYROXINE SODIUM 50 MCG PO TABS: 50.0000 ug | ORAL_TABLET | Freq: Every day | ORAL | 11 refills | Status: DC

## 2018-07-29 MED ORDER — METFORMIN HCL 1000 MG PO TABS: 1000.0000 mg | ORAL_TABLET | Freq: Two times a day (BID) | ORAL | 1 refills | Status: DC

## 2018-07-29 MED ORDER — GLIPIZIDE ER 5 MG PO TB24: 5.0000 mg | ORAL_TABLET | Freq: Every day | ORAL | 0 refills | Status: DC

## 2018-07-29 MED ORDER — ATORVASTATIN CALCIUM 80 MG PO TABS: 80.0000 mg | ORAL_TABLET | Freq: Every day | ORAL | 1 refills | Status: DC

## 2018-07-29 MED ORDER — CYCLOBENZAPRINE HCL 10 MG PO TABS: 10.0000 mg | ORAL_TABLET | Freq: Two times a day (BID) | ORAL | 0 refills | Status: DC | PRN

## 2018-07-29 MED ORDER — ACETAMINOPHEN-CODEINE #3 300-30 MG PO TABS: 1.0000 | ORAL_TABLET | ORAL | 0 refills | Status: DC | PRN

## 2018-07-29 MED FILL — ATORVASTATIN 80 MG TABLET: 80 | 90 days supply | Qty: 90 | Fill #0

## 2018-07-29 NOTE — Patient Instructions (Signed)
Shoulder Pain Many things can cause shoulder pain, including:  An injury.  Moving the arm in the same way again and again (overuse).  Joint pain (arthritis).  Follow these instructions at home: Take these actions to help with your pain:  Squeeze a soft ball or a foam pad as much as you can. This helps to prevent swelling. It also makes the arm stronger.  Take over-the-counter and prescription medicines only as told by your doctor.  If told, put ice on the area: ? Put ice in a plastic bag. ? Place a towel between your skin and the bag. ? Leave the ice on for 20 minutes, 2-3 times per day. Stop putting on ice if it does not help with the pain.  If you were given a shoulder sling or immobilizer: ? Wear it as told. ? Remove it to shower or bathe. ? Move your arm as little as possible. ? Keep your hand moving. This helps prevent swelling.  Contact a doctor if:  Your pain gets worse.  Medicine does not help your pain.  You have new pain in your arm, hand, or fingers. Get help right away if:  Your arm, hand, or fingers: ? Tingle. ? Are numb. ? Are swollen. ? Are painful. ? Turn white or blue. This information is not intended to replace advice given to you by your health care provider. Make sure you discuss any questions you have with your health care provider. Document Released: 01/17/2008 Document Revised: 03/26/2016 Document Reviewed: 11/23/2014 Elsevier Interactive Patient Education  2018 [place] Eating Plan DASH stands for "Dietary Approaches to Stop Hypertension." The DASH eating plan is a healthy eating plan that has been shown to reduce high blood pressure (hypertension). It may also reduce your risk for type 2 diabetes, heart disease, and stroke. The DASH eating plan may also help with weight loss. What are tips for following this plan? General guidelines  Avoid eating more than 2,300 mg (milligrams) of salt (sodium) a day. If you have  hypertension, you may need to reduce your sodium intake to 1,500 mg a day.  Limit alcohol intake to no more than 1 drink a day for nonpregnant women and 2 drinks a day for men. One drink equals 12 oz of beer, 5 oz of wine, or 1 oz of hard liquor.  Work with your health care provider to maintain a healthy body weight or to lose weight. Ask what an ideal weight is for you.  Get at least 30 minutes of exercise that causes your heart to beat faster (aerobic exercise) most days of the week. Activities may include walking, swimming, or biking.  Work with your health care provider or diet and nutrition specialist (dietitian) to adjust your eating plan to your individual calorie needs. Reading food labels  Check food labels for the amount of sodium per serving. Choose foods with less than 5 percent of the Daily Value of sodium. Generally, foods with less than 300 mg of sodium per serving fit into this eating plan.  To find whole grains, look for the word "whole" as the first word in the ingredient list. Shopping  Buy products labeled as "low-sodium" or "no salt added."  Buy fresh foods. Avoid canned foods and premade or frozen meals. Cooking  Avoid adding salt when cooking. Use salt-free seasonings or herbs instead of table salt or sea salt. Check with your health care provider or pharmacist before using salt substitutes.  Do not fry  foods. Lacinda Axon foods using healthy methods such as baking, boiling, grilling, and broiling instead.  Cook with heart-healthy oils, such as olive, canola, soybean, or sunflower oil. Meal planning   Eat a balanced diet that includes: ? 5 or more servings of fruits and vegetables each day. At each meal, try to fill half of your plate with fruits and vegetables. ? Up to 6-8 servings of whole grains each day. ? Less than 6 oz of lean meat, poultry, or fish each day. A 3-oz serving of meat is about the same size as a deck of cards. One egg equals 1 oz. ? 2 servings of  low-fat dairy each day. ? A serving of nuts, seeds, or beans 5 times each week. ? Heart-healthy fats. Healthy fats called Omega-3 fatty acids are found in foods such as flaxseeds and coldwater fish, like sardines, salmon, and mackerel.  Limit how much you eat of the following: ? Canned or prepackaged foods. ? Food that is high in trans fat, such as fried foods. ? Food that is high in saturated fat, such as fatty meat. ? Sweets, desserts, sugary drinks, and other foods with added sugar. ? Full-fat dairy products.  Do not salt foods before eating.  Try to eat at least 2 vegetarian meals each week.  Eat more home-cooked food and less restaurant, buffet, and fast food.  When eating at a restaurant, ask that your food be prepared with less salt or no salt, if possible. What foods are recommended? The items listed may not be a complete list. Talk with your dietitian about what dietary choices are best for you. Grains Whole-grain or whole-wheat bread. Whole-grain or whole-wheat pasta. Brown rice. Modena Morrow. Bulgur. Whole-grain and low-sodium cereals. Pita bread. Low-fat, low-sodium crackers. Whole-wheat flour tortillas. Vegetables Fresh or frozen vegetables (raw, steamed, roasted, or grilled). Low-sodium or reduced-sodium tomato and vegetable juice. Low-sodium or reduced-sodium tomato sauce and tomato paste. Low-sodium or reduced-sodium canned vegetables. Fruits All fresh, dried, or frozen fruit. Canned fruit in natural juice (without added sugar). Meat and other protein foods Skinless chicken or [place]. Ground chicken or [place]. Pork with fat trimmed off. Fish and seafood. Egg whites. Dried beans, peas, or lentils. Unsalted nuts, nut butters, and seeds. Unsalted canned beans. Lean cuts of beef with fat trimmed off. Low-sodium, lean deli meat. Dairy Low-fat (1%) or fat-free (skim) milk. Fat-free, low-fat, or reduced-fat cheeses. Nonfat, low-sodium ricotta or cottage cheese. Low-fat or  nonfat yogurt. Low-fat, low-sodium cheese. Fats and oils Soft margarine without trans fats. Vegetable oil. Low-fat, reduced-fat, or light mayonnaise and salad dressings (reduced-sodium). Canola, safflower, olive, soybean, and sunflower oils. Avocado. Seasoning and other foods Herbs. Spices. Seasoning mixes without salt. Unsalted popcorn and pretzels. Fat-free sweets. What foods are not recommended? The items listed may not be a complete list. Talk with your dietitian about what dietary choices are best for you. Grains Baked goods made with fat, such as croissants, muffins, or some breads. Dry pasta or rice meal packs. Vegetables Creamed or fried vegetables. Vegetables in a cheese sauce. Regular canned vegetables (not low-sodium or reduced-sodium). Regular canned tomato sauce and paste (not low-sodium or reduced-sodium). Regular tomato and vegetable juice (not low-sodium or reduced-sodium). Angie Fava. Olives. Fruits Canned fruit in a light or heavy syrup. Fried fruit. Fruit in cream or butter sauce. Meat and other protein foods Fatty cuts of meat. Ribs. Fried meat. Berniece Salines. Sausage. Bologna and other processed lunch meats. Salami. Fatback. Hotdogs. Bratwurst. Salted nuts and seeds. Canned beans with added salt.  Canned or smoked fish. Whole eggs or egg yolks. Chicken or [place] with skin. Dairy Whole or 2% milk, cream, and half-and-half. Whole or full-fat cream cheese. Whole-fat or sweetened yogurt. Full-fat cheese. Nondairy creamers. Whipped toppings. Processed cheese and cheese spreads. Fats and oils Butter. Stick margarine. Lard. Shortening. Ghee. Bacon fat. Tropical oils, such as coconut, palm kernel, or palm oil. Seasoning and other foods Salted popcorn and pretzels. Onion salt, garlic salt, seasoned salt, table salt, and sea salt. Worcestershire sauce. Tartar sauce. Barbecue sauce. Teriyaki sauce. Soy sauce, including reduced-sodium. Steak sauce. Canned and packaged gravies. Fish sauce. Oyster  sauce. Cocktail sauce. Horseradish that you find on the shelf. Ketchup. Mustard. Meat flavorings and tenderizers. Bouillon cubes. Hot sauce and Tabasco sauce. Premade or packaged marinades. Premade or packaged taco seasonings. Relishes. Regular salad dressings. Where to find more information:  National Heart, Lung, and [place]: https://wilson-eaton.com/  American Heart Association: www.heart.org Summary  The DASH eating plan is a healthy eating plan that has been shown to reduce high blood pressure (hypertension). It may also reduce your risk for type 2 diabetes, heart disease, and stroke.  With the DASH eating plan, you should limit salt (sodium) intake to 2,300 mg a day. If you have hypertension, you may need to reduce your sodium intake to 1,500 mg a day.  When on the DASH eating plan, aim to eat more fresh fruits and vegetables, whole grains, lean proteins, low-fat dairy, and heart-healthy fats.  Work with your health care provider or diet and nutrition specialist (dietitian) to adjust your eating plan to your individual calorie needs. This information is not intended to replace advice given to you by your health care provider. Make sure you discuss any questions you have with your health care provider. Document Released: 07/20/2011 Document Revised: 07/24/2016 Document Reviewed: 07/24/2016 Elsevier Interactive Patient Education  [employer].

## 2018-07-29 NOTE — Progress Notes (Signed)
Established Patient Office Visit  Subjective:  Patient ID: Laurie Michael, female    DOB: [birth year]  Age: 67 y.o. MRN: [id number]  CC:  Chief Complaint  Patient presents with  . Diabetes    FSBS have been running in the 170s in the AMs  . Hypertension  . Hyperlipidemia    HPI Laurie Michael presents for evaluation of diabetes, hypertension, hypothyroidism, and hyperlipidemia.   Diabetes Monitors glucose at home. Blood sugar readings fasting have been 170. She is adheres to current medication regimen. Last A1C 8.4, 5 months prior. Current Body mass index is 36.14 kg/m.  Concern that blood sugars are unstable.  She admits to recent stress as a caregiver of both her mother and her husband who both have some chronic conditions.  She feels this may be related to elevation in blood sugars.  She admits to no routine physical activity as she was previously at least walking on the treadmill a few times weekly.  At present due to her chronic shoulder pain and current abdominal problems she has not been able to exercise.  She denies polyuria, polyphagia or neuropathic pain at present.  She denies any prior insulin use.  Hypertension  Blood pressure has been well controlled for the most part.  She denies any chest pain, shortness of breath, lower extremity edema.  She is compliant with medication and is a non-smoker.  She restrict salt from her diet.  She also suffers from hyperlipidemia and is concerned as she is prescribed atorvastatin 80 mg which is the max dose.  She denies any myalgias or arthralgias associated medication however she is concerned at the cost of the medication and the high dose.  In review of EMR patient had grossly elevated cholesterol panels approximately 2 years ago which was when the medication was initiated.  The 10-year ASCVD risk score Mikey Bussing DC Jr., et al., 2013) is: 14.5%, based on values from last lipid panel.  She is not fasting today.   Hypothyroidism Last TSH level  checked over 6 months ago.  Patient will be undergoing surgery within the next 3 weeks and needs refills on her thyroid medication.  Last TSH level was therapeutic at 3.290.  She is currently prescribed levothyroxine 50 mcg daily which she is compliant with.   Past Medical History:  Diagnosis Date  . Complication of anesthesia   . Diabetes mellitus without complication ([place])   . Family history of adverse reaction to anesthesia    mom is slow to wake up  . Hypertension   . Irritable bowel   . Obesity   . PONV (postoperative nausea and vomiting)     Past Surgical History:  Procedure Laterality Date  . CATARACT EXTRACTION, BILATERAL  2017  . COLONOSCOPY N/A 07/12/2016   Procedure: COLONOSCOPY;  Surgeon: Wonda Horner, MD;  Location: [facility] ENDOSCOPY;  Service: Endoscopy;  Laterality: N/A;  . HYSTEROSCOPY W/ ENDOMETRIAL ABLATION    . TUBAL LIGATION    . WISDOM TOOTH EXTRACTION      Family History  Problem Relation Age of Onset  . Diabetes Mother   . Breast cancer Mother   . Heart disease Father   . Cancer Brother   . Breast cancer Sister     Social History   Socioeconomic History  . Marital status: Married    Spouse name: Not on file  . Number of children: Not on file  . Years of education: Not on file  . Highest education level: Not on  file  Occupational History  . Not on file  Social Needs  . Financial resource strain: Not on file  . Food insecurity:    Worry: Not on file    Inability: Not on file  . Transportation needs:    Medical: Not on file    Non-medical: Not on file  Tobacco Use  . Smoking status: Former Smoker    Packs/day: 2.00    Years: 27.00    Pack years: 54.00    Types: Cigarettes    Last attempt to quit: 01/10/2013    Years since quitting: 5.5  . Smokeless tobacco: Never Used  Substance and Sexual Activity  . Alcohol use: No    Alcohol/week: 0.0 standard drinks  . Drug use: No  . Sexual activity: Yes    Birth control/protection: None   Lifestyle  . Physical activity:    Days per week: Not on file    Minutes per session: Not on file  . Stress: Not on file  Relationships  . Social connections:    Talks on phone: Not on file    Gets together: Not on file    Attends religious service: Not on file    Active member of club or organization: Not on file    Attends meetings of clubs or organizations: Not on file    Relationship status: Not on file  . Intimate partner violence:    Fear of current or ex partner: Not on file    Emotionally abused: Not on file    Physically abused: Not on file    Forced sexual activity: Not on file  Other Topics Concern  . Not on file  Social History Narrative  . Not on file    Outpatient Medications Prior to Visit  Medication Sig Dispense Refill  . Ascorbic Acid (VITAMIN C) 1000 MG tablet Take 1,000 mg by mouth daily.    Marland Kitchen atorvastatin (LIPITOR) 80 MG tablet Take 1 tablet (80 mg total) by mouth daily. 90 tablet 4  . calcium-vitamin D (OSCAL WITH D) 500-200 MG-UNIT tablet Take 2 tablets by mouth daily.    . cyclobenzaprine (FLEXERIL) 10 MG tablet Take 1 tablet (10 mg total) by mouth 2 (two) times daily as needed for muscle spasms (may cause drowsiness). 60 tablet 0  . docusate sodium (COLACE) 100 MG capsule Take 1 capsule (100 mg total) by mouth 2 (two) times daily as needed for mild constipation or moderate constipation. 60 capsule 11  . glipiZIDE (GLUCOTROL XL) 5 MG 24 hr tablet TAKE 1 TABLET BY MOUTH ONCE DAILY 90 tablet 0  . glucose blood (ONE TOUCH ULTRA TEST) test strip Use to check blood sugar once daily. Diagnosis code E11.9 100 each 12  . hydrochlorothiazide (HYDRODIURIL) 25 MG tablet Take 1 tablet (25 mg total) by mouth daily. 30 tablet 11  . levothyroxine (SYNTHROID) 50 MCG tablet Take 1 tablet (50 mcg total) by mouth daily. 30 tablet 11  . magnesium 30 MG tablet Take 30 mg by mouth 2 (two) times daily.    . metFORMIN (GLUCOPHAGE) 1000 MG tablet Take 1 tablet (1,000 mg total) by  mouth 2 (two) times daily with a meal. 180 tablet 2  . naproxen (NAPROSYN) 500 MG tablet Take 1 tablet (500 mg total) by mouth 2 (two) times daily with a meal. 60 tablet 2   No facility-administered medications prior to visit.     No Known Allergies  ROS Review of Systems Pertinent negatives listed in HPI   Objective:  Physical Exam BP 111/77   Pulse 91   Resp 17   Ht _0  (1.6 m)   Wt 204 lb (92.5 kg)   SpO2 96%   BMI 36.14 kg/m  Wt Readings from Last 3 Encounters:  07/29/18 204 lb (92.5 kg)  06/11/18 213 lb 6.4 oz (96.8 kg)  05/08/18 211 lb 9.6 oz (96 kg)     Health Maintenance Due  Topic Date Due  . TETANUS/TDAP  04/17/1971  . OPHTHALMOLOGY EXAM  04/18/2017  . URINE MICROALBUMIN  01/30/2018  . HEMOGLOBIN A1C  04/25/2018  . FOOT EXAM  07/17/2018     Lab Results  Component Value Date   TSH 3.290 05/08/2018   Lab Results  Component Value Date   WBC 9.4 05/08/2018   HGB 14.0 05/08/2018   HCT 41.3 05/08/2018   MCV 88 05/08/2018   PLT 448 05/08/2018   Lab Results  Component Value Date   NA 140 05/08/2018   K 4.7 05/08/2018   CO2 24 05/08/2018   GLUCOSE 186 (H) 05/08/2018   BUN 15 05/08/2018   CREATININE 0.67 05/08/2018   BILITOT 0.3 05/08/2018   ALKPHOS 203 (H) 05/08/2018   ALKPHOS 194 (H) 05/08/2018   AST 17 05/08/2018   ALT 24 05/08/2018   PROT 7.0 05/08/2018   ALBUMIN 3.9 05/08/2018   CALCIUM 9.4 05/08/2018   ANIONGAP 12 02/29/2016   Lab Results  Component Value Date   CHOL 241 (H) 02/29/2016   Lab Results  Component Value Date   HDL 39 (L) 02/29/2016   Lab Results  Component Value Date   LDLCALC 128 (H) 02/29/2016   Lab Results  Component Value Date   TRIG 369 (H) 02/29/2016   Lab Results  Component Value Date   CHOLHDL 6.2 (H) 02/29/2016   Lab Results  Component Value Date   HGBA1C 8.4 (A) 01/23/2018      Assessment & Plan:   Problem List Items Addressed This Visit      Cardiovascular and Mediastinum   Essential  hypertension, well controlled. We have discussed target BP range and blood pressure goal. I have advised patient to check BP regularly and to call us back or report to clinic if the numbers are consistently higher than 140/90. We discussed the importance of compliance with medical therapy and DASH diet recommended, consequences of uncontrolled hypertension discussed.    Relevant Medications   hydrochlorothiazide (HYDRODIURIL) 25 MG tablet   atorvastatin (LIPITOR) 80 MG tablet   Other Relevant Orders   POCT URINALYSIS DIP (CLINITEK) (Completed)     Endocrine   Hyperlipidemia associated with type 2 diabetes mellitus ([place]) Patient is not fasting today therefore will not repeat lipid panel today.  Patient will have a subsequent follow-up visit once she has completed her abdominal surgery.  Advised that during that visit to follow-up fasting and I will repeat a lipid panel and if within a acceptable level and AVCD risk factor score is reduced will consider reducing dose atorvastatin.   Relevant Medications   glipiZIDE (GLUCOTROL XL) 5 MG 24 hr tablet   metFORMIN (GLUCOPHAGE) 1000 MG tablet   atorvastatin (LIPITOR) 80 MG tablet   Hypothyroidism,    Relevant Medications   levothyroxine (SYNTHROID) 50 MCG tablet   Other Relevant Orders   Thyroid Panel With TSH, rechecking level. Will adjust dose if TSH is non-therapeutic    Type 2 diabetes mellitus (H[facility]) - Primary (Chronic) Last hemoglobin A1c was 8.4 indicating poor control of diabetes.  We will  repeat A1c today given recent elevations in daily blood glucose checks.  If A1c is not therapeutic will increase glipizide to 10 mg once daily.  Repeat A1c in 2 to 3 months   Relevant Medications   glipiZIDE (GLUCOTROL XL) 5 MG 24 hr tablet   metFORMIN (GLUCOPHAGE) 1000 MG tablet   atorvastatin (LIPITOR) 80 MG tablet   Other Relevant Orders   Glucose (CBG) (Completed)   Hemoglobin A1c (Completed)   Comprehensive metabolic panel (Completed)    Microalbumin, urine (Completed)   POCT URINALYSIS DIP (CLINITEK) (Completed)      Meds ordered this encounter  Medications  . blood glucose meter kit and supplies    Sig: Dispense based on patient and insurance preference. Use up to four times daily as directed. (FOR ICD-10 E10.9, E11.9).    Dispense:  1 each    Refill:  0    Order Specific Question:   Number of strips    Answer:   200    Order Specific Question:   Number of lancets    Answer:   200  . glipiZIDE (GLUCOTROL XL) 5 MG 24 hr tablet    Sig: Take 1 tablet (5 mg total) by mouth daily.    Dispense:  90 tablet    Refill:  0  . metFORMIN (GLUCOPHAGE) 1000 MG tablet    Sig: Take 1 tablet (1,000 mg total) by mouth 2 (two) times daily with a meal.    Dispense:  180 tablet    Refill:  1  . hydrochlorothiazide (HYDRODIURIL) 25 MG tablet    Sig: Take 1 tablet (25 mg total) by mouth daily.    Dispense:  30 tablet    Refill:  11  . levothyroxine (SYNTHROID) 50 MCG tablet    Sig: Take 1 tablet (50 mcg total) by mouth daily.    Dispense:  30 tablet    Refill:  11  . DISCONTD: atorvastatin (LIPITOR) 80 MG tablet    Sig: Take 1 tablet (80 mg total) by mouth daily at 6 PM.    Dispense:  90 tablet    Refill:  1  . cyclobenzaprine (FLEXERIL) 10 MG tablet    Sig: Take 1 tablet (10 mg total) by mouth 2 (two) times daily as needed for muscle spasms.    Dispense:  60 tablet    Refill:  0  . acetaminophen-codeine (TYLENOL #3) 300-30 MG tablet    Sig: Take 1 tablet by mouth every 4 (four) hours as needed for moderate pain.    Dispense:  30 tablet    Refill:  0  . atorvastatin (LIPITOR) 80 MG tablet    Sig: Take 1 tablet (80 mg total) by mouth daily at 6 PM.    Dispense:  90 tablet    Refill:  1    Follow-up: Return for schedule anytime after 1/17 for post op follow-up and fasting  lipid .    Molli Barrows, FNP Primary Care at [facility] [address] [place] [phone]: [phone]

## 2018-07-30 LAB — COMPREHENSIVE METABOLIC PANEL
ALT: 8 IU/L (ref 0–32)
AST: 19 IU/L (ref 0–40)
Albumin/Globulin Ratio: 1.7 (ref 1.2–2.2)
Albumin: 3.8 g/dL (ref 3.6–4.8)
Alkaline Phosphatase: 70 IU/L (ref 39–117)
BUN/Creatinine Ratio: 31 — ABNORMAL HIGH (ref 12–28)
BUN: 54 mg/dL — ABNORMAL HIGH (ref 8–27)
Bilirubin Total: 0.4 mg/dL (ref 0.0–1.2)
CO2: 27 mmol/L (ref 20–29)
CREATININE: 1.72 mg/dL — AB (ref 0.57–1.00)
Calcium: 8.9 mg/dL (ref 8.7–10.3)
Chloride: 97 mmol/L (ref 96–106)
GFR calc Af Amer: 35 mL/min/{1.73_m2} — ABNORMAL LOW (ref >59)
GFR calc non Af Amer: 31 mL/min/{1.73_m2} — ABNORMAL LOW (ref >59)
GLOBULIN, TOTAL: 2.3 g/dL (ref 1.5–4.5)
Glucose: 124 mg/dL — ABNORMAL HIGH (ref 65–99)
POTASSIUM: 4.5 mmol/L (ref 3.5–5.2)
SODIUM: 139 mmol/L (ref 134–144)
Total Protein: 6.1 g/dL (ref 6.0–8.5)

## 2018-07-30 LAB — THYROID PANEL WITH TSH
Free Thyroxine Index: 1.6 (ref 1.2–4.9)
T3 Uptake Ratio: 24 % (ref 24–39)
T4, Total: 6.7 ug/dL (ref 4.5–12.0)
TSH: 4.78 u[IU]/mL — ABNORMAL HIGH (ref 0.450–4.500)

## 2018-07-30 LAB — HEMOGLOBIN A1C
Est. average glucose Bld gHb Est-mCnc: 206 mg/dL
HEMOGLOBIN A1C: 8.8 % — AB (ref 4.8–5.6)

## 2018-07-30 LAB — MICROALBUMIN, URINE: Microalbumin, Urine: 15 ug/mL

## 2018-07-31 ENCOUNTER — Other Ambulatory Visit: Payer: Self-pay | Admitting: Family Medicine

## 2018-07-31 DIAGNOSIS — E119 Type 2 diabetes mellitus without complications: Secondary | ICD-10-CM

## 2018-07-31 DIAGNOSIS — E039 Hypothyroidism, unspecified: Secondary | ICD-10-CM

## 2018-07-31 NOTE — Telephone Encounter (Signed)
Diabetes: your A1C has increased from 8.4 to 8.8 which means your diabetes has worsened, I am increasing Glipizide 10 mg (take two of the  5 mg tablet with breakfast daily). I will recheck A1C in 6-8 weeks if no improvement, long-acting insulin is recommended.if she experiences low blood sugars (<100) , stop glipizide and notify me by phone.   Renal Function:Your kidney function is abnormal. I'm concerned the hydrochlorothiazide is dehydrating you kidneys, therefore I am discontinuing Hydrochlorothiazide and starting you on lisinopril. I am also concern that this is related to recent increased use of NSAID related to shoulder pain. She is not to take ibuprofen or Naproxen (Aleve). Only take the tylenol 3 and flexeril she is prescribed.   Thyroid function is abnormal. I would like to increase your levothyroxine from 50 mcg to 75 mcg. Take 1.5 mg of your current prescription or if you have picked up your refills, I will send over a revised prescription for levothyroxine 75 mcg  She is scheduled for surgery  On 1/3/201, therefore she will need to come into office on 12/31 to have  labs completed for evaluation of renal function.

## 2018-08-02 NOTE — Telephone Encounter (Signed)
Received the following error message when attempting to call patient: "we're sorry, your call can not be completed at this time. Please hang up & try again later."

## 2018-08-04 NOTE — Telephone Encounter (Signed)
Try and call any other numbers listed in demographic, if still unable to contact, mail a letter advising patient to call office regarding recent labs

## 2018-08-06 ENCOUNTER — Encounter: Payer: Self-pay | Admitting: Family Medicine

## 2018-08-08 NOTE — Telephone Encounter (Signed)
Received the following error message when attempting to call patient: "we're sorry, your call can not be completed at this time. Please hang up & try again later."

## 2018-08-08 NOTE — Telephone Encounter (Signed)
Patient notified of lab results & recommendations. Expressed understanding. 

## 2018-08-09 DIAGNOSIS — N824 Other female intestinal-genital tract fistulae: Secondary | ICD-10-CM | POA: Diagnosis not present

## 2018-08-09 DIAGNOSIS — Z7984 Long term (current) use of oral hypoglycemic drugs: Secondary | ICD-10-CM | POA: Diagnosis not present

## 2018-08-09 DIAGNOSIS — Z01818 Encounter for other preprocedural examination: Secondary | ICD-10-CM | POA: Diagnosis not present

## 2018-08-09 DIAGNOSIS — K823 Fistula of gallbladder: Secondary | ICD-10-CM | POA: Diagnosis not present

## 2018-08-09 DIAGNOSIS — E119 Type 2 diabetes mellitus without complications: Secondary | ICD-10-CM | POA: Diagnosis not present

## 2018-08-09 MED ORDER — GLIPIZIDE ER 10 MG PO TB24: 10.0000 mg | ORAL_TABLET | Freq: Every day | ORAL | 3 refills | Status: DC

## 2018-08-09 MED ORDER — LEVOTHYROXINE SODIUM 75 MCG PO TABS: 75.0000 ug | ORAL_TABLET | Freq: Every day | ORAL | 11 refills | Status: DC

## 2018-08-09 MED ORDER — LISINOPRIL 10 MG PO TABS: 10.0000 mg | ORAL_TABLET | Freq: Every day | ORAL | 3 refills | Status: DC

## 2018-08-14 DIAGNOSIS — K5790 Diverticulosis of intestine, part unspecified, without perforation or abscess without bleeding: Secondary | ICD-10-CM

## 2018-08-14 HISTORY — DX: Diverticulosis of intestine, part unspecified, without perforation or abscess without bleeding: K57.90

## 2018-08-14 HISTORY — PX: COLON SURGERY: SHX602

## 2018-08-16 DIAGNOSIS — R2681 Unsteadiness on feet: Secondary | ICD-10-CM | POA: Diagnosis not present

## 2018-08-16 DIAGNOSIS — R531 Weakness: Secondary | ICD-10-CM | POA: Diagnosis not present

## 2018-08-16 DIAGNOSIS — Z794 Long term (current) use of insulin: Secondary | ICD-10-CM | POA: Diagnosis not present

## 2018-08-16 DIAGNOSIS — Z9851 Tubal ligation status: Secondary | ICD-10-CM | POA: Diagnosis not present

## 2018-08-16 DIAGNOSIS — Z743 Need for continuous supervision: Secondary | ICD-10-CM | POA: Diagnosis not present

## 2018-08-16 DIAGNOSIS — I10 Essential (primary) hypertension: Secondary | ICD-10-CM | POA: Diagnosis not present

## 2018-08-16 DIAGNOSIS — R278 Other lack of coordination: Secondary | ICD-10-CM | POA: Diagnosis not present

## 2018-08-16 DIAGNOSIS — Z48815 Encounter for surgical aftercare following surgery on the digestive system: Secondary | ICD-10-CM | POA: Diagnosis not present

## 2018-08-16 DIAGNOSIS — K5732 Diverticulitis of large intestine without perforation or abscess without bleeding: Secondary | ICD-10-CM | POA: Diagnosis not present

## 2018-08-16 DIAGNOSIS — R5381 Other malaise: Secondary | ICD-10-CM | POA: Diagnosis not present

## 2018-08-16 DIAGNOSIS — E039 Hypothyroidism, unspecified: Secondary | ICD-10-CM | POA: Diagnosis not present

## 2018-08-16 DIAGNOSIS — E1165 Type 2 diabetes mellitus with hyperglycemia: Secondary | ICD-10-CM | POA: Diagnosis not present

## 2018-08-16 DIAGNOSIS — K56609 Unspecified intestinal obstruction, unspecified as to partial versus complete obstruction: Secondary | ICD-10-CM | POA: Diagnosis not present

## 2018-08-16 DIAGNOSIS — D72829 Elevated white blood cell count, unspecified: Secondary | ICD-10-CM | POA: Diagnosis not present

## 2018-08-16 DIAGNOSIS — N824 Other female intestinal-genital tract fistulae: Secondary | ICD-10-CM | POA: Diagnosis not present

## 2018-08-16 DIAGNOSIS — E119 Type 2 diabetes mellitus without complications: Secondary | ICD-10-CM | POA: Diagnosis not present

## 2018-08-16 DIAGNOSIS — E038 Other specified hypothyroidism: Secondary | ICD-10-CM | POA: Diagnosis not present

## 2018-08-16 DIAGNOSIS — M6281 Muscle weakness (generalized): Secondary | ICD-10-CM | POA: Diagnosis not present

## 2018-08-16 DIAGNOSIS — R110 Nausea: Secondary | ICD-10-CM | POA: Diagnosis not present

## 2018-08-16 DIAGNOSIS — K5790 Diverticulosis of intestine, part unspecified, without perforation or abscess without bleeding: Secondary | ICD-10-CM | POA: Diagnosis not present

## 2018-08-16 DIAGNOSIS — K5730 Diverticulosis of large intestine without perforation or abscess without bleeding: Secondary | ICD-10-CM | POA: Diagnosis not present

## 2018-08-16 DIAGNOSIS — G8918 Other acute postprocedural pain: Secondary | ICD-10-CM | POA: Diagnosis not present

## 2018-08-16 DIAGNOSIS — D259 Leiomyoma of uterus, unspecified: Secondary | ICD-10-CM | POA: Diagnosis not present

## 2018-08-16 DIAGNOSIS — E079 Disorder of thyroid, unspecified: Secondary | ICD-10-CM | POA: Diagnosis not present

## 2018-08-16 DIAGNOSIS — N950 Postmenopausal bleeding: Secondary | ICD-10-CM | POA: Diagnosis not present

## 2018-08-16 DIAGNOSIS — R279 Unspecified lack of coordination: Secondary | ICD-10-CM | POA: Diagnosis not present

## 2018-08-16 DIAGNOSIS — N823 Fistula of vagina to large intestine: Secondary | ICD-10-CM | POA: Diagnosis not present

## 2018-08-16 DIAGNOSIS — R2689 Other abnormalities of gait and mobility: Secondary | ICD-10-CM | POA: Diagnosis not present

## 2018-08-16 DIAGNOSIS — K5669 Other partial intestinal obstruction: Secondary | ICD-10-CM | POA: Diagnosis not present

## 2018-08-22 DIAGNOSIS — Z9049 Acquired absence of other specified parts of digestive tract: Secondary | ICD-10-CM | POA: Insufficient documentation

## 2018-08-24 DIAGNOSIS — R531 Weakness: Secondary | ICD-10-CM | POA: Diagnosis not present

## 2018-08-24 DIAGNOSIS — R278 Other lack of coordination: Secondary | ICD-10-CM | POA: Diagnosis not present

## 2018-08-24 DIAGNOSIS — Z743 Need for continuous supervision: Secondary | ICD-10-CM | POA: Diagnosis not present

## 2018-08-24 DIAGNOSIS — Z9049 Acquired absence of other specified parts of digestive tract: Secondary | ICD-10-CM | POA: Diagnosis not present

## 2018-08-24 DIAGNOSIS — E119 Type 2 diabetes mellitus without complications: Secondary | ICD-10-CM | POA: Diagnosis not present

## 2018-08-24 DIAGNOSIS — M6281 Muscle weakness (generalized): Secondary | ICD-10-CM | POA: Diagnosis not present

## 2018-08-24 DIAGNOSIS — R5381 Other malaise: Secondary | ICD-10-CM | POA: Diagnosis not present

## 2018-08-24 DIAGNOSIS — R279 Unspecified lack of coordination: Secondary | ICD-10-CM | POA: Diagnosis not present

## 2018-08-24 DIAGNOSIS — R2689 Other abnormalities of gait and mobility: Secondary | ICD-10-CM | POA: Diagnosis not present

## 2018-08-24 DIAGNOSIS — E038 Other specified hypothyroidism: Secondary | ICD-10-CM | POA: Diagnosis not present

## 2018-08-24 DIAGNOSIS — Z48815 Encounter for surgical aftercare following surgery on the digestive system: Secondary | ICD-10-CM | POA: Diagnosis not present

## 2018-08-24 DIAGNOSIS — R2681 Unsteadiness on feet: Secondary | ICD-10-CM | POA: Diagnosis not present

## 2018-08-24 DIAGNOSIS — I10 Essential (primary) hypertension: Secondary | ICD-10-CM | POA: Diagnosis not present

## 2018-08-25 DIAGNOSIS — E038 Other specified hypothyroidism: Secondary | ICD-10-CM | POA: Diagnosis not present

## 2018-08-25 DIAGNOSIS — E119 Type 2 diabetes mellitus without complications: Secondary | ICD-10-CM | POA: Diagnosis not present

## 2018-08-25 DIAGNOSIS — Z9049 Acquired absence of other specified parts of digestive tract: Secondary | ICD-10-CM | POA: Diagnosis not present

## 2018-08-25 DIAGNOSIS — I10 Essential (primary) hypertension: Secondary | ICD-10-CM | POA: Diagnosis not present

## 2018-08-28 DIAGNOSIS — Z9049 Acquired absence of other specified parts of digestive tract: Secondary | ICD-10-CM | POA: Diagnosis not present

## 2018-08-28 DIAGNOSIS — I10 Essential (primary) hypertension: Secondary | ICD-10-CM | POA: Diagnosis not present

## 2018-08-28 DIAGNOSIS — E119 Type 2 diabetes mellitus without complications: Secondary | ICD-10-CM | POA: Diagnosis not present

## 2018-08-28 DIAGNOSIS — E038 Other specified hypothyroidism: Secondary | ICD-10-CM | POA: Diagnosis not present

## 2018-08-29 ENCOUNTER — Other Ambulatory Visit: Payer: Self-pay | Admitting: *Deleted

## 2018-08-29 DIAGNOSIS — E119 Type 2 diabetes mellitus without complications: Secondary | ICD-10-CM | POA: Diagnosis not present

## 2018-08-29 DIAGNOSIS — E038 Other specified hypothyroidism: Secondary | ICD-10-CM | POA: Diagnosis not present

## 2018-08-29 DIAGNOSIS — Z9049 Acquired absence of other specified parts of digestive tract: Secondary | ICD-10-CM | POA: Diagnosis not present

## 2018-08-29 DIAGNOSIS — I10 Essential (primary) hypertension: Secondary | ICD-10-CM | POA: Diagnosis not present

## 2018-08-29 NOTE — Patient Outreach (Signed)
[facility]) Care Management  08/29/2018  Laurie Michael 1950-10-16 [id number]  Facility site visit to [facility] to discuss patient progress and transition to home. Facility discharge planner unavailable.  Collaboration with [facility] Team member Jari Pigg who was recently updated on patient's progress.  UM team member stated patient was progressing well after a major abdominal surgery, was alert and oriented and planned to discharge to home with spouse.  Went to see patient at the bedside, spouse at patient's bedside too.  Patient stated she was pleased with her progress.  Patient stated the facility had talked with her about going home on Sunday but she felt ready to go home already and planned to go home with spouse 08/30/18. Introduced Psychologist, forensic and gave patient [facility] packet with contact information. Patient denies transportation needs or medication needs.  Patient accepted [facility] CM services.  Referral placed for [facility] to engage for transition of care. Made [facility] UM Team aware of [facility] CM referral.    Elvina Bosch RN, BSN [place] Acute Care Coordinator [phone]) Business Mobile [phone]) Toll free office

## 2018-09-02 ENCOUNTER — Ambulatory Visit: Payer: PPO | Admitting: Family Medicine

## 2018-09-02 ENCOUNTER — Other Ambulatory Visit: Payer: Self-pay

## 2018-09-02 NOTE — Patient Outreach (Signed)
Transition of care:  4:25pm Incoming call back from husband who reports patient is having a bad day and she is in the bed. Husband reports patient will call back tomorrow.  PLAN: no letter sent. Will await a call back from patient.   Tomasa Rand, RN, BSN, CEN [facility] [employer] [phone]

## 2018-09-02 NOTE — Patient Outreach (Signed)
Transition of care: Discharged from [facility] on 08/30/2018.  Placed call to patient with no answer. Left a message requesting a call back.  PLAN: will mail outreach letter and call again in 3 days.  Tomasa Rand, RN, BSN, CEN [facility] [employer] [phone]

## 2018-09-03 DIAGNOSIS — Z9889 Other specified postprocedural states: Secondary | ICD-10-CM | POA: Insufficient documentation

## 2018-09-03 DIAGNOSIS — Z9049 Acquired absence of other specified parts of digestive tract: Secondary | ICD-10-CM | POA: Insufficient documentation

## 2018-09-05 ENCOUNTER — Other Ambulatory Visit: Payer: Self-pay

## 2018-09-05 NOTE — Patient Outreach (Signed)
Telephone outreach/ new referral:  Placed call to patient who answered. Explained reason for call. Patient reports that she is doing great. Reports she saw her primary Md this week and everything is going well. Patient reports that she declined home health and feels like she does not need [facility].  PLAN: offered to send a letter to patient for future reference and she has agreed.  Reports she is will call if she needs someone in the future. Will close case as patient has declined needs.  Tomasa Rand, RN, BSN, CEN [facility] [employer] [phone]

## 2018-09-06 DIAGNOSIS — K5713 Diverticulitis of small intestine without perforation or abscess with bleeding: Secondary | ICD-10-CM | POA: Diagnosis not present

## 2018-09-06 DIAGNOSIS — E119 Type 2 diabetes mellitus without complications: Secondary | ICD-10-CM | POA: Diagnosis not present

## 2018-09-06 DIAGNOSIS — Z48815 Encounter for surgical aftercare following surgery on the digestive system: Secondary | ICD-10-CM | POA: Diagnosis not present

## 2018-09-26 DIAGNOSIS — M25512 Pain in left shoulder: Secondary | ICD-10-CM | POA: Diagnosis not present

## 2018-11-20 ENCOUNTER — Other Ambulatory Visit: Payer: Self-pay

## 2018-11-20 NOTE — Patient Outreach (Signed)
[facility]) Care Management  11/20/2018  HARVEST DEIST 18-Oct-1950 [id number]   Referral Date:  11/19/2018 Referral Source: Nurseline Referral Reason: Right Shoulder and arm pain x 2 weeks.  Keeps patient up at night.    Outreach Attempt: no answer.  HIPAA compliant voice message left.     Plan: RN CM will attempt patient again within 4 business days and send letter.     Jone Baseman, RN, MSN [facility] Care Management Care Management Coordinator Direct Line [phone] Toll Free: [phone]  Fax: [phone]

## 2018-11-20 NOTE — Patient Outreach (Signed)
[facility]) Care Management  11/20/2018  COURTLYN AKI [birth year] [id number]   Referral Date:  11/19/2018 Referral Source: Nurseline Referral Reason: Right Shoulder and arm pain x 2 weeks.  Keeps patient up at night.   Incoming call from patient.  She is able to verify HIPAA.  She states that she was scheduled for rotator cuff surgery of her left arm but due to the coronavirus it has not been scheduled. Patient states that she prefers to not go out.  However, patient states that now she is having trouble with her right shoulder having pain.  She states she was told to go to the ER but she does not feel she needs to.  Discussed with patient reasons she was told to go but she still prefers not to.  Discussed over utilization of the right due to the left arm/shoulder being out of commission.  She agrees.  Discussed trying to rest right arm/shoulder as much as possible and utilizing heating pad to help with pain. She states she has been in contact with her PCP but could not get an appointment today.  She states they also recommended ER as well but declined.  Discussed use of urgent care if necessary but patient still prefers to manage at home.  Patient states she take aleve and that eases the pain but will try the heating pad as well.  She declined any further needs at this time.    Plan: RN CM will close case.  Jone Baseman, RN, MSN [facility] Management Care Management Coordinator Direct Line [phone] Cell [phone] Toll Free: [phone]  Fax: [phone]

## 2018-11-21 ENCOUNTER — Ambulatory Visit: Payer: Self-pay

## 2018-12-02 MED FILL — ATORVASTATIN 80 MG TABLET: 80 | 90 days supply | Qty: 90 | Fill #0

## 2018-12-03 ENCOUNTER — Other Ambulatory Visit: Payer: Self-pay | Admitting: Internal Medicine

## 2018-12-05 ENCOUNTER — Other Ambulatory Visit: Payer: Self-pay | Admitting: Internal Medicine

## 2018-12-12 DIAGNOSIS — M25512 Pain in left shoulder: Secondary | ICD-10-CM | POA: Diagnosis not present

## 2018-12-27 DIAGNOSIS — M25511 Pain in right shoulder: Secondary | ICD-10-CM | POA: Diagnosis not present

## 2018-12-31 DIAGNOSIS — M25511 Pain in right shoulder: Secondary | ICD-10-CM | POA: Diagnosis not present

## 2018-12-31 DIAGNOSIS — E119 Type 2 diabetes mellitus without complications: Secondary | ICD-10-CM | POA: Diagnosis not present

## 2018-12-31 DIAGNOSIS — M25512 Pain in left shoulder: Secondary | ICD-10-CM | POA: Diagnosis not present

## 2019-01-07 ENCOUNTER — Telehealth: Payer: Self-pay | Admitting: Family Medicine

## 2019-01-07 ENCOUNTER — Encounter: Payer: Self-pay | Admitting: Family Medicine

## 2019-01-07 NOTE — Telephone Encounter (Signed)
Please contact patient to schedule in person appointment for medical surgical clearance for upcoming left shoulder with Dr. French Ana at [facility] office. She will need to come fasting for this appointment.

## 2019-01-07 NOTE — Telephone Encounter (Signed)
Send an unable to contact letter to patient

## 2019-01-07 NOTE — Telephone Encounter (Signed)
Tried contacting patient several times through out the day, left voice message.

## 2019-01-07 NOTE — Telephone Encounter (Signed)
Sent letter on 01/07/2019.

## 2019-01-13 ENCOUNTER — Encounter: Payer: Self-pay | Admitting: Family Medicine

## 2019-01-13 ENCOUNTER — Ambulatory Visit (INDEPENDENT_AMBULATORY_CARE_PROVIDER_SITE_OTHER): Payer: PPO | Admitting: Family Medicine

## 2019-01-13 ENCOUNTER — Other Ambulatory Visit: Payer: Self-pay

## 2019-01-13 VITALS — BP 140/84 | HR 80 | Temp 97.8°F | Ht 63.0 in | Wt 194.2 lb

## 2019-01-13 DIAGNOSIS — Z23 Encounter for immunization: Secondary | ICD-10-CM

## 2019-01-13 DIAGNOSIS — Z01810 Encounter for preprocedural cardiovascular examination: Secondary | ICD-10-CM | POA: Diagnosis not present

## 2019-01-13 DIAGNOSIS — Z13220 Encounter for screening for lipoid disorders: Secondary | ICD-10-CM | POA: Diagnosis not present

## 2019-01-13 DIAGNOSIS — E1169 Type 2 diabetes mellitus with other specified complication: Secondary | ICD-10-CM | POA: Diagnosis not present

## 2019-01-13 DIAGNOSIS — E785 Hyperlipidemia, unspecified: Secondary | ICD-10-CM

## 2019-01-13 DIAGNOSIS — E039 Hypothyroidism, unspecified: Secondary | ICD-10-CM | POA: Diagnosis not present

## 2019-01-13 DIAGNOSIS — Z1389 Encounter for screening for other disorder: Secondary | ICD-10-CM | POA: Diagnosis not present

## 2019-01-13 DIAGNOSIS — Z130 Encounter for screening for diseases of the blood and blood-forming organs and certain disorders involving the immune mechanism: Secondary | ICD-10-CM

## 2019-01-13 DIAGNOSIS — I10 Essential (primary) hypertension: Secondary | ICD-10-CM | POA: Diagnosis not present

## 2019-01-13 DIAGNOSIS — E119 Type 2 diabetes mellitus without complications: Secondary | ICD-10-CM

## 2019-01-13 DIAGNOSIS — Z1329 Encounter for screening for other suspected endocrine disorder: Secondary | ICD-10-CM | POA: Diagnosis not present

## 2019-01-13 MED ORDER — ATORVASTATIN CALCIUM 80 MG PO TABS: 80.0000 mg | ORAL_TABLET | Freq: Every day | ORAL | 3 refills | Status: DC

## 2019-01-13 MED ORDER — METFORMIN HCL 1000 MG PO TABS: 1000.0000 mg | ORAL_TABLET | Freq: Two times a day (BID) | ORAL | 3 refills | Status: DC

## 2019-01-13 MED ORDER — GLIPIZIDE ER 10 MG PO TB24: 10.0000 mg | ORAL_TABLET | Freq: Every day | ORAL | 3 refills | Status: DC

## 2019-01-13 MED ORDER — LISINOPRIL 10 MG PO TABS: 10.0000 mg | ORAL_TABLET | Freq: Every day | ORAL | 3 refills | Status: DC

## 2019-01-13 NOTE — Progress Notes (Signed)
Patient ID: Laurie Michael, female    DOB: November 11, 1950, 68 y.o.   MRN: [id number]  PCP: Scot Jun, FNP  Chief Complaint  Patient presents with  . surgical clearance    Subjective:  HPI  Laurie Michael is a 68 y.o. female presents for medical surgical clearance.  has Essential hypertension; Type 2 diabetes mellitus ([place]); Cataracts, bilateral; Hyperlipidemia; Hyperlipidemia associated with type 2 diabetes mellitus ([place]); Dental calculus; Fatigue; Hypothyroidism; Carpal tunnel syndrome, bilateral; Obesity; Estrogen deficiency; Osteopenia; Post-menopausal bleeding; Polyarthralgia; Cataract extraction status; and History of colon polyps on their problem list.    Surgical clearance for right shoulder scope Diabetes, recent A1C 7.3, improved from 8.8 07/29/2018. Average blood sugar readings at home 120 which is stable. Goal A1C <7.0. She denies hypoglycemia at home. Currently takes metformin and glipizide for diabetes management.  Hypertension traditionally well managed. Prescribed lisinopril. No history of angina or symptoms related to heart failure. Former smoker-quit 12/2012.Today, she asymptomatic of any worrisome symptoms.  Recently s/p segmental colectomy. Surgery due to diverticulosis and resulting in ongoing diverticulitis. Patient tolerated surgery. Reports improvement with digestive health since surgery occurred. Surgery performed at [facility] in [facility] by surgeon Dr. Alphonzo Dublin and in review of discharge note, patient tolerated surgery without intraoperative complications.  Pending surgical clearance, [name] will undergo, left shoulder arthroscopy performed by  Dr. Earlie Server at [place]. Date TBD based on receipt of surgical clearance. Social History   Socioeconomic History  . Marital status: Married    Spouse name: Not on file  . Number of children: Not on file  . Years of education: Not on file  . Highest education level: Not on  file  Occupational History  . Not on file  Social Needs  . Financial resource strain: Not on file  . Food insecurity:    Worry: Not on file    Inability: Not on file  . Transportation needs:    Medical: Not on file    Non-medical: Not on file  Tobacco Use  . Smoking status: Former Smoker    Packs/day: 2.00    Years: 27.00    Pack years: 54.00    Types: Cigarettes    Last attempt to quit: 01/10/2013    Years since quitting: 6.0  . Smokeless tobacco: Never Used  Substance and Sexual Activity  . Alcohol use: No    Alcohol/week: 0.0 standard drinks  . Drug use: No  . Sexual activity: Yes    Birth control/protection: None  Lifestyle  . Physical activity:    Days per week: Not on file    Minutes per session: Not on file  . Stress: Not on file  Relationships  . Social connections:    Talks on phone: Not on file    Gets together: Not on file    Attends religious service: Not on file    Active member of club or organization: Not on file    Attends meetings of clubs or organizations: Not on file    Relationship status: Not on file  . Intimate partner violence:    Fear of current or ex partner: Not on file    Emotionally abused: Not on file    Physically abused: Not on file    Forced sexual activity: Not on file  Other Topics Concern  . Not on file  Social History Narrative  . Not on file    Family History  Problem Relation Age of Onset  . Diabetes  Mother   . Breast cancer Mother   . Heart disease Father   . Cancer Brother   . Breast cancer Sister    Review of Systems  Pertinent negatives listed in HPI  No Known Allergies  Prior to Admission medications   Medication Sig Start Date End Date Taking? Authorizing Provider  Ascorbic Acid (VITAMIN C) 1000 MG tablet Take 1,000 mg by mouth daily.   Yes [provider]  atorvastatin (LIPITOR) 80 MG tablet Take 1 tablet (80 mg total) by mouth daily at 6 PM. 07/29/18 07/29/19 Yes Scot Jun, FNP   calcium-vitamin D (OSCAL WITH D) 500-200 MG-UNIT tablet Take 2 tablets by mouth daily.   Yes [provider]  cyclobenzaprine (FLEXERIL) 10 MG tablet Take 1 tablet (10 mg total) by mouth 2 (two) times daily as needed for muscle spasms. 07/29/18  Yes Scot Jun, FNP  docusate sodium (COLACE) 100 MG capsule Take 1 capsule (100 mg total) by mouth 2 (two) times daily as needed for mild constipation or moderate constipation. 07/17/17  Yes Molt, Bethany, DO  glipiZIDE (GLUCOTROL XL) 10 MG 24 hr tablet Take 1 tablet (10 mg total) by mouth daily. 08/09/18  Yes Scot Jun, FNP  glucose blood (ONE TOUCH ULTRA TEST) test strip Use to check blood sugar once daily. Diagnosis code E11.9 08/20/17  Yes Molt, Bethany, DO  levothyroxine (SYNTHROID, LEVOTHROID) 75 MCG tablet Take 1 tablet (75 mcg total) by mouth daily. 08/09/18 08/09/19 Yes Scot Jun, FNP  lisinopril (PRINIVIL,ZESTRIL) 10 MG tablet Take 1 tablet (10 mg total) by mouth daily. 08/09/18  Yes Scot Jun, FNP  magnesium 30 MG tablet Take 30 mg by mouth 2 (two) times daily.   Yes [provider]  metFORMIN (GLUCOPHAGE) 1000 MG tablet Take 1 tablet (1,000 mg total) by mouth 2 (two) times daily with a meal. 07/29/18 07/29/19 Yes Scot Jun, FNP  traMADol (ULTRAM) 50 MG tablet Take 50 mg by mouth every 6 (six) hours as needed. for pain 12/31/18  Yes [provider]    Past Medical, Surgical Family and Social History reviewed and updated.    Objective:   Today's Vitals   01/13/19 1039  BP: 140/84  Pulse: 80  Temp: 97.8 F (36.6 C)  TempSrc: Oral  SpO2: 93%  Weight: 194 lb 3.2 oz (88.1 kg)  Height: 5\' 3"  (1.6 m)  PainSc: 6   PainLoc: Shoulder    BP Readings from Last 3 Encounters:  01/13/19 140/84  07/29/18 111/77  06/11/18 129/67    Filed Weights   01/13/19 1039  Weight: 194 lb 3.2 oz (88.1 kg)       Physical Exam General appearance: alert, well developed, well  nourished, cooperative and in no distress Head: Normocephalic, without obvious abnormality, atraumatic Respiratory: Respirations even and unlabored, normal respiratory rate Heart: rate and rhythm normal. No gallop or murmurs noted on exam  Abdomen: BS +, no distention, no rebound tenderness, or no mass Extremities: No gross deformities Skin: Skin color, texture, turgor normal. No rashes seen  Psych: Appropriate mood and affect. Neurologic: Mental status: Alert, oriented to person, place, and time, thought content appropriate.  Lab Results  Component Value Date   POCGLU 183 (A) 07/29/2018    Lab Results  Component Value Date   HGBA1C 8.8 (H) 07/29/2018     Assessment & Plan:  1. Essential hypertension, BP elevated, attributing to pain of left shoulder. Completed  EKG 12-Lead, Sinus Rhythm. ECG notes and  old anterior infarct and right-ward P axis rotation, suspect this is related to positioning. Rhythm strip NSR without ST changes.Compared to recent preoperative ECG Normal Sinus Rhythm without any ECG changes,08/09/18. I -Comprehensive metabolic panel  2. Type 2 diabetes mellitus without complication, without long-term current use of insulin ([place]),  Previous A1C 88, improved now 7.3.  Aim for 30 minutes of exercise most days, with a goal of 150 minutes per week. -Glucose monitoring at minimal of twice daily and keep a log of readings. -Commit to medication adherence and self-adjustment as needed -increase foods containing whole grains (one-half of grain intake). -saturated fat intake should be reduced -reduce intake of trans fat (lowers LDL cholesterol and increases HDL cholesterol) -Eat 4-5 small meals during the day to reduce the risk of becoming hungry. Continue metformin and glipizide -hold both prior surgery. - Comprehensive metabolic panel   3. Screening, lipid - Lipid panel  4. Screening for deficiency anemia - CBC with Differential  5. Hypothyroidism, unspecified  type -Continue Levothyroxine  - Thyroid Panel With TSH  6. Screening for blood or protein in urine - POCT URINALYSIS DIP (CLINITEK)  7. Hyperlipidemia associated with type 2 diabetes mellitus (H[facility]) - atorvastatin (LIPITOR) 80 MG tablet; Take 1 tablet (80 mg total) by mouth daily at 6 PM.  Dispense: 90 tablet; Refill: 3  8. Encounter for eye exam in patient with type 2 diabetes mellitus ([place]) - Ambulatory referral to Ophthalmology, overdue for diabetic eye exam    Patient cleared medically and from a cardiac standpoint for surgery, pending lab results. Upon receipt of labs, will fax surgical clar    Molli Barrows, FNP Primary Care at [facility] [address], [place] [phone]: [phone]

## 2019-01-13 NOTE — Progress Notes (Deleted)
Patient ID: Laurie Michael, female    DOB: 05-Apr-1951, 69 y.o.   MRN: [id number]  PCP: Scot Jun, FNP  No chief complaint on file.   Subjective:  HPI  Laurie Michael is a 68 y.o. female presents for evaluation   has Essential hypertension; Type 2 diabetes mellitus ([place]); Cataracts, bilateral; Hyperlipidemia; Hyperlipidemia associated with type 2 diabetes mellitus ([place]); Dental calculus; Fatigue; Hypothyroidism; Carpal tunnel syndrome, bilateral; Obesity; Estrogen deficiency; Osteopenia; Post-menopausal bleeding; Polyarthralgia; Cataract extraction status; and History of colon polyps on their problem list.   Today's visit:  Social History   Socioeconomic History  . Marital status: Married    Spouse name: Not on file  . Number of children: Not on file  . Years of education: Not on file  . Highest education level: Not on file  Occupational History  . Not on file  Social Needs  . Financial resource strain: Not on file  . Food insecurity:    Worry: Not on file    Inability: Not on file  . Transportation needs:    Medical: Not on file    Non-medical: Not on file  Tobacco Use  . Smoking status: Former Smoker    Packs/day: 2.00    Years: 27.00    Pack years: 54.00    Types: Cigarettes    Last attempt to quit: 01/10/2013    Years since quitting: 6.0  . Smokeless tobacco: Never Used  Substance and Sexual Activity  . Alcohol use: No    Alcohol/week: 0.0 standard drinks  . Drug use: No  . Sexual activity: Yes    Birth control/protection: None  Lifestyle  . Physical activity:    Days per week: Not on file    Minutes per session: Not on file  . Stress: Not on file  Relationships  . Social connections:    Talks on phone: Not on file    Gets together: Not on file    Attends religious service: Not on file    Active member of club or organization: Not on file    Attends meetings of clubs or organizations: Not on file    Relationship status: Not on file  .  Intimate partner violence:    Fear of current or ex partner: Not on file    Emotionally abused: Not on file    Physically abused: Not on file    Forced sexual activity: Not on file  Other Topics Concern  . Not on file  Social History Narrative  . Not on file    Family History  Problem Relation Age of Onset  . Diabetes Mother   . Breast cancer Mother   . Heart disease Father   . Cancer Brother   . Breast cancer Sister      Review of Systems  No Known Allergies  Prior to Admission medications   Medication Sig Start Date End Date Taking? Authorizing Provider  Ascorbic Acid (VITAMIN C) 1000 MG tablet Take 1,000 mg by mouth daily.    [provider]  atorvastatin (LIPITOR) 80 MG tablet Take 1 tablet (80 mg total) by mouth daily at 6 PM. 07/29/18 07/29/19  Scot Jun, FNP  calcium-vitamin D (OSCAL WITH D) 500-200 MG-UNIT tablet Take 2 tablets by mouth daily.    [provider]  cyclobenzaprine (FLEXERIL) 10 MG tablet Take 1 tablet (10 mg total) by mouth 2 (two) times daily as needed for muscle spasms. 07/29/18   Scot Jun, FNP  docusate  sodium (COLACE) 100 MG capsule Take 1 capsule (100 mg total) by mouth 2 (two) times daily as needed for mild constipation or moderate constipation. 07/17/17   Molt, Bethany, DO  glipiZIDE (GLUCOTROL XL) 10 MG 24 hr tablet Take 1 tablet (10 mg total) by mouth daily. 08/09/18   Scot Jun, FNP  glucose blood (ONE TOUCH ULTRA TEST) test strip Use to check blood sugar once daily. Diagnosis code E11.9 08/20/17   Molt, Bethany, DO  levothyroxine (SYNTHROID, LEVOTHROID) 75 MCG tablet Take 1 tablet (75 mcg total) by mouth daily. 08/09/18 08/09/19  Scot Jun, FNP  lisinopril (PRINIVIL,ZESTRIL) 10 MG tablet Take 1 tablet (10 mg total) by mouth daily. 08/09/18   Scot Jun, FNP  magnesium 30 MG tablet Take 30 mg by mouth 2 (two) times daily.    [provider]  metFORMIN (GLUCOPHAGE) 1000 MG tablet  Take 1 tablet (1,000 mg total) by mouth 2 (two) times daily with a meal. 07/29/18 07/29/19  Scot Jun, FNP  traMADol (ULTRAM) 50 MG tablet Take 50 mg by mouth every 6 (six) hours as needed. for pain 12/31/18   [provider]    Past Medical, Surgical Family and Social History reviewed and updated.    Objective:  There were no vitals filed for this visit.  BP Readings from Last 3 Encounters:  07/29/18 111/77  06/11/18 129/67  05/08/18 126/80    There were no vitals filed for this visit.     Physical Exam General appearance: alert, well developed, well nourished, cooperative and in no distress Head: Normocephalic, without obvious abnormality, atraumatic Respiratory: Respirations even and unlabored, normal respiratory rate Heart: rate and rhythm normal. No gallop or murmurs noted on exam  Extremities: No gross deformities Skin: Skin color, texture, turgor normal. No rashes seen  Psych: Appropriate mood and affect. Neurologic: Mental status: Alert, oriented to person, place, and time, thought content appropriate.  Lab Results  Component Value Date   POCGLU 183 (A) 07/29/2018    Lab Results  Component Value Date   HGBA1C 8.8 (H) 07/29/2018            Assessment & Plan:  There are no diagnoses linked to this encounter.     Molli Barrows, FNP Primary Care at [facility] [address] [place] [phone]: [phone]

## 2019-01-13 NOTE — Patient Instructions (Signed)
Diabetes Mellitus and Nutrition, Adult  When you have diabetes (diabetes mellitus), it is very important to have healthy eating habits because your blood sugar (glucose) levels are greatly affected by what you eat and drink. Eating healthy foods in the appropriate amounts, at about the same times every day, can help you:  · Control your blood glucose.  · Lower your risk of heart disease.  · Improve your blood pressure.  · Reach or maintain a healthy weight.  Every person with diabetes is different, and each person has different needs for a meal plan. Your health care provider may recommend that you work with a diet and nutrition specialist (dietitian) to make a meal plan that is best for you. Your meal plan may vary depending on factors such as:  · The calories you need.  · The medicines you take.  · Your weight.  · Your blood glucose, blood pressure, and cholesterol levels.  · Your activity level.  · Other health conditions you have, such as heart or kidney disease.  How do carbohydrates affect me?  Carbohydrates, also called carbs, affect your blood glucose level more than any other type of food. Eating carbs naturally raises the amount of glucose in your blood. Carb counting is a method for keeping track of how many carbs you eat. Counting carbs is important to keep your blood glucose at a healthy level, especially if you use insulin or take certain oral diabetes medicines.  It is important to know how many carbs you can safely have in each meal. This is different for every person. Your dietitian can help you calculate how many carbs you should have at each meal and for each snack.  Foods that contain carbs include:  · Bread, cereal, rice, pasta, and crackers.  · Potatoes and corn.  · Peas, beans, and lentils.  · Milk and yogurt.  · Fruit and juice.  · Desserts, such as cakes, cookies, ice cream, and candy.  How does alcohol affect me?  Alcohol can cause a sudden decrease in blood glucose (hypoglycemia),  especially if you use insulin or take certain oral diabetes medicines. Hypoglycemia can be a life-threatening condition. Symptoms of hypoglycemia (sleepiness, dizziness, and confusion) are similar to symptoms of having too much alcohol.  If your health care provider says that alcohol is safe for you, follow these guidelines:  · Limit alcohol intake to no more than 1 drink per day for nonpregnant women and 2 drinks per day for men. One drink equals 12 oz of beer, 5 oz of wine, or 1½ oz of hard liquor.  · Do not drink on an empty stomach.  · Keep yourself hydrated with water, diet soda, or unsweetened iced tea.  · Keep in mind that regular soda, juice, and other mixers may contain a lot of sugar and must be counted as carbs.  What are tips for following this plan?    Reading food labels  · Start by checking the serving size on the "Nutrition Facts" label of packaged foods and drinks. The amount of calories, carbs, fats, and other nutrients listed on the label is based on one serving of the item. Many items contain more than one serving per package.  · Check the total grams (g) of carbs in one serving. You can calculate the number of servings of carbs in one serving by dividing the total carbs by 15. For example, if a food has 30 g of total carbs, it would be equal to 2   servings of carbs.  · Check the number of grams (g) of saturated and trans fats in one serving. Choose foods that have low or no amount of these fats.  · Check the number of milligrams (mg) of salt (sodium) in one serving. Most people should limit total sodium intake to less than 2,300 mg per day.  · Always check the nutrition information of foods labeled as "low-fat" or "nonfat". These foods may be higher in added sugar or refined carbs and should be avoided.  · Talk to your dietitian to identify your daily goals for nutrients listed on the label.  Shopping  · Avoid buying canned, premade, or processed foods. These foods tend to be high in fat, sodium,  and added sugar.  · Shop around the outside edge of the grocery store. This includes fresh fruits and vegetables, bulk grains, fresh meats, and fresh dairy.  Cooking  · Use low-heat cooking methods, such as baking, instead of high-heat cooking methods like deep frying.  · Cook using healthy oils, such as olive, canola, or sunflower oil.  · Avoid cooking with butter, cream, or high-fat meats.  Meal planning  · Eat meals and snacks regularly, preferably at the same times every day. Avoid going long periods of time without eating.  · Eat foods high in fiber, such as fresh fruits, vegetables, beans, and whole grains. Talk to your dietitian about how many servings of carbs you can eat at each meal.  · Eat 4-6 ounces (oz) of lean protein each day, such as lean meat, chicken, fish, eggs, or tofu. One oz of lean protein is equal to:  ? 1 oz of meat, chicken, or fish.  ? 1 egg.  ? ¼ cup of tofu.  · Eat some foods each day that contain healthy fats, such as avocado, nuts, seeds, and fish.  Lifestyle  · Check your blood glucose regularly.  · Exercise regularly as told by your health care provider. This may include:  ? 150 minutes of moderate-intensity or vigorous-intensity exercise each week. This could be brisk walking, biking, or water aerobics.  ? Stretching and doing strength exercises, such as yoga or weightlifting, at least 2 times a week.  · Take medicines as told by your health care provider.  · Do not use any products that contain nicotine or tobacco, such as cigarettes and e-cigarettes. If you need help quitting, ask your health care provider.  · Work with a counselor or diabetes educator to identify strategies to manage stress and any emotional and social challenges.  Questions to ask a health care provider  · Do I need to meet with a diabetes educator?  · Do I need to meet with a dietitian?  · What number can I call if I have questions?  · When are the best times to check my blood glucose?  Where to find more  information:  · American Diabetes Association: diabetes.org  · Academy of Nutrition and Dietetics: www.eatright.org  · National Institute of Diabetes and Digestive and Kidney Diseases (NIH): www.niddk.nih.gov  Summary  · A healthy meal plan will help you control your blood glucose and maintain a healthy lifestyle.  · Working with a diet and nutrition specialist (dietitian) can help you make a meal plan that is best for you.  · Keep in mind that carbohydrates (carbs) and alcohol have immediate effects on your blood glucose levels. It is important to count carbs and to use alcohol carefully.  This information is not intended to   replace advice given to you by your health care provider. Make sure you discuss any questions you have with your health care provider.  Document Released: 04/27/2005 Document Revised: 02/28/2017 Document Reviewed: 09/04/2016  Elsevier Interactive Patient Education © 2019 Elsevier Inc.

## 2019-01-13 NOTE — Progress Notes (Deleted)
Surgical Clearance for her right shoulder  Per pt her sugar is averaging about 120  Per pt she's been taking her Metformin QD instead of BID

## 2019-01-14 ENCOUNTER — Telehealth: Payer: Self-pay | Admitting: Family Medicine

## 2019-01-14 DIAGNOSIS — E870 Hyperosmolality and hypernatremia: Secondary | ICD-10-CM

## 2019-01-14 LAB — COMPREHENSIVE METABOLIC PANEL
ALT: 25 IU/L (ref 0–32)
AST: 23 IU/L (ref 0–40)
Albumin/Globulin Ratio: 1.4 (ref 1.2–2.2)
Albumin: 4 g/dL (ref 3.8–4.8)
Alkaline Phosphatase: 227 IU/L — ABNORMAL HIGH (ref 39–117)
BUN/Creatinine Ratio: 17 (ref 12–28)
BUN: 12 mg/dL (ref 8–27)
Bilirubin Total: 0.2 mg/dL (ref 0.0–1.2)
CO2: 22 mmol/L (ref 20–29)
Calcium: 9.4 mg/dL (ref 8.7–10.3)
Chloride: 106 mmol/L (ref 96–106)
Creatinine, Ser: 0.7 mg/dL (ref 0.57–1.00)
GFR calc Af Amer: 104 mL/min/{1.73_m2} (ref >59)
GFR calc non Af Amer: 91 mL/min/{1.73_m2} (ref >59)
Globulin, Total: 2.8 g/dL (ref 1.5–4.5)
Glucose: 108 mg/dL — ABNORMAL HIGH (ref 65–99)
Potassium: 4.6 mmol/L (ref 3.5–5.2)
Sodium: 145 mmol/L — ABNORMAL HIGH (ref 134–144)
Total Protein: 6.8 g/dL (ref 6.0–8.5)

## 2019-01-14 LAB — CBC WITH DIFFERENTIAL/PLATELET
Basophils Absolute: 0.1 10*3/uL (ref 0.0–0.2)
Basos: 1 %
EOS (ABSOLUTE): 0.3 10*3/uL (ref 0.0–0.4)
Eos: 3 %
Hematocrit: 41.8 % (ref 34.0–46.6)
Hemoglobin: 13.2 g/dL (ref 11.1–15.9)
Immature Grans (Abs): 0 10*3/uL (ref 0.0–0.1)
Immature Granulocytes: 0 %
Lymphocytes Absolute: 3.6 10*3/uL — ABNORMAL HIGH (ref 0.7–3.1)
Lymphs: 33 %
MCH: 28.6 pg (ref 26.6–33.0)
MCHC: 31.6 g/dL (ref 31.5–35.7)
MCV: 91 fL (ref 79–97)
Monocytes Absolute: 0.7 10*3/uL (ref 0.1–0.9)
Monocytes: 7 %
Neutrophils Absolute: 6 10*3/uL (ref 1.4–7.0)
Neutrophils: 56 %
Platelets: 451 10*3/uL — ABNORMAL HIGH (ref 150–450)
RBC: 4.62 x10E6/uL (ref 3.77–5.28)
RDW: 14.5 % (ref 11.7–15.4)
WBC: 10.7 10*3/uL (ref 3.4–10.8)

## 2019-01-14 LAB — LIPID PANEL
Chol/HDL Ratio: 2.7 ratio (ref 0.0–4.4)
Cholesterol, Total: 136 mg/dL (ref 100–199)
HDL: 51 mg/dL (ref >39)
LDL Calculated: 43 mg/dL (ref 0–99)
Triglycerides: 209 mg/dL — ABNORMAL HIGH (ref 0–149)
VLDL Cholesterol Cal: 42 mg/dL — ABNORMAL HIGH (ref 5–40)

## 2019-01-14 LAB — POCT URINALYSIS DIP (CLINITEK)
Bilirubin, UA: NEGATIVE
Blood, UA: NEGATIVE
Glucose, UA: NEGATIVE mg/dL
Ketones, POC UA: NEGATIVE mg/dL
Leukocytes, UA: NEGATIVE
Nitrite, UA: NEGATIVE
POC PROTEIN,UA: NEGATIVE
Spec Grav, UA: 1.025 (ref 1.010–1.025)
Urobilinogen, UA: 0.2 E.U./dL
pH, UA: 7.5 (ref 5.0–8.0)

## 2019-01-14 LAB — THYROID PANEL WITH TSH
Free Thyroxine Index: 2 (ref 1.2–4.9)
T3 Uptake Ratio: 25 % (ref 24–39)
T4, Total: 8 ug/dL (ref 4.5–12.0)
TSH: 2.2 u[IU]/mL (ref 0.450–4.500)

## 2019-01-14 NOTE — Telephone Encounter (Signed)
Please contact patient to advise presurgical labs were within expected range with the exception of her sodium level is elevated. I need for her to hydrate well with water and return in 6/4 for lab appointment to have her sodium level repeated. This level will need to be within normal range in order to have surgery.

## 2019-01-15 NOTE — Telephone Encounter (Signed)
Patient notified of lab results & recommendations. Expressed understanding. Made lab appointment for 01/16/2019 @ 2:15.

## 2019-01-16 ENCOUNTER — Other Ambulatory Visit: Payer: PPO

## 2019-01-16 ENCOUNTER — Other Ambulatory Visit: Payer: Self-pay

## 2019-01-16 DIAGNOSIS — E870 Hyperosmolality and hypernatremia: Secondary | ICD-10-CM | POA: Diagnosis not present

## 2019-01-17 LAB — SODIUM: Sodium: 139 mmol/L (ref 134–144)

## 2019-01-28 ENCOUNTER — Ambulatory Visit: Payer: Self-pay | Admitting: Physician Assistant

## 2019-01-28 ENCOUNTER — Other Ambulatory Visit (HOSPITAL_COMMUNITY)
Admission: RE | Admit: 2019-01-28 | Discharge: 2019-01-28 | Disposition: A | Discharge status: Home / Self Care | Payer: PPO | Source: Ambulatory Visit | Attending: Orthopedic Surgery | Admitting: Orthopedic Surgery

## 2019-01-28 DIAGNOSIS — Z1159 Encounter for screening for other viral diseases: Secondary | ICD-10-CM | POA: Insufficient documentation

## 2019-01-28 NOTE — Progress Notes (Signed)
Appointment scheduled for Covid screen prior to procedure on 6/19.

## 2019-01-28 NOTE — H&P (Signed)
Laurie Michael is an 68 y.o. female.   Chief Complaint: right shoulder pain HPI: Laurie Michael is a 68 year old female. We worked up the left shoulder and found a rotator cuff tear. We elected to hold off on this as she had to have abdominal surgery.  She did go through the abdominal surgery and did well with it. The left shoulder pain did somewhat improve following a cortisone injection. She presented with similar symptoms in the right shoulder, which is her dominant arm. This has been going on for the last several months now. We worked up with MRI showing significant tendinopathy, supraspinatus and possible partial subscapularis. AC arthritis, moderate effusion. No chondral defect.  Past Medical History:  Diagnosis Date  . Complication of anesthesia   . Diabetes mellitus without complication ([place])   . Family history of adverse reaction to anesthesia    mom is slow to wake up  . Hypertension   . Irritable bowel   . Obesity   . PONV (postoperative nausea and vomiting)     Past Surgical History:  Procedure Laterality Date  . CATARACT EXTRACTION, BILATERAL  2017  . COLONOSCOPY N/A 07/12/2016   Procedure: COLONOSCOPY;  Surgeon: Wonda Horner, MD;  Location: [facility] ENDOSCOPY;  Service: Endoscopy;  Laterality: N/A;  . HYSTEROSCOPY W/ ENDOMETRIAL ABLATION    . TUBAL LIGATION    . WISDOM TOOTH EXTRACTION      Family History  Problem Relation Age of Onset  . Diabetes Mother   . Breast cancer Mother   . Heart disease Father   . Cancer Brother   . Breast cancer Sister    Social History:  reports that she quit smoking about 6 years ago. Her smoking use included cigarettes. She has a 54.00 pack-year smoking history. She has never used smokeless tobacco. She reports that she does not drink alcohol or use drugs.  Allergies: No Known Allergies  (Not in a hospital admission)   No results found for this or any previous visit (from the past 48 hour(s)). No results found.  Review of Systems   Gastrointestinal: Positive for abdominal pain.  Musculoskeletal: Positive for joint pain.  All other systems reviewed and are negative.   There were no vitals taken for this visit. Physical Exam  Constitutional: She is oriented to person, place, and time. She appears well-developed and well-nourished. No distress.  HENT:  Head: Normocephalic and atraumatic.  Eyes: Pupils are equal, round, and reactive to light. Conjunctivae and EOM are normal.  Neck: Normal range of motion. Neck supple.  Cardiovascular: Normal rate and intact distal pulses.  Respiratory: Effort normal. No respiratory distress.  GI: Soft. She exhibits no distension.  Musculoskeletal:     Right shoulder: She exhibits decreased range of motion, tenderness, pain and decreased strength.  Lymphadenopathy:    She has no cervical adenopathy.  Neurological: She is alert and oriented to person, place, and time.  Skin: Skin is warm and dry. No rash noted. No erythema.  Psychiatric: She has a normal mood and affect. Her behavior is normal.     Assessment/Plan Right shoulder significant tendinopathy, supraspinatus and possible partial subscapularis. AC arthritis, moderate effusion. No chondral defect.  She is a candidate for a right shoulder debridement, acromioplasty and distal clavicle, less likely repair.  She is  29 and has some co-morbidities. I told her unfortunately at this particular moment we cannot schedule her for surgery due to the [place] pandemic. Hopefully, it will become less restrictive on what we  can do and scheduling will be done. She has a husband with her at home. We will call in Tramadol to help her with her pain.   Chriss Czar, PA-C 01/28/2019, 5:38 PM

## 2019-01-28 NOTE — H&P (Deleted)
  The note originally documented on this encounter has been moved the the encounter in which it belongs.  

## 2019-01-29 ENCOUNTER — Encounter (HOSPITAL_BASED_OUTPATIENT_CLINIC_OR_DEPARTMENT_OTHER): Payer: Self-pay | Admitting: *Deleted

## 2019-01-29 ENCOUNTER — Other Ambulatory Visit: Payer: Self-pay

## 2019-01-29 LAB — NOVEL CORONAVIRUS, NAA (HOSP ORDER, SEND-OUT TO REF LAB; TAT 18-24 HRS): SARS-CoV-2, NAA: NOT DETECTED

## 2019-01-31 ENCOUNTER — Ambulatory Visit (HOSPITAL_BASED_OUTPATIENT_CLINIC_OR_DEPARTMENT_OTHER): Payer: PPO | Admitting: Anesthesiology

## 2019-01-31 ENCOUNTER — Encounter (HOSPITAL_BASED_OUTPATIENT_CLINIC_OR_DEPARTMENT_OTHER): Payer: Self-pay | Admitting: *Deleted

## 2019-01-31 ENCOUNTER — Encounter (HOSPITAL_BASED_OUTPATIENT_CLINIC_OR_DEPARTMENT_OTHER)
Admission: RE | Disposition: A | Discharge status: Home / Self Care | Payer: Self-pay | Source: Home / Self Care | Attending: Orthopedic Surgery

## 2019-01-31 ENCOUNTER — Ambulatory Visit (HOSPITAL_BASED_OUTPATIENT_CLINIC_OR_DEPARTMENT_OTHER)
Admission: RE | Admit: 2019-01-31 | Discharge: 2019-01-31 | Disposition: A | Discharge status: Home / Self Care | Payer: PPO | Attending: Orthopedic Surgery | Admitting: Orthopedic Surgery

## 2019-01-31 DIAGNOSIS — Z6834 Body mass index (BMI) 34.0-34.9, adult: Secondary | ICD-10-CM | POA: Diagnosis not present

## 2019-01-31 DIAGNOSIS — M24111 Other articular cartilage disorders, right shoulder: Secondary | ICD-10-CM | POA: Diagnosis not present

## 2019-01-31 DIAGNOSIS — S43431A Superior glenoid labrum lesion of right shoulder, initial encounter: Secondary | ICD-10-CM | POA: Diagnosis not present

## 2019-01-31 DIAGNOSIS — M19011 Primary osteoarthritis, right shoulder: Secondary | ICD-10-CM | POA: Diagnosis not present

## 2019-01-31 DIAGNOSIS — E039 Hypothyroidism, unspecified: Secondary | ICD-10-CM | POA: Insufficient documentation

## 2019-01-31 DIAGNOSIS — E785 Hyperlipidemia, unspecified: Secondary | ICD-10-CM | POA: Insufficient documentation

## 2019-01-31 DIAGNOSIS — M75101 Unspecified rotator cuff tear or rupture of right shoulder, not specified as traumatic: Secondary | ICD-10-CM | POA: Diagnosis not present

## 2019-01-31 DIAGNOSIS — Z79890 Hormone replacement therapy (postmenopausal): Secondary | ICD-10-CM | POA: Diagnosis not present

## 2019-01-31 DIAGNOSIS — Z87891 Personal history of nicotine dependence: Secondary | ICD-10-CM | POA: Diagnosis not present

## 2019-01-31 DIAGNOSIS — Z7984 Long term (current) use of oral hypoglycemic drugs: Secondary | ICD-10-CM | POA: Insufficient documentation

## 2019-01-31 DIAGNOSIS — M7541 Impingement syndrome of right shoulder: Secondary | ICD-10-CM | POA: Diagnosis not present

## 2019-01-31 DIAGNOSIS — X58XXXA Exposure to other specified factors, initial encounter: Secondary | ICD-10-CM | POA: Diagnosis not present

## 2019-01-31 DIAGNOSIS — Z9049 Acquired absence of other specified parts of digestive tract: Secondary | ICD-10-CM | POA: Insufficient documentation

## 2019-01-31 DIAGNOSIS — S46111A Strain of muscle, fascia and tendon of long head of biceps, right arm, initial encounter: Secondary | ICD-10-CM | POA: Insufficient documentation

## 2019-01-31 DIAGNOSIS — M7551 Bursitis of right shoulder: Secondary | ICD-10-CM | POA: Diagnosis not present

## 2019-01-31 DIAGNOSIS — M25811 Other specified joint disorders, right shoulder: Secondary | ICD-10-CM | POA: Diagnosis not present

## 2019-01-31 DIAGNOSIS — E1169 Type 2 diabetes mellitus with other specified complication: Secondary | ICD-10-CM | POA: Diagnosis not present

## 2019-01-31 DIAGNOSIS — G8918 Other acute postprocedural pain: Secondary | ICD-10-CM | POA: Diagnosis not present

## 2019-01-31 DIAGNOSIS — M65811 Other synovitis and tenosynovitis, right shoulder: Secondary | ICD-10-CM | POA: Diagnosis not present

## 2019-01-31 DIAGNOSIS — I10 Essential (primary) hypertension: Secondary | ICD-10-CM | POA: Diagnosis not present

## 2019-01-31 HISTORY — DX: Hypothyroidism, unspecified: E03.9

## 2019-01-31 HISTORY — PX: SHOULDER ARTHROSCOPY WITH DISTAL CLAVICLE RESECTION: SHX5675

## 2019-01-31 HISTORY — PX: SHOULDER ARTHROSCOPY WITH SUBACROMIAL DECOMPRESSION: SHX5684

## 2019-01-31 LAB — GLUCOSE, CAPILLARY
Glucose-Capillary: 107 mg/dL — ABNORMAL HIGH (ref 70–99)
Glucose-Capillary: 164 mg/dL — ABNORMAL HIGH (ref 70–99)

## 2019-01-31 SURGERY — SHOULDER ARTHROSCOPY WITH SUBACROMIAL DECOMPRESSION
Anesthesia: General | Site: Shoulder | Laterality: Right

## 2019-01-31 MED ORDER — ACETAMINOPHEN 325 MG PO TABS: 325.0000 mg | ORAL_TABLET | ORAL | Status: DC | PRN

## 2019-01-31 MED ORDER — ROCURONIUM BROMIDE 100 MG/10ML IV SOLN
INTRAVENOUS | Status: DC | PRN
  Administered 2019-01-31: 50 mg via INTRAVENOUS

## 2019-01-31 MED ORDER — MIDAZOLAM HCL 2 MG/2ML IJ SOLN
INTRAMUSCULAR | Status: AC
  Filled 2019-01-31: qty 2

## 2019-01-31 MED ORDER — LIDOCAINE 2% (20 MG/ML) 5 ML SYRINGE
INTRAMUSCULAR | Status: DC | PRN
  Administered 2019-01-31: 80 mg via INTRAVENOUS

## 2019-01-31 MED ORDER — LIDOCAINE 2% (20 MG/ML) 5 ML SYRINGE
INTRAMUSCULAR | Status: AC
  Filled 2019-01-31: qty 5

## 2019-01-31 MED ORDER — SUCCINYLCHOLINE CHLORIDE 200 MG/10ML IV SOSY
PREFILLED_SYRINGE | INTRAVENOUS | Status: DC | PRN
  Administered 2019-01-31: 120 mg via INTRAVENOUS

## 2019-01-31 MED ORDER — PHENYLEPHRINE 40 MCG/ML (10ML) SYRINGE FOR IV PUSH (FOR BLOOD PRESSURE SUPPORT)
PREFILLED_SYRINGE | INTRAVENOUS | Status: DC | PRN
  Administered 2019-01-31 (×2): 120 ug via INTRAVENOUS

## 2019-01-31 MED ORDER — OXYCODONE HCL 5 MG PO TABS: 5.0000 mg | ORAL_TABLET | Freq: Once | ORAL | Status: DC | PRN

## 2019-01-31 MED ORDER — FENTANYL CITRATE (PF) 100 MCG/2ML IJ SOLN
INTRAMUSCULAR | Status: AC
  Filled 2019-01-31: qty 2

## 2019-01-31 MED ORDER — SUGAMMADEX SODIUM 200 MG/2ML IV SOLN
INTRAVENOUS | Status: DC | PRN
  Administered 2019-01-31: 400 mg via INTRAVENOUS

## 2019-01-31 MED ORDER — FENTANYL CITRATE (PF) 100 MCG/2ML IJ SOLN: 25.0000 ug | INTRAMUSCULAR | Status: DC | PRN

## 2019-01-31 MED ORDER — BUPIVACAINE LIPOSOME 1.3 % IJ SUSP
INTRAMUSCULAR | Status: DC | PRN
  Administered 2019-01-31: 10 mL via PERINEURAL

## 2019-01-31 MED ORDER — MIDAZOLAM HCL 2 MG/2ML IJ SOLN
1.0000 mg | INTRAMUSCULAR | Status: DC | PRN
  Administered 2019-01-31: 10:00:00 2 mg via INTRAVENOUS

## 2019-01-31 MED ORDER — PHENYLEPHRINE HCL (PRESSORS) 10 MG/ML IV SOLN
INTRAVENOUS | Status: AC
  Filled 2019-01-31: qty 1

## 2019-01-31 MED ORDER — CHLORHEXIDINE GLUCONATE 4 % EX LIQD: 60.0000 mL | Freq: Once | CUTANEOUS | Status: DC

## 2019-01-31 MED ORDER — OXYCODONE HCL 5 MG PO TABS: 5.0000 mg | ORAL_TABLET | ORAL | 0 refills | Status: DC | PRN

## 2019-01-31 MED ORDER — OXYCODONE HCL 5 MG/5ML PO SOLN: 5.0000 mg | Freq: Once | ORAL | Status: DC | PRN

## 2019-01-31 MED ORDER — CEFAZOLIN SODIUM-DEXTROSE 2-4 GM/100ML-% IV SOLN
INTRAVENOUS | Status: AC
  Filled 2019-01-31: qty 100

## 2019-01-31 MED ORDER — LACTATED RINGERS IV SOLN
INTRAVENOUS | Status: DC
  Administered 2019-01-31: 10:00:00 via INTRAVENOUS

## 2019-01-31 MED ORDER — PHENYLEPHRINE 40 MCG/ML (10ML) SYRINGE FOR IV PUSH (FOR BLOOD PRESSURE SUPPORT)
PREFILLED_SYRINGE | INTRAVENOUS | Status: AC
  Filled 2019-01-31: qty 10

## 2019-01-31 MED ORDER — ONDANSETRON HCL 4 MG/2ML IJ SOLN
INTRAMUSCULAR | Status: DC | PRN
  Administered 2019-01-31 (×2): 4 mg via INTRAVENOUS

## 2019-01-31 MED ORDER — EPHEDRINE 5 MG/ML INJ
INTRAVENOUS | Status: AC
  Filled 2019-01-31: qty 10

## 2019-01-31 MED ORDER — SODIUM CHLORIDE 0.9 % IV SOLN: INTRAVENOUS | Status: DC

## 2019-01-31 MED ORDER — TIZANIDINE HCL 2 MG PO CAPS: 2.0000 mg | ORAL_CAPSULE | Freq: Three times a day (TID) | ORAL | 0 refills | Status: DC | PRN

## 2019-01-31 MED ORDER — SCOPOLAMINE 1 MG/3DAYS TD PT72
MEDICATED_PATCH | TRANSDERMAL | Status: AC
  Filled 2019-01-31: qty 1

## 2019-01-31 MED ORDER — ACETAMINOPHEN 160 MG/5ML PO SOLN: 325.0000 mg | ORAL | Status: DC | PRN

## 2019-01-31 MED ORDER — EPHEDRINE SULFATE-NACL 50-0.9 MG/10ML-% IV SOSY
PREFILLED_SYRINGE | INTRAVENOUS | Status: DC | PRN
  Administered 2019-01-31: 10 mg via INTRAVENOUS
  Administered 2019-01-31: 5 mg via INTRAVENOUS

## 2019-01-31 MED ORDER — MEPERIDINE HCL 25 MG/ML IJ SOLN: 6.2500 mg | INTRAMUSCULAR | Status: DC | PRN

## 2019-01-31 MED ORDER — ONDANSETRON HCL 4 MG/2ML IJ SOLN: 4.0000 mg | Freq: Once | INTRAMUSCULAR | Status: DC | PRN

## 2019-01-31 MED ORDER — SCOPOLAMINE 1 MG/3DAYS TD PT72
1.0000 | MEDICATED_PATCH | Freq: Once | TRANSDERMAL | Status: DC
  Administered 2019-01-31: 1.5 mg via TRANSDERMAL

## 2019-01-31 MED ORDER — ROCURONIUM BROMIDE 10 MG/ML (PF) SYRINGE
PREFILLED_SYRINGE | INTRAVENOUS | Status: AC
  Filled 2019-01-31: qty 10

## 2019-01-31 MED ORDER — BUPIVACAINE-EPINEPHRINE (PF) 0.5% -1:200000 IJ SOLN
INTRAMUSCULAR | Status: DC | PRN
  Administered 2019-01-31: 20 mL via PERINEURAL

## 2019-01-31 MED ORDER — PROPOFOL 10 MG/ML IV BOLUS
INTRAVENOUS | Status: DC | PRN
  Administered 2019-01-31: 150 mg via INTRAVENOUS

## 2019-01-31 MED ORDER — SODIUM CHLORIDE 0.9 % IV SOLN
INTRAVENOUS | Status: DC | PRN
  Administered 2019-01-31: 60 ug/min via INTRAVENOUS

## 2019-01-31 MED ORDER — FENTANYL CITRATE (PF) 100 MCG/2ML IJ SOLN
50.0000 ug | INTRAMUSCULAR | Status: DC | PRN
  Administered 2019-01-31 (×2): 50 ug via INTRAVENOUS

## 2019-01-31 MED ORDER — CEFAZOLIN SODIUM-DEXTROSE 2-4 GM/100ML-% IV SOLN
2.0000 g | INTRAVENOUS | Status: AC
  Administered 2019-01-31: 10:00:00 2 g via INTRAVENOUS

## 2019-01-31 MED ORDER — DEXAMETHASONE SODIUM PHOSPHATE 10 MG/ML IJ SOLN
INTRAMUSCULAR | Status: DC | PRN
  Administered 2019-01-31: 5 mg via INTRAVENOUS

## 2019-01-31 SURGICAL SUPPLY — 87 items
AID PSTN UNV HD RSTRNT DISP (MISCELLANEOUS) ×2
APL SKNCLS STERI-STRIP NONHPOA (GAUZE/BANDAGES/DRESSINGS)
BENZOIN TINCTURE PRP APPL 2/3 (GAUZE/BANDAGES/DRESSINGS) IMPLANT
BLADE AVERAGE 25MMX9MM (BLADE)
BLADE AVERAGE 25X9 (BLADE) IMPLANT
BLADE SURG 15 STRL LF DISP TIS (BLADE) IMPLANT
BLADE SURG 15 STRL SS (BLADE)
BLADE VORTEX 6.0 (BLADE) IMPLANT
BUR 3.5 LG SPHERICAL (BURR) IMPLANT
BUR EGG 3PK/BX (BURR) IMPLANT
BUR OVAL 4.0 (BURR) IMPLANT
BUR VERTEX HOODED 4.5 (BURR) IMPLANT
BURR 3.5 LG SPHERICAL (BURR)
BURR 3.5MM LG SPHERICAL (BURR)
BURR OVAL 8 FLU 5.0MM X 13CM (MISCELLANEOUS) ×1
BURR OVAL 8 FLU 5.0X13 (MISCELLANEOUS) ×3 IMPLANT
CANNULA SHOULDER 7CM (CANNULA) ×4 IMPLANT
CANNULA TWIST IN 8.25X7CM (CANNULA) IMPLANT
CLEANER CAUTERY TIP 5X5 PAD (MISCELLANEOUS) IMPLANT
CLOSURE WOUND 1/2 X4 (GAUZE/BANDAGES/DRESSINGS)
COVER WAND RF STERILE (DRAPES) IMPLANT
DECANTER SPIKE VIAL GLASS SM (MISCELLANEOUS) IMPLANT
DISSECTOR  3.8MM X 13CM (MISCELLANEOUS)
DISSECTOR 3.8MM X 13CM (MISCELLANEOUS) IMPLANT
DISSECTOR 4.0MM X 13CM (MISCELLANEOUS) ×3 IMPLANT
DRAPE STERI 35X30 U-POUCH (DRAPES) ×4 IMPLANT
DRAPE SURG 17X23 STRL (DRAPES) ×4 IMPLANT
DRAPE U-SHAPE 76X120 STRL (DRAPES) ×8 IMPLANT
DRSG EMULSION OIL 3X3 NADH (GAUZE/BANDAGES/DRESSINGS) ×4 IMPLANT
DRSG PAD ABDOMINAL 8X10 ST (GAUZE/BANDAGES/DRESSINGS) ×4 IMPLANT
DURAPREP 26ML APPLICATOR (WOUND CARE) ×4 IMPLANT
ELECT REM PT RETURN 9FT ADLT (ELECTROSURGICAL) ×4
ELECTRODE REM PT RTRN 9FT ADLT (ELECTROSURGICAL) ×2 IMPLANT
GAUZE SPONGE 4X4 12PLY STRL (GAUZE/BANDAGES/DRESSINGS) ×4 IMPLANT
GLOVE BIO SURGEON STRL SZ 6.5 (GLOVE) ×2 IMPLANT
GLOVE BIO SURGEON STRL SZ7.5 (GLOVE) ×1 IMPLANT
GLOVE BIO SURGEONS STRL SZ 6.5 (GLOVE) ×1
GLOVE BIOGEL PI IND STRL 7.0 (GLOVE) ×2 IMPLANT
GLOVE BIOGEL PI IND STRL 8 (GLOVE) ×3 IMPLANT
GLOVE BIOGEL PI INDICATOR 7.0 (GLOVE) ×4
GLOVE BIOGEL PI INDICATOR 8 (GLOVE) ×2
GLOVE SURG ORTHO 8.0 STRL STRW (GLOVE) ×4 IMPLANT
GOWN STRL REUS W/ TWL LRG LVL3 (GOWN DISPOSABLE) ×2 IMPLANT
GOWN STRL REUS W/ TWL XL LVL3 (GOWN DISPOSABLE) ×2 IMPLANT
GOWN STRL REUS W/TWL LRG LVL3 (GOWN DISPOSABLE) ×4
GOWN STRL REUS W/TWL XL LVL3 (GOWN DISPOSABLE) ×8 IMPLANT
MANIFOLD NEPTUNE II (INSTRUMENTS) ×4 IMPLANT
NDL 1/2 CIR CATGUT .05X1.09 (NEEDLE) IMPLANT
NDL SCORPION MULTI FIRE (NEEDLE) IMPLANT
NEEDLE 1/2 CIR CATGUT .05X1.09 (NEEDLE) IMPLANT
NEEDLE SCORPION MULTI FIRE (NEEDLE) IMPLANT
NS IRRIG 1000ML POUR BTL (IV SOLUTION) ×4 IMPLANT
PACK ARTHROSCOPY DSU (CUSTOM PROCEDURE TRAY) ×4 IMPLANT
PACK BASIN DAY SURGERY FS (CUSTOM PROCEDURE TRAY) ×4 IMPLANT
PAD CLEANER CAUTERY TIP 5X5 (MISCELLANEOUS)
PAD ORTHO SHOULDER 7X19 LRG (SOFTGOODS) IMPLANT
PENCIL BUTTON HOLSTER BLD 10FT (ELECTRODE) IMPLANT
PORT APPOLLO RF 90DEGREE MULTI (SURGICAL WAND) ×3 IMPLANT
RESTRAINT HEAD UNIVERSAL NS (MISCELLANEOUS) ×4 IMPLANT
SLEEVE SCD COMPRESS KNEE MED (MISCELLANEOUS) ×4 IMPLANT
SLING ARM FOAM STRAP LRG (SOFTGOODS) IMPLANT
SLING ARM MED ADULT FOAM STRAP (SOFTGOODS) IMPLANT
SLING ULTRA II MEDIUM (SOFTGOODS) IMPLANT
SLING ULTRA II SMALL (SOFTGOODS) IMPLANT
SPONGE LAP 4X18 RFD (DISPOSABLE) IMPLANT
STAPLER VISISTAT 35W (STAPLE) IMPLANT
STRIP CLOSURE SKIN 1/2X4 (GAUZE/BANDAGES/DRESSINGS) IMPLANT
SUCTION FRAZIER HANDLE 10FR (MISCELLANEOUS)
SUCTION TUBE FRAZIER 10FR DISP (MISCELLANEOUS) IMPLANT
SUT BONE WAX W31G (SUTURE) IMPLANT
SUT ETHILON 3 0 PS 1 (SUTURE) ×4 IMPLANT
SUT FIBERWIRE #2 38 T-5 BLUE (SUTURE)
SUT MNCRL AB 3-0 PS2 18 (SUTURE) IMPLANT
SUT TICRON 1 T 12 (SUTURE) IMPLANT
SUT TIGER TAPE 7 IN WHITE (SUTURE) IMPLANT
SUT VIC AB 0 CT1 27 (SUTURE)
SUT VIC AB 0 CT1 27XBRD ANBCTR (SUTURE) IMPLANT
SUT VIC AB 1 CT1 27 (SUTURE)
SUT VIC AB 1 CT1 27XBRD ANBCTR (SUTURE) IMPLANT
SUT VIC AB 2-0 SH 27 (SUTURE)
SUT VIC AB 2-0 SH 27XBRD (SUTURE) IMPLANT
SUTURE FIBERWR #2 38 T-5 BLUE (SUTURE) IMPLANT
TAPE FIBER 2MM 7IN #2 BLUE (SUTURE) IMPLANT
TOWEL GREEN STERILE FF (TOWEL DISPOSABLE) ×4 IMPLANT
TUBING ARTHROSCOPY IRRIG 16FT (MISCELLANEOUS) ×4 IMPLANT
WATER STERILE IRR 1000ML POUR (IV SOLUTION) ×4 IMPLANT
YANKAUER SUCT BULB TIP NO VENT (SUCTIONS) IMPLANT

## 2019-01-31 NOTE — Anesthesia Procedure Notes (Signed)
Procedure Name: Intubation Date/Time: 01/31/2019 10:16 AM Performed by: Gwyndolyn Saxon, CRNA Pre-anesthesia Checklist: Patient identified, Emergency Drugs available, Suction available and Patient being monitored Patient Re-evaluated:Patient Re-evaluated prior to induction Oxygen Delivery Method: Circle system utilized Preoxygenation: Pre-oxygenation with 100% oxygen Induction Type: IV induction and Rapid sequence Ventilation: Mask ventilation without difficulty Laryngoscope Size: Miller and 2 Grade View: Grade II Tube type: Oral Tube size: 7.0 mm Number of attempts: 1 Airway Equipment and Method: Patient positioned with wedge pillow and Stylet Placement Confirmation: ETT inserted through vocal cords under direct vision,  positive ETCO2 and breath sounds checked- equal and bilateral Secured at: 21 cm Tube secured with: Tape Dental Injury: Teeth and Oropharynx as per pre-operative assessment

## 2019-01-31 NOTE — Anesthesia Preprocedure Evaluation (Signed)
Anesthesia Evaluation  Patient identified by MRN, date of birth, ID band Patient awake    Reviewed: Allergy & Precautions, NPO status , Patient's Chart, lab work & pertinent test results  History of Anesthesia Complications (+) PONV and history of anesthetic complications  Airway Mallampati: II  TM Distance: >3 FB Neck ROM: Full    Dental no notable dental hx. (+) Dental Advisory Given   Pulmonary former smoker,    Pulmonary exam normal        Cardiovascular hypertension, Pt. on medications Normal cardiovascular exam     Neuro/Psych negative neurological ROS     GI/Hepatic negative GI ROS, Neg liver ROS,   Endo/Other  diabetes, Type 2Hypothyroidism Morbid obesity  Renal/GU negative Renal ROS     Musculoskeletal negative musculoskeletal ROS (+)   Abdominal   Peds  Hematology negative hematology ROS (+)   Anesthesia Other Findings Day of surgery medications reviewed with the patient.  Reproductive/Obstetrics                            Anesthesia Physical  Anesthesia Plan  ASA: III  Anesthesia Plan: General   Post-op Pain Management: GA combined w/ Regional for post-op pain   Induction:   PONV Risk Score and Plan: 4 or greater and Ondansetron, Treatment may vary due to age or medical condition, Scopolamine patch - Pre-op, Midazolam and Propofol infusion  Airway Management Planned: Oral ETT and LMA  Additional Equipment:   Intra-op Plan:   Post-operative Plan: Extubation in OR  Informed Consent: I have reviewed the patients History and Physical, chart, labs and discussed the procedure including the risks, benefits and alternatives for the proposed anesthesia with the patient or authorized representative who has indicated his/her understanding and acceptance.     Dental advisory given  Plan Discussed with: CRNA, Anesthesiologist and Surgeon  Anesthesia Plan Comments:  (Discussed both nerve block for pain relief post-op and GA; including NV, sore throat, dental injury, and pulmonary complications)        Anesthesia Quick Evaluation

## 2019-01-31 NOTE — Progress Notes (Signed)
Assisted Dr. Ambrose Pancoast with right, ultrasound guided, interscalene  block. Side rails up, monitors on throughout procedure. See vital signs in flow sheet. Tolerated Procedure well. 10cc Exparel given at time of block.

## 2019-01-31 NOTE — Op Note (Signed)
NAME: Laurie Michael, Laurie Michael MEDICAL RECORD [id number] ACCOUNT [id number] DATE OF BIRTH:[birth year] FACILITY: [facility] LOCATION: [facility] PHYSICIAN:W. Kairen Hallinan JR., MD  OPERATIVE REPORT  DATE OF PROCEDURE:  01/31/2019  PREOPERATIVE DIAGNOSES:   1.  Diffuse rotator cuff tear tendinopathy.   2.  Impingement.   3.  AC joint arthritis. 4. Degenerative tearing anterior superior and inferior labrum.  POSTOPERATIVE DIAGNOSES:   1.  Diffuse rotator cuff tear tendinopathy.   2.  Impingement.   3.  AC joint arthritis. 4. Degenerative tearing anterior superior and inferior labrum.  OPERATION:   1.  Arthroscopic debridement, extensive. 2.  Arthroscopic acromioplasty.   3.  Arthroscopic distal clavicle off of the right shoulder.  DESCRIPTION OF PROCEDURE:  Arthroscopic portals created posterolaterally and anteriorly.  Systematic inspection of the shoulder showed the patient to have a diffuse synovitis in the shoulder without a lot of glenohumeral wear.  Biceps tendon showed a  chronic rupture with a stump of tendon impinging into the joint, which was debrided with severe tendinopathy of all, rotator cuff tendons were noted with a partial tear of the subscap.  This was not something amenable to repair.  The cuff tissue,  particularly the supraspinatus, was diffusely diseased with severe tendinopathy, but no full thickness tear was noted.  Aggressive synovectomy, debridement of the tendon edges as well as torn labral edges were carried out as well the stump of the biceps.  Attention was next directed to the subacromial space was extremely hypertrophied and inflamed.  A bursectomy carried out.  Significant impingement from the leading edge of the acromion CA ligament was noted.  CA ligament was released.  Acromioplasty  carried out.  Distal clavicle was noted to be severely arthritic and AC joint resection was carried out as well.  There was no full thickness component appreciated on the superior  surface.  Complete bursectomy carried out.  Shoulder was drained of fluid.   Portals were closed with nylon.    A lightly compressive sterile dressing and sling applied.  Taken to recovery room in stable condition.  AN/NUANCE  D:01/31/2019 T:01/31/2019 JOB:[id number]

## 2019-01-31 NOTE — Discharge Instructions (Signed)
Post Anesthesia Home Care Instructions  Activity: Get plenty of rest for the remainder of the day. A responsible individual must stay with you for 24 hours following the procedure.  For the next 24 hours, DO NOT: -Drive a car -Paediatric nurse -Drink alcoholic beverages -Take any medication unless instructed by your physician -Make any legal decisions or sign important papers.  Meals: Start with liquid foods such as gelatin or soup. Progress to regular foods as tolerated. Avoid greasy, spicy, heavy foods. If nausea and/or vomiting occur, drink only clear liquids until the nausea and/or vomiting subsides. Call your physician if vomiting continues.  Special Instructions/Symptoms: Your throat may feel dry or sore from the anesthesia or the breathing tube placed in your throat during surgery. If this causes discomfort, gargle with warm salt water. The discomfort should disappear within 24 hours.  If you had a scopolamine patch placed behind your ear for the management of post- operative nausea and/or vomiting:  1. The medication in the patch is effective for 72 hours, after which it should be removed.  Wrap patch in a tissue and discard in the trash. Wash hands thoroughly with soap and water. 2. You may remove the patch earlier than 72 hours if you experience unpleasant side effects which may include dry mouth, dizziness or visual disturbances. 3. Avoid touching the patch. Wash your hands with soap and water after contact with the patch.    Regional Anesthesia Blocks  1. Numbness or the inability to move the "blocked" extremity may last from 3-48 hours after placement. The length of time depends on the medication injected and your individual response to the medication. If the numbness is not going away after 48 hours, call your surgeon.  2. The extremity that is blocked will need to be protected until the numbness is gone and the  Strength has returned. Because you cannot feel it, you will  need to take extra care to avoid injury. Because it may be weak, you may have difficulty moving it or using it. You may not know what position it is in without looking at it while the block is in effect.  3. For blocks in the legs and feet, returning to weight bearing and walking needs to be done carefully. You will need to wait until the numbness is entirely gone and the strength has returned. You should be able to move your leg and foot normally before you try and bear weight or walk. You will need someone to be with you when you first try to ensure you do not fall and possibly risk injury.  4. Bruising and tenderness at the needle site are common side effects and will resolve in a few days.  5. Persistent numbness or new problems with movement should be communicated to the surgeon or the [place] [phone] [place] [phone]).     Bupivacaine Liposomal Suspension for Injection What is this medicine? BUPIVACAINE LIPOSOMAL (bue PIV a kane LIP oh som al) is an anesthetic. It causes loss of feeling in the skin or other tissues. It is used to prevent and to treat pain from some procedures. This medicine may be used for other purposes; ask your health care provider or pharmacist if you have questions. COMMON BRAND NAME(S): EXPAREL What should I tell my health care provider before I take this medicine? They need to know if you have any of these conditions: -heart disease -kidney disease -liver disease -an unusual or allergic reaction to bupivacaine,  other medicines, foods, dyes, or preservatives -pregnant or trying to get pregnant -breast-feeding How should I use this medicine? This medicine is for injection into the affected area. It is given by a health care professional in a hospital or clinic setting. Talk to your pediatrician regarding the use of this medicine in children. Special care may be needed. Overdosage: If you think you have taken too much  of this medicine contact a poison control center or emergency room at once. NOTE: This medicine is only for you. Do not share this medicine with others. What if I miss a dose? This does not apply. What may interact with this medicine? This medicine may interact with the following medications: -acetaminophen -certain antibiotics like dapsone, nitrofurantoin, aminosalicylic acid, sulfasalazine -certain medicines for seizures like phenobarbital, phenytoin, valproic acid -chloroquine -cyclophosphamide -flutamide -hydroxyurea -ifosfamide -metoclopramide -nitroglycerin -other local anesthetics like lidocaine, pramoxine, tetracaine -primaquine -quinine This list may not describe all possible interactions. Give your health care provider a list of all the medicines, herbs, non-prescription drugs, or dietary supplements you use. Also tell them if you smoke, drink alcohol, or use illegal drugs. Some items may interact with your medicine. What should I watch for while using this medicine? Your condition will be monitored carefully while you are receiving this medicine. Be careful to avoid injury while the area is numb and you are not aware of pain. What side effects may I notice from receiving this medicine? Side effects that you should report to your doctor or health care professional as soon as possible: -allergic reactions like skin rash, itching or hives, swelling of the face, lips, or tongue -breathing problems -changes in vision -dizziness -fast or slow, irregular heartbeat -joint pain, stiffness, or loss of motion -seizures Side effects that usually do not require medical attention (report to your doctor or health care professional if they continue or are bothersome): -constipation -irritation at site where injected -nausea, vomiting -tiredness This list may not describe all possible side effects. Call your doctor for medical advice about side effects. You may report side effects to  FDA at 1-800-FDA-1088. Where should I keep my medicine? This drug is given in a hospital or clinic and will not be stored at home. NOTE: This sheet is a summary. It may not cover all possible information. If you have questions about this medicine, talk to your doctor, pharmacist, or health care provider.  2019 Elsevier/Gold Standard (2017-07-23 10:34:09)

## 2019-01-31 NOTE — Transfer of Care (Signed)
Immediate Anesthesia Transfer of Care Note  Patient: Laurie Michael  Procedure(s) Performed: SHOULDER ARTHROSCOPY WITH SUBACROMIAL DECOMPRESSION (Right Shoulder) SHOULDER ARTHROSCOPY WITH DISTAL CLAVICLE RESECTION (Right Shoulder)  Patient Location: PACU  Anesthesia Type:General and Regional  Level of Consciousness: drowsy and patient cooperative  Airway & Oxygen Therapy: Patient Spontanous Breathing and Patient connected to face mask oxygen  Post-op Assessment: Report given to RN and Post -op Vital signs reviewed and stable  Post vital signs: Reviewed and stable  Last Vitals:  Vitals Value Taken Time  BP 123/71 01/31/19 1125  Temp    Pulse 97 01/31/19 1129  Resp 17 01/31/19 1129  SpO2 99 % 01/31/19 1129  Vitals shown include unvalidated device data.  Last Pain:  Vitals:   01/31/19 0905  TempSrc: Oral  PainSc: 3          Complications: No apparent anesthesia complications

## 2019-01-31 NOTE — Anesthesia Procedure Notes (Signed)
Anesthesia Regional Block: Interscalene brachial plexus block   Pre-Anesthetic Checklist: ,, timeout performed, Correct Patient, Correct Site, Correct Laterality, Correct Procedure, Correct Position, site marked, Risks and benefits discussed,  Surgical consent,  Pre-op evaluation,  At surgeon's request and post-op pain management  Laterality: Right  Prep: chloraprep       Needles:  Injection technique: Single-shot  Needle Type: Echogenic Stimulator Needle     Needle Length: 5cm  Needle Gauge: 22     Additional Needles:   Procedures:, nerve stimulator,,, ultrasound used (permanent image in chart),,,,   Nerve Stimulator or Paresthesia:  Response: hand, 0.45 mA,   Additional Responses:   Narrative:  Start time: 01/31/2019 9:48 AM End time: 01/31/2019 9:55 AM Injection made incrementally with aspirations every 5 mL.  Performed by: Personally  Anesthesiologist: Janeece Riggers, MD  Additional Notes: Functioning IV was confirmed and monitors were applied.  A 7mm 22ga Arrow echogenic stimulator needle was used. Sterile prep and drape,hand hygiene and sterile gloves were used. Ultrasound guidance: relevant anatomy identified, needle position confirmed, local anesthetic spread visualized around nerve(s)., vascular puncture avoided.  Image printed for medical record. Negative aspiration and negative test dose prior to incremental administration of local anesthetic. The patient tolerated the procedure well.

## 2019-02-03 ENCOUNTER — Encounter (HOSPITAL_BASED_OUTPATIENT_CLINIC_OR_DEPARTMENT_OTHER): Payer: Self-pay | Admitting: Orthopedic Surgery

## 2019-02-03 NOTE — Anesthesia Postprocedure Evaluation (Signed)
Anesthesia Post Note  Patient: CONSUELLA SCURLOCK  Procedure(s) Performed: SHOULDER ARTHROSCOPY WITH SUBACROMIAL DECOMPRESSION (Right Shoulder) SHOULDER ARTHROSCOPY WITH DISTAL CLAVICLE RESECTION (Right Shoulder)     Patient location during evaluation: PACU Anesthesia Type: General Level of consciousness: awake and alert Pain management: pain level controlled Vital Signs Assessment: post-procedure vital signs reviewed and stable Respiratory status: spontaneous breathing, nonlabored ventilation, respiratory function stable and patient connected to nasal cannula oxygen Cardiovascular status: blood pressure returned to baseline and stable Postop Assessment: no apparent nausea or vomiting Anesthetic complications: no    Last Vitals:  Vitals:   01/31/19 1218 01/31/19 1223  BP:  (!) 143/73  Pulse:  95  Resp:  16  Temp:  36.5 C  SpO2: 92% 92%    Last Pain:  Vitals:   02/03/19 0928  TempSrc:   PainSc: 4                  Ramesh Moan

## 2019-02-05 DIAGNOSIS — M19011 Primary osteoarthritis, right shoulder: Secondary | ICD-10-CM | POA: Diagnosis not present

## 2019-02-05 DIAGNOSIS — S43431D Superior glenoid labrum lesion of right shoulder, subsequent encounter: Secondary | ICD-10-CM | POA: Diagnosis not present

## 2019-02-05 DIAGNOSIS — M25511 Pain in right shoulder: Secondary | ICD-10-CM | POA: Diagnosis not present

## 2019-02-05 DIAGNOSIS — M7541 Impingement syndrome of right shoulder: Secondary | ICD-10-CM | POA: Diagnosis not present

## 2019-02-06 DIAGNOSIS — M19011 Primary osteoarthritis, right shoulder: Secondary | ICD-10-CM | POA: Diagnosis not present

## 2019-02-19 DIAGNOSIS — M7541 Impingement syndrome of right shoulder: Secondary | ICD-10-CM | POA: Diagnosis not present

## 2019-02-19 DIAGNOSIS — M25511 Pain in right shoulder: Secondary | ICD-10-CM | POA: Diagnosis not present

## 2019-02-19 DIAGNOSIS — M19011 Primary osteoarthritis, right shoulder: Secondary | ICD-10-CM | POA: Diagnosis not present

## 2019-02-19 DIAGNOSIS — S43431D Superior glenoid labrum lesion of right shoulder, subsequent encounter: Secondary | ICD-10-CM | POA: Diagnosis not present

## 2019-02-26 DIAGNOSIS — M19011 Primary osteoarthritis, right shoulder: Secondary | ICD-10-CM | POA: Diagnosis not present

## 2019-02-26 DIAGNOSIS — M7541 Impingement syndrome of right shoulder: Secondary | ICD-10-CM | POA: Diagnosis not present

## 2019-02-26 DIAGNOSIS — M25511 Pain in right shoulder: Secondary | ICD-10-CM | POA: Diagnosis not present

## 2019-02-26 DIAGNOSIS — S43431D Superior glenoid labrum lesion of right shoulder, subsequent encounter: Secondary | ICD-10-CM | POA: Diagnosis not present

## 2019-02-27 MED FILL — ATORVASTATIN 80 MG TABLET: 80 | 90 days supply | Qty: 90 | Fill #0

## 2019-04-14 ENCOUNTER — Telehealth: Payer: Self-pay

## 2019-04-14 NOTE — Telephone Encounter (Signed)
Called patient to do their pre-visit COVID screening.  Patient states that she has to reschedule appointment due to a conflicting appointment. Moved appointment to 04/16/2019 @ 1:30 PM.

## 2019-04-15 ENCOUNTER — Ambulatory Visit: Payer: PPO

## 2019-04-15 ENCOUNTER — Telehealth: Payer: Self-pay

## 2019-04-15 DIAGNOSIS — H40053 Ocular hypertension, bilateral: Secondary | ICD-10-CM | POA: Diagnosis not present

## 2019-04-15 DIAGNOSIS — Z961 Presence of intraocular lens: Secondary | ICD-10-CM | POA: Diagnosis not present

## 2019-04-15 DIAGNOSIS — H26491 Other secondary cataract, right eye: Secondary | ICD-10-CM | POA: Diagnosis not present

## 2019-04-15 DIAGNOSIS — H04123 Dry eye syndrome of bilateral lacrimal glands: Secondary | ICD-10-CM | POA: Diagnosis not present

## 2019-04-15 NOTE — Telephone Encounter (Signed)

## 2019-04-16 ENCOUNTER — Ambulatory Visit (INDEPENDENT_AMBULATORY_CARE_PROVIDER_SITE_OTHER): Payer: PPO | Admitting: Nurse Practitioner

## 2019-04-16 ENCOUNTER — Other Ambulatory Visit: Payer: Self-pay

## 2019-04-16 VITALS — BP 120/67 | HR 82 | Temp 97.5°F | Resp 17 | Ht 63.0 in | Wt 198.8 lb

## 2019-04-16 DIAGNOSIS — Z23 Encounter for immunization: Secondary | ICD-10-CM | POA: Diagnosis not present

## 2019-04-16 DIAGNOSIS — I10 Essential (primary) hypertension: Secondary | ICD-10-CM | POA: Diagnosis not present

## 2019-04-16 DIAGNOSIS — E1169 Type 2 diabetes mellitus with other specified complication: Secondary | ICD-10-CM

## 2019-04-16 DIAGNOSIS — E039 Hypothyroidism, unspecified: Secondary | ICD-10-CM

## 2019-04-16 DIAGNOSIS — E119 Type 2 diabetes mellitus without complications: Secondary | ICD-10-CM | POA: Diagnosis not present

## 2019-04-16 DIAGNOSIS — Z1231 Encounter for screening mammogram for malignant neoplasm of breast: Secondary | ICD-10-CM

## 2019-04-16 DIAGNOSIS — Z1211 Encounter for screening for malignant neoplasm of colon: Secondary | ICD-10-CM

## 2019-04-16 DIAGNOSIS — E785 Hyperlipidemia, unspecified: Secondary | ICD-10-CM

## 2019-04-16 NOTE — Progress Notes (Signed)
Assessment & Plan:  Laurie Michael was seen today for diabetes, hypertension, hyperlipidemia and hypothyroidism.  Diagnoses and all orders for this visit:  Type 2 diabetes mellitus without complication, without long-term current use of insulin (HCC) -     Hemoglobin A1c Continue blood sugar control as discussed in office today, low you good later on today visual remembering something on top #1 everything abnormal and we have been like that she does okay carbohydrate diet, and regular physical exercise as tolerated, 150 minutes per week (30 min each day, 5 days per week, or 50 min 3 days per week). Keep blood sugar logs with fasting goal of 90-130 mg/dl, post prandial (after you eat) less than 180.  For Hypoglycemia: BS <60 and Hyperglycemia BS >400; contact the clinic ASAP. Annual eye exams and foot exams are recommended.   Essential hypertension Continue all antihypertensives as prescribed.  Remember to bring in your blood pressure log with you for your follow up appointment.  DASH/Mediterranean Diets are healthier choices for HTN.    Hyperlipidemia associated with type 2 diabetes mellitus ([place]) INSTRUCTIONS: Work on a low fat, heart healthy diet and participate in regular aerobic exercise program by working out at least 150 minutes per week; 5 days a week-30 minutes per day. Avoid red meat, fried foods. junk foods, sodas, sugary drinks, unhealthy snacking, alcohol and smoking.  Drink at least 48oz of water per day and monitor your carbohydrate intake daily.   Hypothyroidism, unspecified type -     TSH  Breast cancer screening by mammogram -     MM 3D SCREEN BREAST BILATERAL; Future  Colon cancer screening -     Ambulatory referral to Gastroenterology  Needs flu shot -     Flu Vaccine QUAD 6+ mos PF IM (Fluarix Quad PF)    Patient has been counseled on age-appropriate routine health concerns for screening and prevention. These are reviewed and up-to-date. Referrals have been placed  accordingly. Immunizations are up-to-date or declined.    Subjective:   Chief Complaint  Patient presents with  . Diabetes  . Hypertension  . Hyperlipidemia  . Hypothyroidism   HPI Laurie Michael 68 y.o. female presents to office today for follow up.  has a past medical history of Complication of anesthesia, Diabetes mellitus without complication ([place]), Diverticulosis (08/2018), Family history of adverse reaction to anesthesia, Hypertension, Hypothyroidism, Irritable bowel, Obesity, and PONV (postoperative nausea and vomiting).  Hypertension Well controlled.  She is not exercising and is not consistently adherent to low salt diet.  She does not have a blood pressure log today.  She is currently taking lisinopril 10 mg daily as prescribed. Blood pressure is well controlled at home.  Cardiac symptoms none.  Cardiovascular risk factors: advanced age (older than 65 for men, 62 for women), diabetes mellitus, dyslipidemia, hypertension, obesity (BMI >= 30 kg/m2) and sedentary lifestyle.  BP Readings from Last 3 Encounters:  04/16/19 120/67  01/31/19 (!) 143/73  01/13/19 140/84    Hyperlipidemia LDL at goal <70. Patient presents for follow up to hyperlipidemia.  She is medication compliant taking atorvastatin 80mg  daily. She is not diet compliant and denies statin intolerance including myalgias.  Lab Results  Component Value Date   CHOL 136 01/13/2019   Lab Results  Component Value Date   HDL 51 01/13/2019   Lab Results  Component Value Date   LDLCALC 43 01/13/2019   Lab Results  Component Value Date   TRIG 209 (H) 01/13/2019   Lab Results  Component Value Date   CHOLHDL 2.7 01/13/2019    Diabetes Mellitus Type II Current symptoms/problems include hyperglycemia 150s and have been unchanged.  Known diabetic complications: cardiovascular disease Current diabetic medications include: Glipizide 10 mg daily and metformin 1000 mg twice daily.  Unfortunately patient states she  has only been taking 1000 mg once a day and was not aware that it was to be twice a day. Eye exam current (within one year): Yes. Yesterday 04-16-2019 Weight trend: decreasing steadily Prior visit with dietician: no Current monitoring regimen: home blood tests - daily Home blood sugar records: fasting range: 140-150s Any episodes of hypoglycemia? no Is She on ACE inhibitor or angiotensin II receptor blocker?  Yes  Lab Results  Component Value Date   HGBA1C 8.8 (H) 07/29/2018   HGBA1C 8.4 (A) 01/23/2018   HGBA1C 6.9 07/17/2017    Hypothyroidism She denies weight changes, heat/cold intolerance, bowel/skin changes or CVS symptoms.  She does endorse fatigue however she does not exercise and diet is not compliant in regard to carb control. The symptoms are mild and moderate.  The problem has been unchanged.  Previous thyroid studies include TSH, triiodothyronine free and total T4. The hypothyroidism is due to hypothyroidism.  Lab Results  Component Value Date   TSH 2.200 01/13/2019   Review of Systems  Constitutional: Positive for malaise/fatigue. Negative for fever and weight loss.  HENT: Negative.  Negative for nosebleeds.   Eyes: Negative.  Negative for blurred vision, double vision and photophobia.  Respiratory: Negative.  Negative for cough and shortness of breath.   Cardiovascular: Negative.  Negative for chest pain, palpitations and leg swelling.  Gastrointestinal: Negative.  Negative for heartburn, nausea and vomiting.  Musculoskeletal: Negative.  Negative for myalgias.  Neurological: Negative.  Negative for dizziness, focal weakness, seizures and headaches.  Psychiatric/Behavioral: Negative.  Negative for suicidal ideas.    Past Medical History:  Diagnosis Date  . Complication of anesthesia   . Diabetes mellitus without complication ([place])   . Diverticulosis 08/2018   partial colectomy  . Family history of adverse reaction to anesthesia    mom is slow to wake up  .  Hypertension   . Hypothyroidism   . Irritable bowel   . Obesity   . PONV (postoperative nausea and vomiting)     Past Surgical History:  Procedure Laterality Date  . CATARACT EXTRACTION, BILATERAL  2017  . COLON SURGERY  08/2018   partial colectomy for diverticulosis  . COLONOSCOPY N/A 07/12/2016   Procedure: COLONOSCOPY;  Surgeon: Wonda Horner, MD;  Location: [facility] ENDOSCOPY;  Service: Endoscopy;  Laterality: N/A;  . HYSTEROSCOPY W/ ENDOMETRIAL ABLATION    . SHOULDER ARTHROSCOPY WITH DISTAL CLAVICLE RESECTION Right 01/31/2019   Procedure: SHOULDER ARTHROSCOPY WITH DISTAL CLAVICLE RESECTION;  Surgeon: Earlie Server, MD;  Location: [facility];  Service: Orthopedics;  Laterality: Right;  . SHOULDER ARTHROSCOPY WITH SUBACROMIAL DECOMPRESSION Right 01/31/2019   Procedure: SHOULDER ARTHROSCOPY WITH SUBACROMIAL DECOMPRESSION;  Surgeon: Earlie Server, MD;  Location: [facility];  Service: Orthopedics;  Laterality: Right;  . TUBAL LIGATION    . WISDOM TOOTH EXTRACTION      Family History  Problem Relation Age of Onset  . Diabetes Mother   . Breast cancer Mother   . Heart disease Father   . Cancer Brother   . Breast cancer Sister     Social History Reviewed with no changes to be made today.   Outpatient Medications Prior to Visit  Medication Sig  Dispense Refill  . Ascorbic Acid (VITAMIN C) 1000 MG tablet Take 1,000 mg by mouth daily.    Marland Kitchen atorvastatin (LIPITOR) 80 MG tablet Take 1 tablet (80 mg total) by mouth daily at 6 PM. 90 tablet 3  . calcium-vitamin D (OSCAL WITH D) 500-200 MG-UNIT tablet Take 2 tablets by mouth daily.    Marland Kitchen glipiZIDE (GLUCOTROL XL) 10 MG 24 hr tablet Take 1 tablet (10 mg total) by mouth daily. 60 tablet 3  . glucose blood (ONE TOUCH ULTRA TEST) test strip Use to check blood sugar once daily. Diagnosis code E11.9 100 each 12  . levothyroxine (SYNTHROID, LEVOTHROID) 75 MCG tablet Take 1 tablet (75 mcg total) by mouth daily. 30 tablet  11  . lisinopril (ZESTRIL) 10 MG tablet Take 1 tablet (10 mg total) by mouth daily. 90 tablet 3  . magnesium 30 MG tablet Take 30 mg by mouth 2 (two) times daily.    . metFORMIN (GLUCOPHAGE) 1000 MG tablet Take 1 tablet (1,000 mg total) by mouth 2 (two) times daily with a meal. 180 tablet 3   No facility-administered medications prior to visit.     No Known Allergies     Objective:    BP 120/67   Pulse 82   Temp (!) 97.5 F (36.4 C) (Temporal)   Resp 17   Ht 5\' 3"  (1.6 m)   Wt 198 lb 12.8 oz (90.2 kg)   SpO2 97%   BMI 35.22 kg/m  Wt Readings from Last 3 Encounters:  04/16/19 198 lb 12.8 oz (90.2 kg)  01/31/19 194 lb 14.2 oz (88.4 kg)  01/13/19 194 lb 3.2 oz (88.1 kg)    Physical Exam Vitals signs and nursing note reviewed.  Constitutional:      Appearance: She is well-developed.  HENT:     Head: Normocephalic and atraumatic.  Neck:     Musculoskeletal: Normal range of motion.  Cardiovascular:     Rate and Rhythm: Normal rate and regular rhythm.     Heart sounds: Normal heart sounds. No murmur. No friction rub. No gallop.   Pulmonary:     Effort: Pulmonary effort is normal. No tachypnea or respiratory distress.     Breath sounds: Normal breath sounds. No decreased breath sounds, wheezing, rhonchi or rales.  Chest:     Chest wall: No tenderness.  Abdominal:     General: Bowel sounds are normal.     Palpations: Abdomen is soft.  Musculoskeletal: Normal range of motion.  Skin:    General: Skin is warm and dry.  Neurological:     Mental Status: She is alert and oriented to person, place, and time.     Coordination: Coordination normal.  Psychiatric:        Behavior: Behavior normal. Behavior is cooperative.        Thought Content: Thought content normal.        Judgment: Judgment normal.        Patient has been counseled extensively about nutrition and exercise as well as the importance of adherence with medications and regular follow-up. The patient was given  clear instructions to go to ER or return to medical center if symptoms don't improve, worsen or new problems develop. The patient verbalized understanding.   Follow-up: Return in about 6 months (around 10/14/2019).   Gildardo Pounds, FNP-BC [facility] and [place], [place]   04/16/2019, 3:05 PM

## 2019-04-16 NOTE — Progress Notes (Signed)
States that fasting FSBS have been running in the 130s-140s.

## 2019-04-17 LAB — HEMOGLOBIN A1C
Est. average glucose Bld gHb Est-mCnc: 157 mg/dL
Hgb A1c MFr Bld: 7.1 % — ABNORMAL HIGH (ref 4.8–5.6)

## 2019-04-17 LAB — TSH: TSH: 1.19 u[IU]/mL (ref 0.450–4.500)

## 2019-04-22 DIAGNOSIS — H52203 Unspecified astigmatism, bilateral: Secondary | ICD-10-CM | POA: Diagnosis not present

## 2019-04-22 DIAGNOSIS — H43391 Other vitreous opacities, right eye: Secondary | ICD-10-CM | POA: Diagnosis not present

## 2019-04-22 DIAGNOSIS — Z961 Presence of intraocular lens: Secondary | ICD-10-CM | POA: Diagnosis not present

## 2019-04-22 DIAGNOSIS — H524 Presbyopia: Secondary | ICD-10-CM | POA: Diagnosis not present

## 2019-05-01 DIAGNOSIS — M19011 Primary osteoarthritis, right shoulder: Secondary | ICD-10-CM | POA: Diagnosis not present

## 2019-05-26 MED FILL — ATORVASTATIN 80 MG TABLET: 80 | 90 days supply | Qty: 90 | Fill #1

## 2019-08-13 ENCOUNTER — Other Ambulatory Visit: Payer: Self-pay | Admitting: Family Medicine

## 2019-08-13 DIAGNOSIS — E039 Hypothyroidism, unspecified: Secondary | ICD-10-CM

## 2019-08-13 NOTE — Telephone Encounter (Signed)
Requested medication (s) are due for refill today: yes  Requested medication (s) are on the active medication list: yes  Last refill:  05/18/2019  Future visit scheduled: yes  Notes to clinic:  review medication for refill   Requested Prescriptions  Pending Prescriptions Disp Refills   lisinopril (ZESTRIL) 10 MG tablet [Pharmacy Med Name: Lisinopril 10 MG Oral Tablet] 90 tablet 0    Sig: Take 1 tablet by mouth once daily      There is no refill protocol information for this order      EUTHYROX 75 MCG tablet [Pharmacy Med Name: Euthyrox 75 MCG Oral Tablet] 30 tablet 0    Sig: Take 1 tablet by mouth once daily      There is no refill protocol information for this order

## 2019-08-18 DIAGNOSIS — H5213 Myopia, bilateral: Secondary | ICD-10-CM | POA: Diagnosis not present

## 2019-08-18 DIAGNOSIS — H524 Presbyopia: Secondary | ICD-10-CM | POA: Diagnosis not present

## 2019-08-18 DIAGNOSIS — H52209 Unspecified astigmatism, unspecified eye: Secondary | ICD-10-CM | POA: Diagnosis not present

## 2019-08-28 MED FILL — ATORVASTATIN 80 MG TABLET: 80 | 90 days supply | Qty: 90 | Fill #2

## 2019-09-24 ENCOUNTER — Other Ambulatory Visit: Payer: Self-pay | Admitting: Family Medicine

## 2019-09-24 DIAGNOSIS — E039 Hypothyroidism, unspecified: Secondary | ICD-10-CM

## 2019-09-24 NOTE — Telephone Encounter (Signed)
Patient at [facility]- sent for review of Rx

## 2019-09-24 NOTE — Telephone Encounter (Signed)
Patient is no longer under my care. New provider at [facility], Dr. Juleen China. Please forward to their office.

## 2019-10-13 ENCOUNTER — Telehealth: Payer: Self-pay | Admitting: Family Medicine

## 2019-10-13 NOTE — Telephone Encounter (Signed)
Called patient to remind of phone appointment, left VM

## 2019-10-14 ENCOUNTER — Ambulatory Visit (INDEPENDENT_AMBULATORY_CARE_PROVIDER_SITE_OTHER): Payer: Medicare HMO | Admitting: Internal Medicine

## 2019-10-14 ENCOUNTER — Encounter: Payer: Self-pay | Admitting: Internal Medicine

## 2019-10-14 DIAGNOSIS — E039 Hypothyroidism, unspecified: Secondary | ICD-10-CM | POA: Diagnosis not present

## 2019-10-14 DIAGNOSIS — I10 Essential (primary) hypertension: Secondary | ICD-10-CM | POA: Diagnosis not present

## 2019-10-14 DIAGNOSIS — E1169 Type 2 diabetes mellitus with other specified complication: Secondary | ICD-10-CM | POA: Diagnosis not present

## 2019-10-14 DIAGNOSIS — E785 Hyperlipidemia, unspecified: Secondary | ICD-10-CM

## 2019-10-14 DIAGNOSIS — E119 Type 2 diabetes mellitus without complications: Secondary | ICD-10-CM

## 2019-10-14 MED ORDER — LISINOPRIL 10 MG PO TABS: 10.0000 mg | ORAL_TABLET | Freq: Every day | ORAL | 3 refills | Status: DC

## 2019-10-14 MED ORDER — METFORMIN HCL 1000 MG PO TABS: 1000.0000 mg | ORAL_TABLET | Freq: Two times a day (BID) | ORAL | 3 refills | Status: DC

## 2019-10-14 MED ORDER — GLUCOSE BLOOD VI STRP: ORAL_STRIP | 12 refills | Status: DC

## 2019-10-14 MED ORDER — ATORVASTATIN CALCIUM 80 MG PO TABS: 80.0000 mg | ORAL_TABLET | Freq: Every day | ORAL | 3 refills | Status: DC

## 2019-10-14 MED ORDER — GLIPIZIDE ER 10 MG PO TB24: 10.0000 mg | ORAL_TABLET | Freq: Every day | ORAL | 3 refills | Status: DC

## 2019-10-14 MED ORDER — LEVOTHYROXINE SODIUM 75 MCG PO TABS: 75.0000 ug | ORAL_TABLET | Freq: Every day | ORAL | 3 refills | Status: DC

## 2019-10-14 NOTE — Progress Notes (Addendum)
Virtual Visit via Telephone Note  I connected with Laurie Michael, on 10/14/2019 at 2:11 PM by telephone due to the COVID-19 pandemic and verified that I am speaking with the correct person using two identifiers.   Consent: I discussed the limitations, risks, security and privacy concerns of performing an evaluation and management service by telephone and the availability of in person appointments. I also discussed with the patient that there may be a patient responsible charge related to this service. The patient expressed understanding and agreed to proceed.   Location of Patient: Home   Location of Provider: Clinic    Persons participating in Telemedicine visit: Merliah Pluchino [facility] Dr. Juleen China      History of Present Illness: Patient has a visit to f/u on HTN, DM, HLD.   Chronic HTN Disease Monitoring:  Home BP Monitoring - Not consistently. When she has checked, diastolic [id number]. Cant remember what the top number has been.  Chest pain- no  Dyspnea- no Headache - no  Medications: Lisinopril 10 mg  Compliance- yes Lightheadedness- no  Edema- no   Preventitive Healthcare:  Exercise: yes, walking when weather permits. Had a treadmill but it was acting up. Planning to get a new treadmill.    Diet Pattern: DASH diet   Salt Restriction: Yes    Diabetes mellitus, Type 2 Disease Monitoring             Blood Sugar Ranges: Fasting - avg slightly >100, highest 130s              Polyuria: no              Visual problems: no   Urine Microalbumin Needs to be checked. On Lisinopril   Last A1C: 7.1 (Sept 2020)   Medications: Metformin 1000 mg BID, Glipizide 10 mg daily  Medication Compliance: yes  Medication Side Effects             Hypoglycemia: no   Preventitive Health Care             Eye Exam: UTD              Foot Exam: Needs at next visit        Past Medical History:  Diagnosis Date  . Complication of anesthesia   . Diabetes  mellitus without complication ([place])   . Diverticulosis 08/2018   partial colectomy  . Family history of adverse reaction to anesthesia    mom is slow to wake up  . Hypertension   . Hypothyroidism   . Irritable bowel   . Obesity   . PONV (postoperative nausea and vomiting)    No Known Allergies  Current Outpatient Medications on File Prior to Visit  Medication Sig Dispense Refill  . Ascorbic Acid (VITAMIN C) 1000 MG tablet Take 1,000 mg by mouth daily.    Marland Kitchen atorvastatin (LIPITOR) 80 MG tablet Take 1 tablet (80 mg total) by mouth daily at 6 PM. 90 tablet 3  . calcium-vitamin D (OSCAL WITH D) 500-200 MG-UNIT tablet Take 2 tablets by mouth daily.    Arna Medici 75 MCG tablet Take 1 tablet by mouth once daily 30 tablet 0  . glipiZIDE (GLUCOTROL XL) 10 MG 24 hr tablet Take 1 tablet (10 mg total) by mouth daily. 60 tablet 3  . glucose blood (ONE TOUCH ULTRA TEST) test strip Use to check blood sugar once daily. Diagnosis code E11.9 100 each 12  . lisinopril (ZESTRIL) 10 MG tablet Take 1  tablet by mouth once daily 90 tablet 0  . magnesium 30 MG tablet Take 30 mg by mouth 2 (two) times daily.    . metFORMIN (GLUCOPHAGE) 1000 MG tablet Take 1 tablet (1,000 mg total) by mouth 2 (two) times daily with a meal. 180 tablet 3   No current facility-administered medications on file prior to visit.    Observations/Objective: NAD. Speaking clearly.  Work of breathing normal.  Alert and oriented. Mood appropriate.   Assessment and Plan: 1. Type 2 diabetes mellitus without complication, without long-term current use of insulin (HCC) Fasting CBGs mostly at goal. Return for repeat lab visit. If A1c improved, may consider decreasing Glipizide due to risk of hypoglycemia especially with age.  Counseled on Diabetic diet, my plate method, [id number] minutes of moderate intensity exercise/week Blood sugar logs with fasting goals of 80-120 mg/dl, random of less than 180 and in the event of sugars less than 60 mg/dl or  greater than 400 mg/dl encouraged to notify the clinic. Advised on the need for annual eye exams, annual foot exams, Pneumonia vaccine. - Hemoglobin A1c; Future - Microalbumin / creatinine urine ratio; Future - glipiZIDE (GLUCOTROL XL) 10 MG 24 hr tablet; Take 1 tablet (10 mg total) by mouth daily.  Dispense: 90 tablet; Refill: 3 - lisinopril (ZESTRIL) 10 MG tablet; Take 1 tablet (10 mg total) by mouth daily.  Dispense: 90 tablet; Refill: 3 - glucose blood (ONE TOUCH ULTRA TEST) test strip; Use to check blood sugar once daily. Diagnosis code E11.9  Dispense: 100 each; Refill: 12 - metFORMIN (GLUCOPHAGE) 1000 MG tablet; Take 1 tablet (1,000 mg total) by mouth 2 (two) times daily with a meal.  Dispense: 180 tablet; Refill: 3  2. Essential hypertension Reviewed prior BPs and mostly at goal. Encouraged her to check more frequently at home for monitoring.  Counseled on blood pressure goal of less than 130/80, low-sodium, DASH diet, medication compliance, 150 minutes of moderate intensity exercise per week. Discussed medication compliance, adverse effects. - lisinopril (ZESTRIL) 10 MG tablet; Take 1 tablet (10 mg total) by mouth daily.  Dispense: 90 tablet; Refill: 3  3. Hyperlipidemia associated with type 2 diabetes mellitus ([place]) Last LDL 43 in June 2020.  - atorvastatin (LIPITOR) 80 MG tablet; Take 1 tablet (80 mg total) by mouth daily at 6 PM.  Dispense: 90 tablet; Refill: 3  4. Hypothyroidism, unspecified type Last TSH WNL at 1.19 in Sept 2020.  - levothyroxine (EUTHYROX) 75 MCG tablet; Take 1 tablet (75 mcg total) by mouth daily.  Dispense: 90 tablet; Refill: 3   Follow Up Instructions: Lab visit, 3 month for provider visit    I discussed the assessment and treatment plan with the patient. The patient was provided an opportunity to ask questions and all were answered. The patient agreed with the plan and demonstrated an understanding of the instructions.   The patient was advised to call  back or seek an in-person evaluation if the symptoms worsen or if the condition fails to improve as anticipated.    I provided 20 minutes total of non-face-to-face time during this encounter including median intraservice time, reviewing previous notes, investigations, ordering medications, medical decision making, coordinating care and patient verbalized understanding at the end of the visit.    Phill Myron, D.O. Primary Care at [facility]  10/14/2019, 2:11 PM

## 2019-10-14 NOTE — Addendum Note (Signed)
Addended by: Melina Schools on: 10/14/2019 02:37 PM   Modules accepted: Orders

## 2019-10-15 ENCOUNTER — Other Ambulatory Visit: Payer: Self-pay

## 2019-10-15 ENCOUNTER — Other Ambulatory Visit: Payer: PPO

## 2019-10-15 DIAGNOSIS — E119 Type 2 diabetes mellitus without complications: Secondary | ICD-10-CM | POA: Diagnosis not present

## 2019-10-15 NOTE — Progress Notes (Signed)
Patient here for labs ordered during her recent telehealth visit.

## 2019-10-16 LAB — HEMOGLOBIN A1C
Est. average glucose Bld gHb Est-mCnc: 140 mg/dL
Hgb A1c MFr Bld: 6.5 % — ABNORMAL HIGH (ref 4.8–5.6)

## 2019-10-16 LAB — MICROALBUMIN / CREATININE URINE RATIO
Creatinine, Urine: 133.4 mg/dL
Microalb/Creat Ratio: 10 mg/g creat (ref 0–29)
Microalbumin, Urine: 13.1 ug/mL

## 2019-10-20 ENCOUNTER — Telehealth: Payer: Self-pay | Admitting: Internal Medicine

## 2019-10-20 NOTE — Progress Notes (Signed)
Normal lab letter mailed.

## 2019-10-20 NOTE — Telephone Encounter (Signed)
Patient called asking for a return call for labs.

## 2019-10-21 ENCOUNTER — Other Ambulatory Visit: Payer: Self-pay

## 2019-10-21 MED ORDER — TRUE METRIX BLOOD GLUCOSE TEST VI STRP: ORAL_STRIP | 2 refills | Status: DC

## 2019-10-21 MED ORDER — TRUEPLUS LANCETS 30G MISC: 2 refills | Status: DC

## 2019-10-21 MED ORDER — TRUE METRIX METER W/DEVICE KIT: PACK | 0 refills | Status: DC

## 2019-11-05 DIAGNOSIS — H26492 Other secondary cataract, left eye: Secondary | ICD-10-CM | POA: Diagnosis not present

## 2020-02-19 IMAGING — DX DG HUMERUS 2V *L*
2 series · 2 of 2 positions shown · non-contrast
Comparison: None.

CLINICAL DATA: Pain in left shoulder for the past 2 months.

EXAM:
LEFT HUMERUS - 2+ VIEW

[humerus lat]
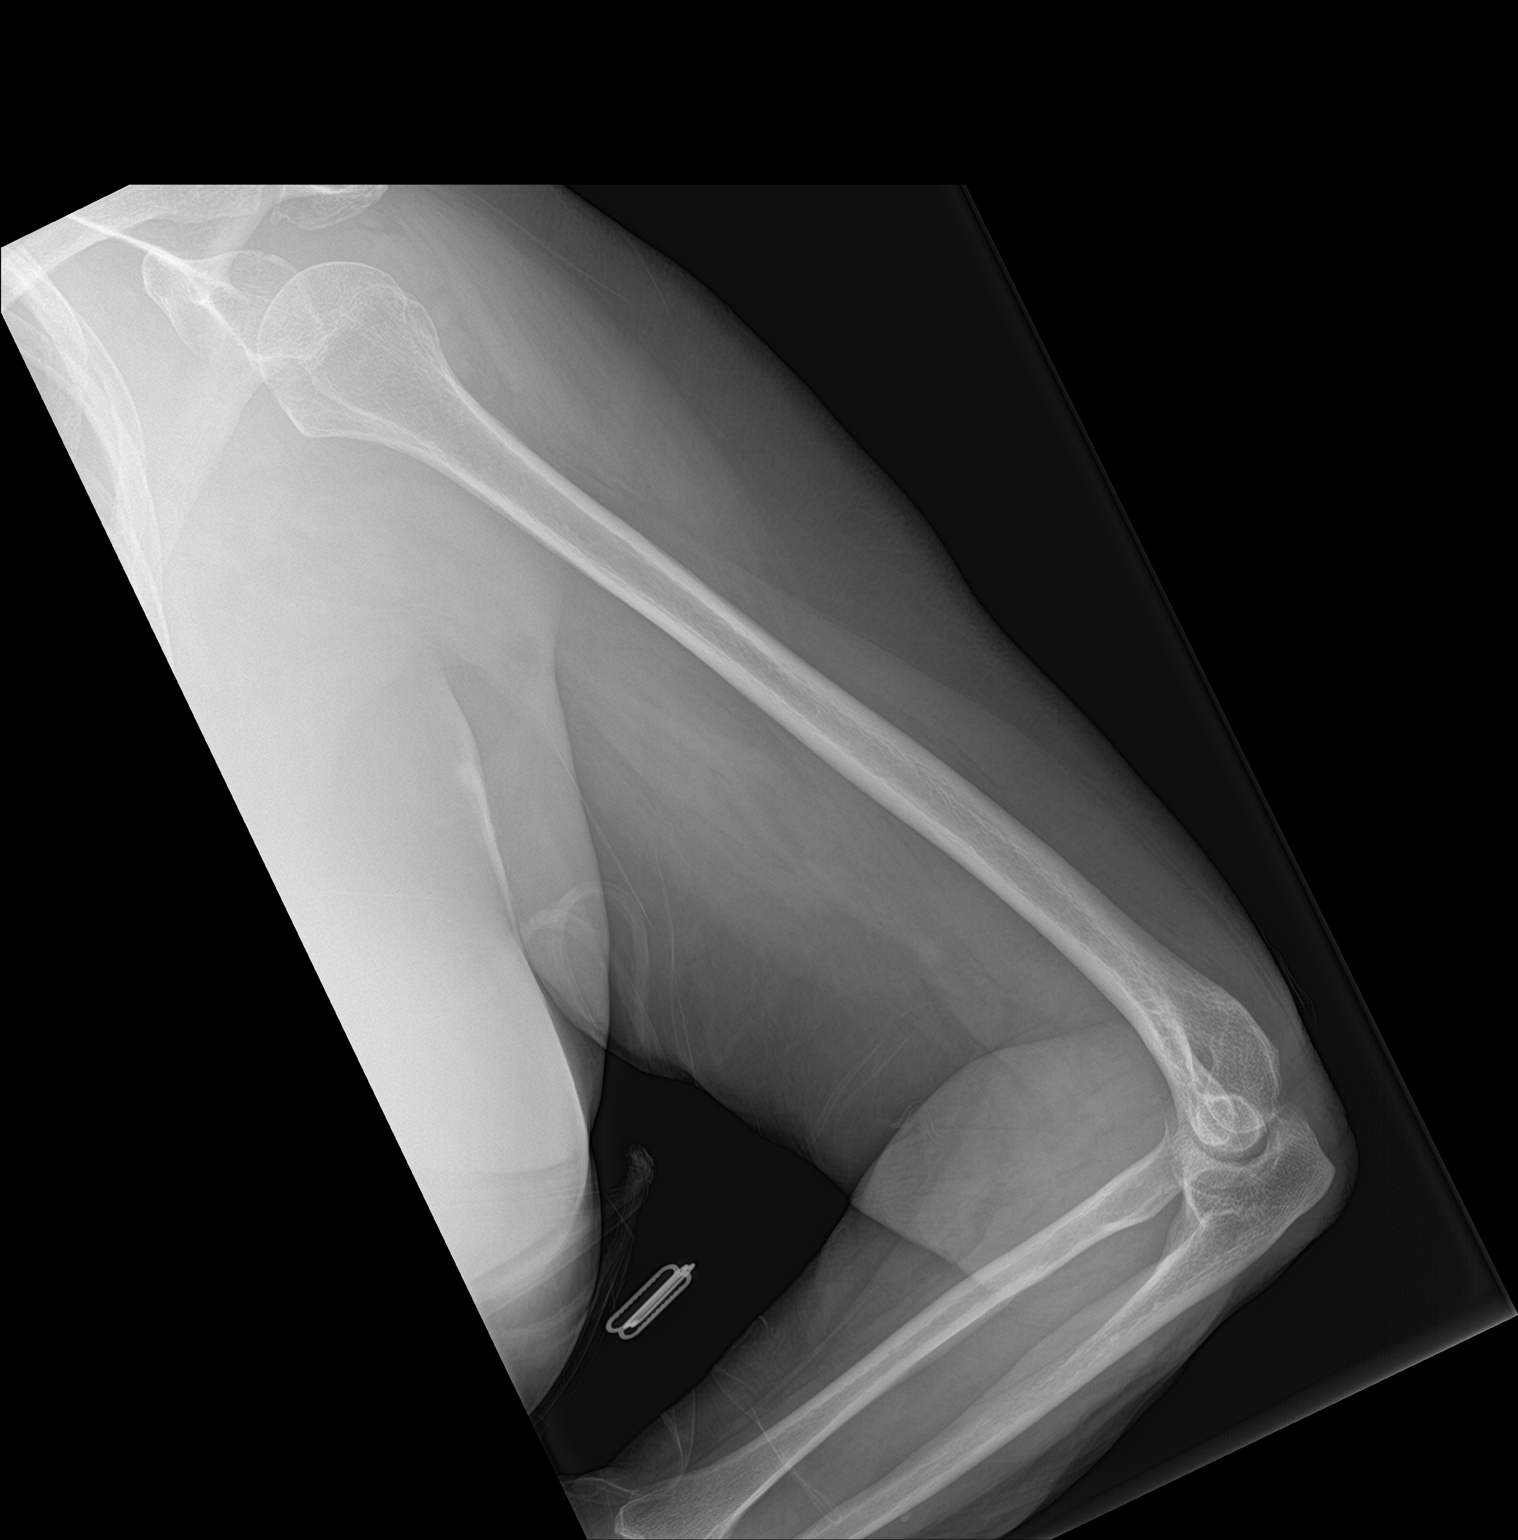

[humerus ap]
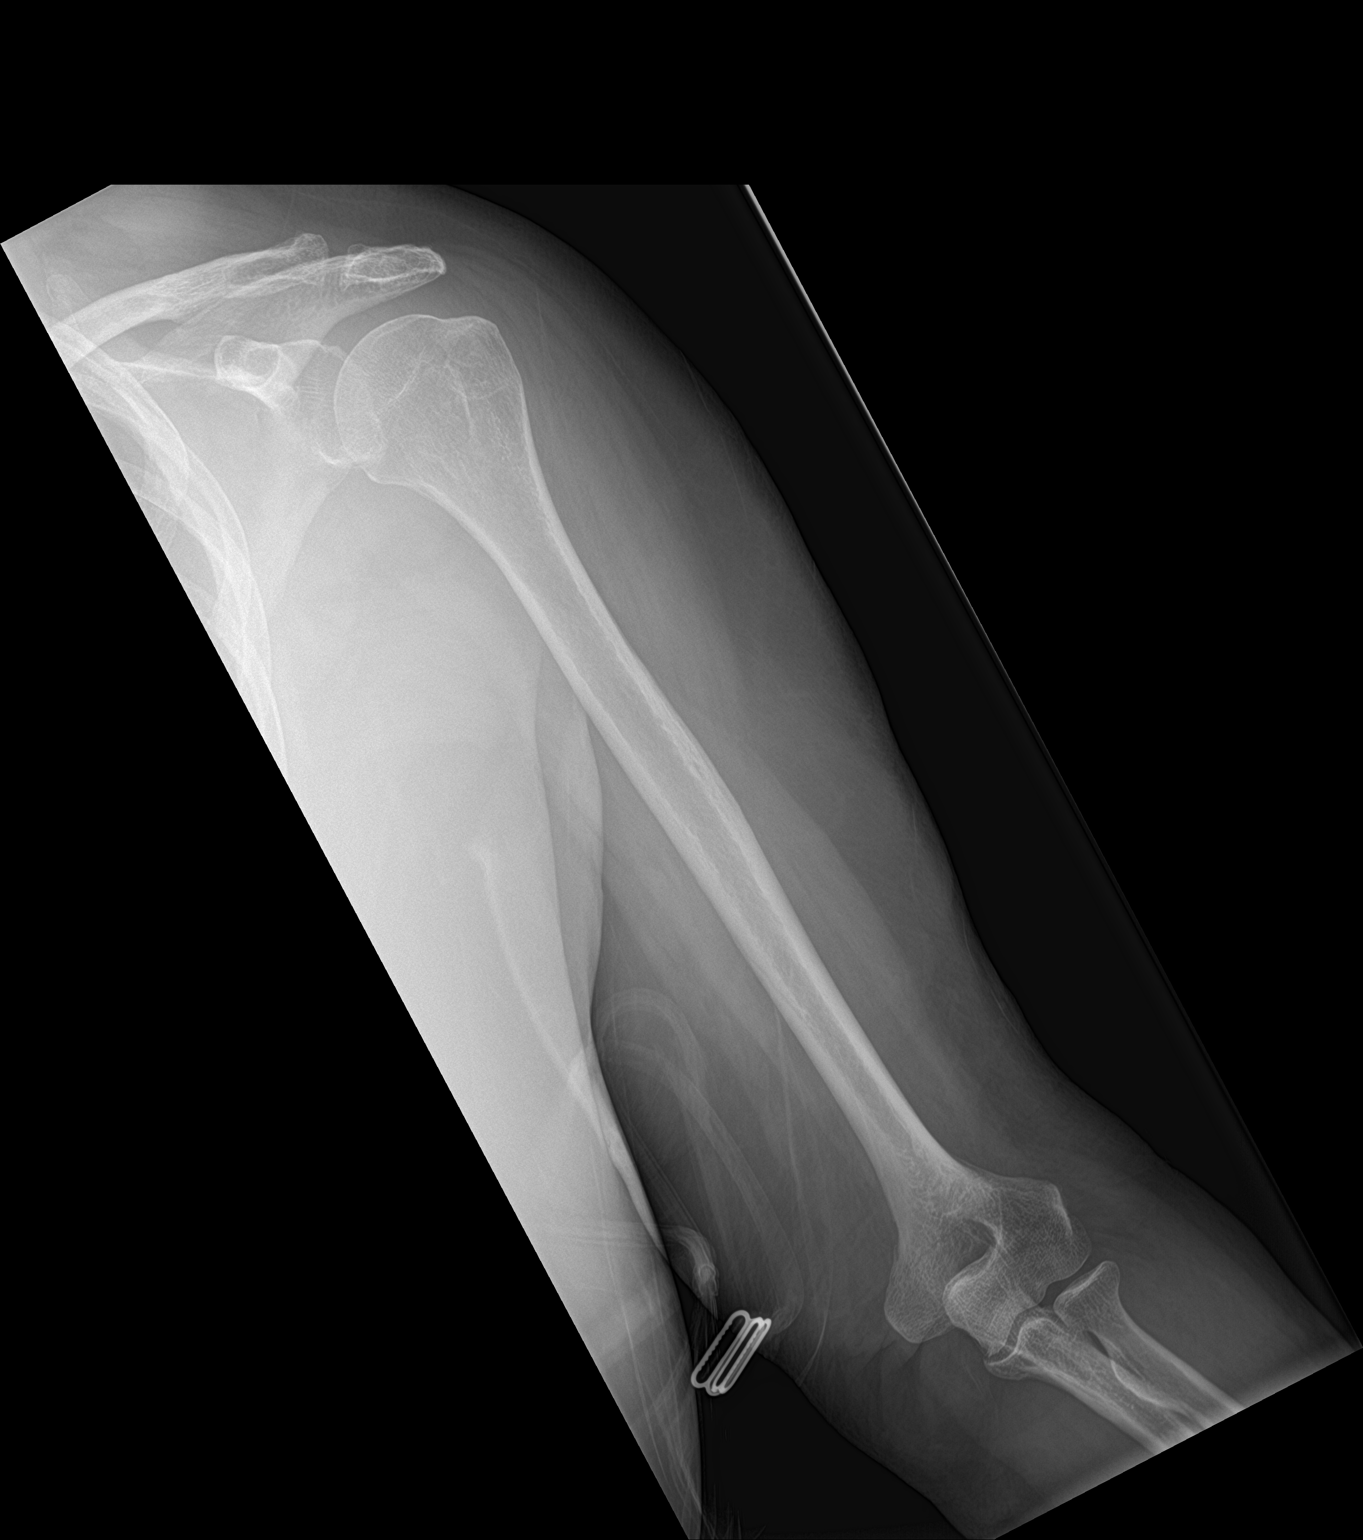

[2 of 2 positions shown; findings below may reference images not displayed]

FINDINGS: Osteoarthritis of the AC joint with undersurface spurring noted. The
humerus appears intact. No joint dislocation, suspicious osseous
lesions nor fracture. No abnormal soft tissue calcification or mass.
IMPRESSION: Osteoarthritis of the AC joint with undersurface spurring noted.
Intact left humerus without acute osseous abnormality.

## 2020-02-19 IMAGING — DX DG SHOULDER 2+V*L*
3 series · 3 of 3 positions shown · non-contrast
Comparison: None.

CLINICAL DATA: Left shoulder pain

EXAM:
LEFT SHOULDER - 2+ VIEW

[shoulder grashey]
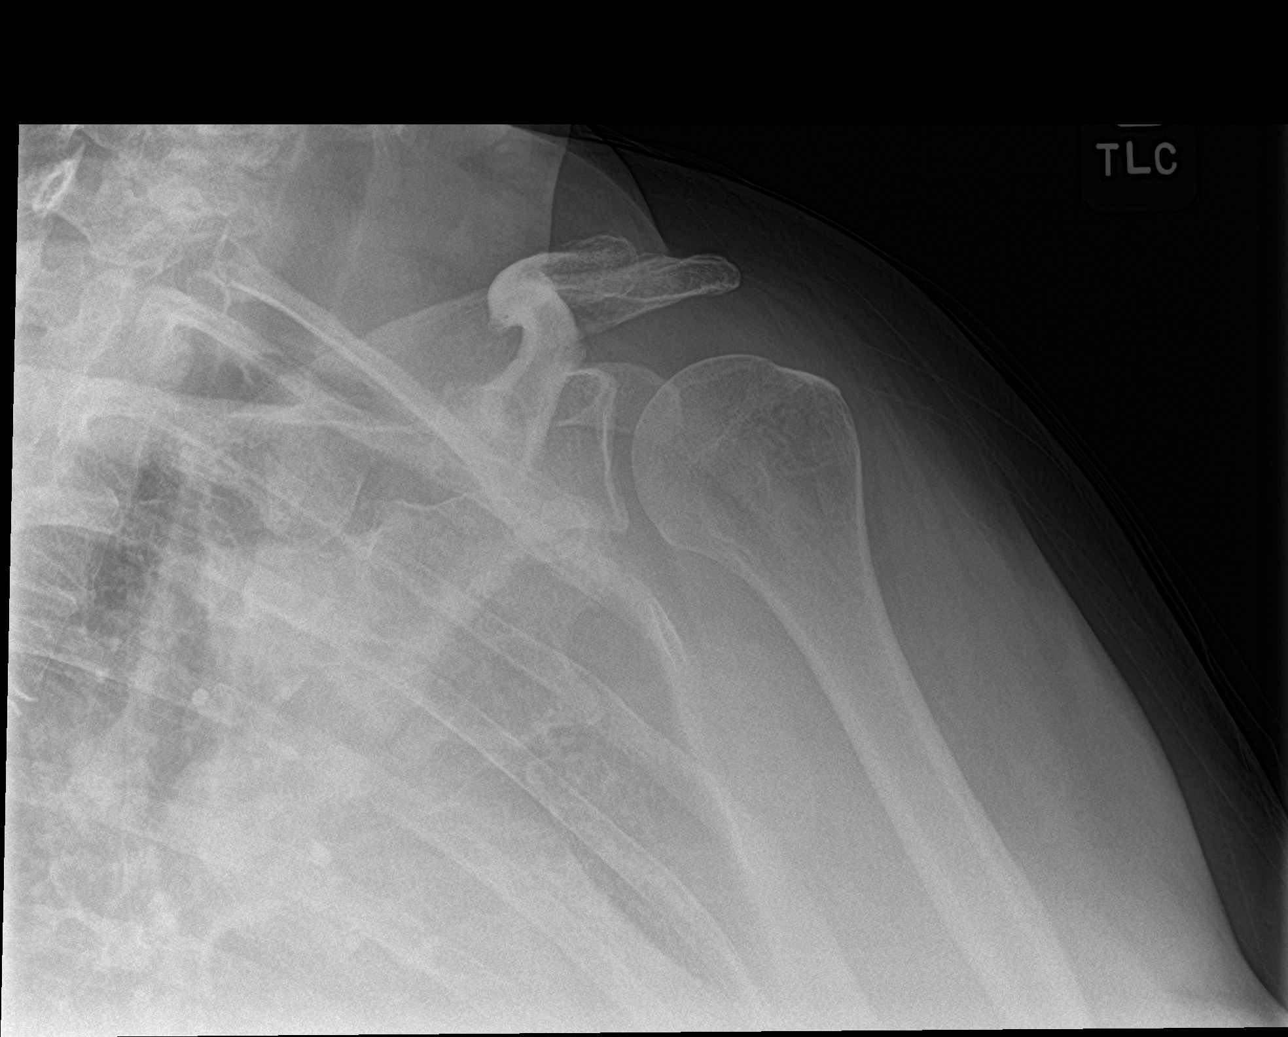

[shoulder axillary]
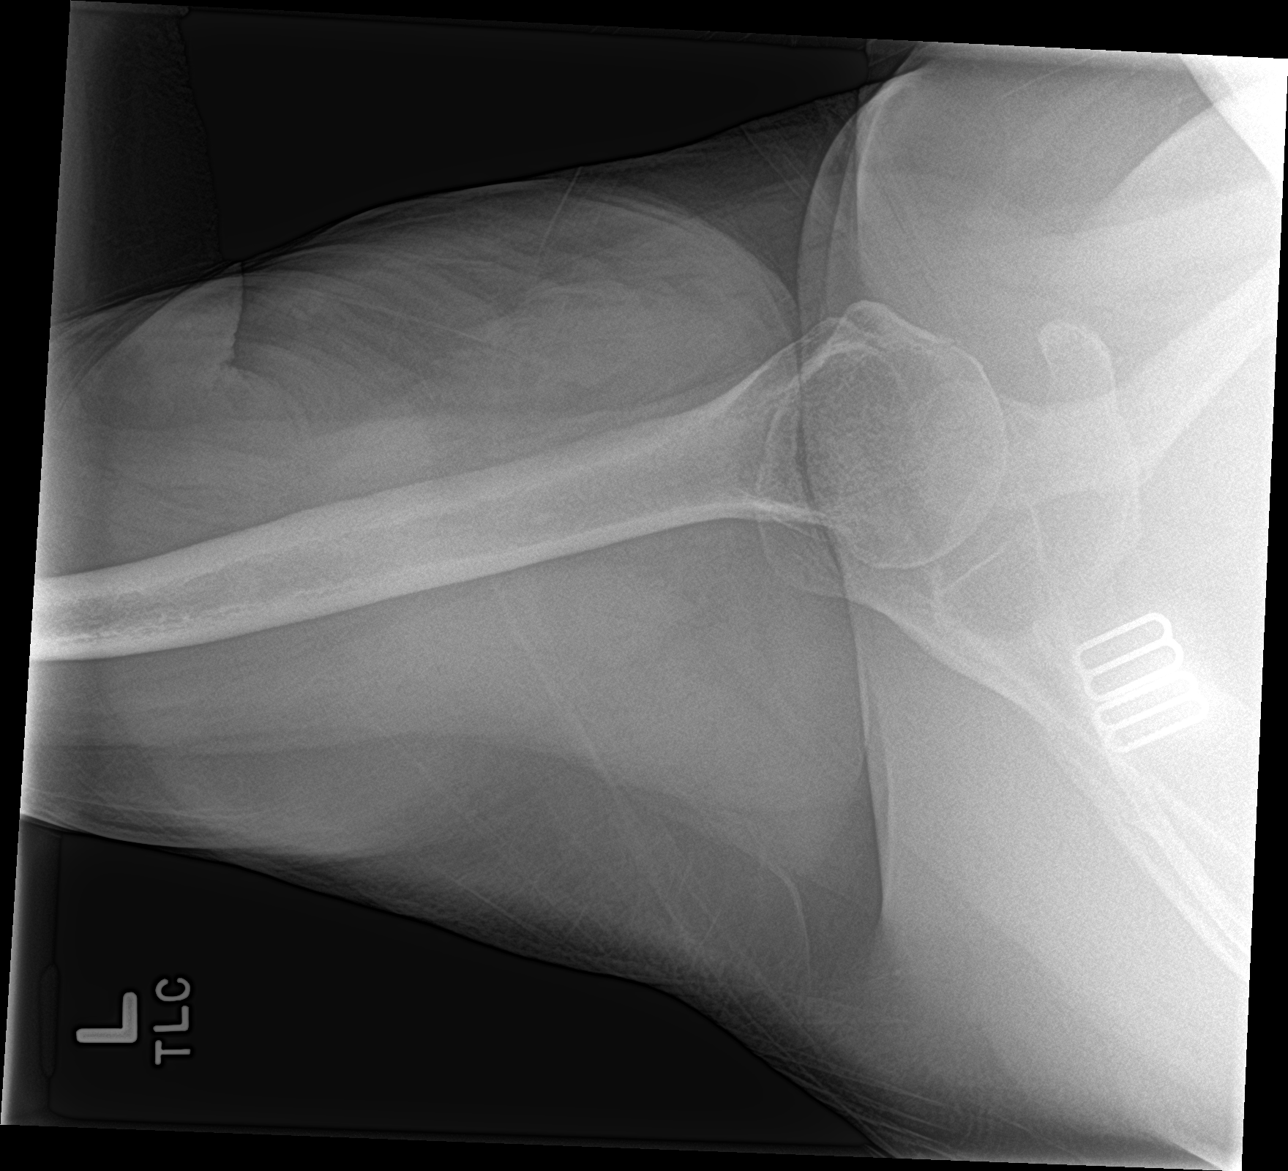

[shoulder y view]
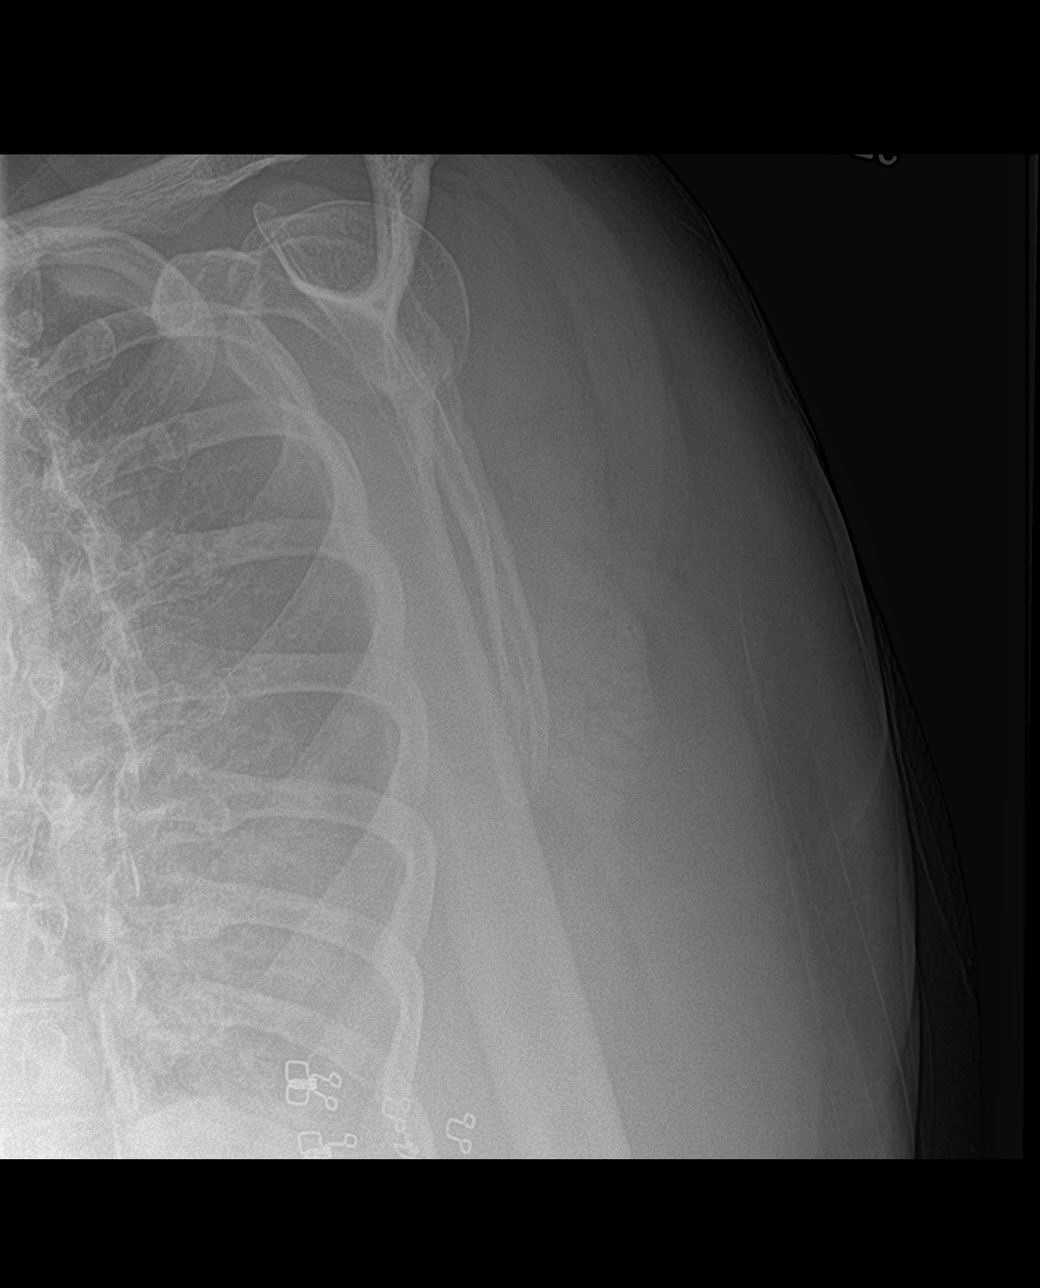

[3 of 3 positions shown; findings below may reference images not displayed]

FINDINGS: There is no evidence of fracture or dislocation. Osteoarthritis of
the AC joint minimal AC joint spurring along the undersurface.. The
adjacent included ribs and lung are nonacute. Soft tissues are
unremarkable.
IMPRESSION: AC joint osteoarthritis with undersurface spurring. No acute osseous
abnormality or joint dislocation of the left shoulder.

## 2020-05-24 ENCOUNTER — Ambulatory Visit (INDEPENDENT_AMBULATORY_CARE_PROVIDER_SITE_OTHER): Payer: Medicare HMO | Admitting: Internal Medicine

## 2020-05-24 ENCOUNTER — Encounter: Payer: Self-pay | Admitting: Internal Medicine

## 2020-05-24 ENCOUNTER — Other Ambulatory Visit: Payer: Self-pay

## 2020-05-24 VITALS — BP 145/82 | HR 93 | Temp 97.3°F | Resp 17 | Wt 196.0 lb

## 2020-05-24 DIAGNOSIS — R351 Nocturia: Secondary | ICD-10-CM

## 2020-05-24 DIAGNOSIS — I10 Essential (primary) hypertension: Secondary | ICD-10-CM | POA: Diagnosis not present

## 2020-05-24 DIAGNOSIS — Z1231 Encounter for screening mammogram for malignant neoplasm of breast: Secondary | ICD-10-CM | POA: Diagnosis not present

## 2020-05-24 DIAGNOSIS — G8929 Other chronic pain: Secondary | ICD-10-CM

## 2020-05-24 DIAGNOSIS — E785 Hyperlipidemia, unspecified: Secondary | ICD-10-CM | POA: Diagnosis not present

## 2020-05-24 DIAGNOSIS — E119 Type 2 diabetes mellitus without complications: Secondary | ICD-10-CM | POA: Diagnosis not present

## 2020-05-24 DIAGNOSIS — Z23 Encounter for immunization: Secondary | ICD-10-CM | POA: Diagnosis not present

## 2020-05-24 DIAGNOSIS — M25571 Pain in right ankle and joints of right foot: Secondary | ICD-10-CM | POA: Diagnosis not present

## 2020-05-24 DIAGNOSIS — E1169 Type 2 diabetes mellitus with other specified complication: Secondary | ICD-10-CM

## 2020-05-24 DIAGNOSIS — E039 Hypothyroidism, unspecified: Secondary | ICD-10-CM

## 2020-05-24 LAB — POCT GLYCOSYLATED HEMOGLOBIN (HGB A1C): HbA1c, POC (controlled diabetic range): 6 % (ref 0.0–7.0)

## 2020-05-24 MED ORDER — GLIPIZIDE ER 5 MG PO TB24: 5.0000 mg | ORAL_TABLET | Freq: Every day | ORAL | 3 refills | Status: DC

## 2020-05-24 NOTE — Progress Notes (Signed)
Subjective:    Laurie Michael - 69 y.o. female MRN [id number]  Date of birth: 15-Jan-1951  HPI  Laurie Michael is here for follow up of chronic medical conditions.  Chronic HTN Disease Monitoring:  Home BP Monitoring - Not consistently  Chest pain- no  Dyspnea- no Headache - no  Medications: Lisinopril 10 mg  Compliance- yes, although forgot to take this morning Lightheadedness- no  Edema- no    Diabetes mellitus, Type 2 Disease Monitoring             Blood Sugar Ranges: Fasting - Average 100-110s              Polyuria: no              Visual problems: no   Urine Microalbumin 10--on Ace Inhibitor   Last A1C: 6.5 (March 2021)   Medications: Glipizide 10 mg, Metformin 1000 mg BID   Medication Compliance: yes  Medication Side Effects             Hypoglycemia: yes, occasionally has CBGs in 60s    Health Maintenance:  Health Maintenance Due  Topic Date Due  . COVID-19 Vaccine (1) Never done  . FOOT EXAM  07/17/2018  . MAMMOGRAM  10/05/2019  . INFLUENZA VACCINE  03/14/2020  . OPHTHALMOLOGY EXAM  04/15/2020    -  reports that she quit smoking about 7 years ago. Her smoking use included cigarettes. She has a 54.00 pack-year smoking history. She has never used smokeless tobacco. - Review of Systems: Per HPI. - Past Medical History: Patient Active Problem List   Diagnosis Date Noted  . History of open sigmoidectomy 09/03/2018  . S/P colectomy 08/22/2018  . Polyarthralgia 05/08/2018  . History of colon polyps 03/28/2018  . Osteopenia 01/23/2018  . Estrogen deficiency 07/17/2017  . Hypothyroidism 01/30/2017  . Carpal tunnel syndrome, bilateral 01/30/2017  . Obesity 01/30/2017  . Dental calculus 08/25/2016  . Hyperlipidemia associated with type 2 diabetes mellitus ([place]) 05/02/2016  . Cataract extraction status 04/19/2016  . Essential hypertension 01/12/2016  . Type 2 diabetes mellitus ([place]) 01/12/2016   - Medications: reviewed and updated    Objective:   Physical Exam BP (!) 145/82   Pulse 93   Temp (!) 97.3 F (36.3 C) (Temporal)   Resp 17   Wt 196 lb (88.9 kg)   SpO2 95%   BMI 34.72 kg/m  Physical Exam Constitutional:      General: She is not in acute distress.    Appearance: She is not diaphoretic.  Cardiovascular:     Rate and Rhythm: Normal rate.  Pulmonary:     Effort: Pulmonary effort is normal. No respiratory distress.  Musculoskeletal:        General: Normal range of motion.     Comments: Right ankle with some mild lateral swelling. No erythema, increased warmth, or rashes. No point TTP. Full ROM. Strength preserved. Sensation intact. Able to weight bear with ambulation.   Skin:    General: Skin is warm and dry.  Neurological:     Mental Status: She is alert and oriented to person, place, and time.  Psychiatric:        Mood and Affect: Affect normal.        Judgment: Judgment normal.            Assessment & Plan:   1. Type 2 diabetes mellitus without complication, without long-term current use of insulin (HCC) A1c 6.0. Given age and hypoglycemic episodes, will  decrease Glipizide from 10 mg to 5 mg. Continue to monitor.  Counseled on Diabetic diet, my plate method, 770 minutes of moderate intensity exercise/week Blood sugar logs with fasting goals of 80-120 mg/dl, random of less than 180 and in the event of sugars less than 60 mg/dl or greater than 400 mg/dl encouraged to notify the clinic. Advised on the need for annual eye exams, annual foot exams, Pneumonia vaccine. - HgB A1c - Comprehensive metabolic panel - [facility] Diabetes Foot Exam - Ambulatory referral to Ophthalmology - glipiZIDE (GLUCOTROL XL) 5 MG 24 hr tablet; Take 1 tablet (5 mg total) by mouth daily.  Dispense: 90 tablet; Refill: 3  2. Essential hypertension BP near goal today and patient forgot to take BP medication prior to appointment. Asymptomatic. Continue to monitor.  - CBC with Differential - Comprehensive metabolic  panel  3. Hyperlipidemia associated with type 2 diabetes mellitus (HCC) Last LDL 43. Continue Lipitor.   4. Acquired hypothyroidism TSH in Sept 2020 was WNL with result of 1.19. Repeat to monitor.  - TSH  5. Breast cancer screening by mammogram - MM Digital Screening; Future  6. Needs flu shot - Flu Vaccine QUAD 6+ mos PF IM (Fluarix Quad PF)  7. Chronic pain of right ankle Exam benign. Encouraged ice, compression, rest, elevation.   8. Nocturia Encouraged fluid restriction prior to bedtime and limiting caffeine intake in the afternoon/evening. Will continue to monitor with lifestyle changes.     Phill Myron, D.O. 05/24/2020, 1:55 PM Primary Care at [facility]

## 2020-05-25 LAB — CBC WITH DIFFERENTIAL/PLATELET
Basophils Absolute: 0.1 10*3/uL (ref 0.0–0.2)
Basos: 1 %
EOS (ABSOLUTE): 0.2 10*3/uL (ref 0.0–0.4)
Eos: 2 %
Hematocrit: 39.6 % (ref 34.0–46.6)
Hemoglobin: 12.8 g/dL (ref 11.1–15.9)
Immature Grans (Abs): 0 10*3/uL (ref 0.0–0.1)
Immature Granulocytes: 0 %
Lymphocytes Absolute: 2.8 10*3/uL (ref 0.7–3.1)
Lymphs: 26 %
MCH: 28.3 pg (ref 26.6–33.0)
MCHC: 32.3 g/dL (ref 31.5–35.7)
MCV: 87 fL (ref 79–97)
Monocytes Absolute: 0.8 10*3/uL (ref 0.1–0.9)
Monocytes: 8 %
Neutrophils Absolute: 6.9 10*3/uL (ref 1.4–7.0)
Neutrophils: 63 %
Platelets: 406 10*3/uL (ref 150–450)
RBC: 4.53 x10E6/uL (ref 3.77–5.28)
RDW: 13.1 % (ref 11.7–15.4)
WBC: 10.7 10*3/uL (ref 3.4–10.8)

## 2020-05-25 LAB — COMPREHENSIVE METABOLIC PANEL
ALT: 19 IU/L (ref 0–32)
AST: 18 IU/L (ref 0–40)
Albumin/Globulin Ratio: 1.3 (ref 1.2–2.2)
Albumin: 4.1 g/dL (ref 3.8–4.8)
Alkaline Phosphatase: 235 IU/L — ABNORMAL HIGH (ref 44–121)
BUN/Creatinine Ratio: 31 — ABNORMAL HIGH (ref 12–28)
BUN: 19 mg/dL (ref 8–27)
Bilirubin Total: 0.3 mg/dL (ref 0.0–1.2)
CO2: 26 mmol/L (ref 20–29)
Calcium: 9.3 mg/dL (ref 8.7–10.3)
Chloride: 102 mmol/L (ref 96–106)
Creatinine, Ser: 0.61 mg/dL (ref 0.57–1.00)
GFR calc Af Amer: 108 mL/min/{1.73_m2} (ref >59)
GFR calc non Af Amer: 93 mL/min/{1.73_m2} (ref >59)
Globulin, Total: 3.2 g/dL (ref 1.5–4.5)
Glucose: 123 mg/dL — ABNORMAL HIGH (ref 65–99)
Potassium: 4.5 mmol/L (ref 3.5–5.2)
Sodium: 140 mmol/L (ref 134–144)
Total Protein: 7.3 g/dL (ref 6.0–8.5)

## 2020-05-25 LAB — TSH: TSH: 1.27 u[IU]/mL (ref 0.450–4.500)

## 2020-08-25 ENCOUNTER — Encounter: Payer: Self-pay | Admitting: Physician Assistant

## 2020-08-25 ENCOUNTER — Ambulatory Visit (INDEPENDENT_AMBULATORY_CARE_PROVIDER_SITE_OTHER): Payer: Medicare HMO | Admitting: Physician Assistant

## 2020-08-25 ENCOUNTER — Ambulatory Visit (INDEPENDENT_AMBULATORY_CARE_PROVIDER_SITE_OTHER): Payer: Medicare HMO

## 2020-08-25 ENCOUNTER — Other Ambulatory Visit: Payer: Self-pay

## 2020-08-25 VITALS — BP 130/83 | HR 78 | Temp 97.3°F | Resp 18 | Ht 64.0 in | Wt 199.0 lb

## 2020-08-25 DIAGNOSIS — Z23 Encounter for immunization: Secondary | ICD-10-CM | POA: Diagnosis not present

## 2020-08-25 DIAGNOSIS — M25571 Pain in right ankle and joints of right foot: Secondary | ICD-10-CM

## 2020-08-25 DIAGNOSIS — M79671 Pain in right foot: Secondary | ICD-10-CM

## 2020-08-25 MED ORDER — SHINGRIX 50 MCG/0.5ML IM SUSR: 0.5000 mL | Freq: Once | INTRAMUSCULAR | 0 refills | Status: AC

## 2020-08-25 NOTE — Progress Notes (Signed)
Patient complains of intermittent swelling in the right foot now across the ankle. Discomfort and swelling has been present intermittently for months.  Patient complains of pain increasing at night. Patient asked about shingrix vaccine being sent to local pharmacy for pricing and adminstration.

## 2020-08-25 NOTE — Progress Notes (Signed)
Established Patient Office Visit  Subjective:  Patient ID: Laurie Michael, female    DOB: 1951/05/09  Age: 70 y.o. MRN: [id number]  CC:  Chief Complaint  Patient presents with  . Joint Swelling    HPI Laurie Michael reports that she has been having right ankle and right foot pain and swelling on and off for the past year, states that it recently has become worse.  Reports that she will have some pain and discomfort when walking, states that it tends to hurt worse at night.  Describes pain as tight, aching.  Denies any injury or trauma, states she does not do any type of high impact exercise.  Reports that she recently purchased a brace but does endorse that she has not used it very much.  Does try to keep her feet elevated at night with some relief.  Request shingles vaccine.  Hurts sometimes when waling   Past Medical History:  Diagnosis Date  . Complication of anesthesia   . Diabetes mellitus without complication ([place])   . Diverticulosis 08/2018   partial colectomy  . Family history of adverse reaction to anesthesia    mom is slow to wake up  . Hypertension   . Hypothyroidism   . Irritable bowel   . Obesity   . PONV (postoperative nausea and vomiting)     Past Surgical History:  Procedure Laterality Date  . CATARACT EXTRACTION, BILATERAL  2017  . COLON SURGERY  08/2018   partial colectomy for diverticulosis  . COLONOSCOPY N/A 07/12/2016   Procedure: COLONOSCOPY;  Surgeon: Wonda Horner, MD;  Location[facility] ENDOSCOPY;  Service: Endoscopy;  Laterality: N/A;  . HYSTEROSCOPY W/ ENDOMETRIAL ABLATION    . SHOULDER ARTHROSCOPY WITH DISTAL CLAVICLE RESECTION Right 01/31/2019   Procedure: SHOULDER ARTHROSCOPY WITH DISTAL CLAVICLE RESECTION;  Surgeon: Earlie Server, MD;  Location: [facility];  Service: Orthopedics;  Laterality: Right;  . SHOULDER ARTHROSCOPY WITH SUBACROMIAL DECOMPRESSION Right 01/31/2019   Procedure: SHOULDER ARTHROSCOPY WITH SUBACROMIAL  DECOMPRESSION;  Surgeon: Earlie Server, MD;  Location: [facility];  Service: Orthopedics;  Laterality: Right;  . TUBAL LIGATION    . WISDOM TOOTH EXTRACTION      Family History  Problem Relation Age of Onset  . Diabetes Mother   . Breast cancer Mother   . Heart disease Father   . Cancer Brother   . Breast cancer Sister     Social History   Socioeconomic History  . Marital status: Married    Spouse name: Not on file  . Number of children: Not on file  . Years of education: Not on file  . Highest education level: Not on file  Occupational History  . Not on file  Tobacco Use  . Smoking status: Former Smoker    Packs/day: 2.00    Years: 27.00    Pack years: 54.00    Types: Cigarettes    Quit date: 01/10/2013    Years since quitting: 7.6  . Smokeless tobacco: Never Used  Vaping Use  . Vaping Use: Never used  Substance and Sexual Activity  . Alcohol use: No    Alcohol/week: 0.0 standard drinks  . Drug use: No  . Sexual activity: Yes    Birth control/protection: None, Post-menopausal  Other Topics Concern  . Not on file  Social History Narrative  . Not on file   Social Determinants of Health   Financial Resource Strain: Not on file  Food Insecurity: Not on file  Transportation Needs: Not on file  Physical Activity: Not on file  Stress: Not on file  Social Connections: Not on file  Intimate Partner Violence: Not on file    Outpatient Medications Prior to Visit  Medication Sig Dispense Refill  . Ascorbic Acid (VITAMIN C) 1000 MG tablet Take 1,000 mg by mouth daily.    Marland Kitchen atorvastatin (LIPITOR) 80 MG tablet Take 1 tablet (80 mg total) by mouth daily at 6 PM. 90 tablet 3  . Blood Glucose Monitoring Suppl (TRUE METRIX METER) w/Device KIT Use to check blood sugar once daily. Diagnosis code E11.9 1 kit 0  . calcium-vitamin D (OSCAL WITH D) 500-200 MG-UNIT tablet Take 2 tablets by mouth daily.    Marland Kitchen glipiZIDE (GLUCOTROL XL) 5 MG 24 hr tablet Take 1 tablet  (5 mg total) by mouth daily. 90 tablet 3  . glucose blood (TRUE METRIX BLOOD GLUCOSE TEST) test strip Use to check blood sugar once daily. Diagnosis code E11.9 100 each 2  . levothyroxine (EUTHYROX) 75 MCG tablet Take 1 tablet (75 mcg total) by mouth daily. 90 tablet 3  . lisinopril (ZESTRIL) 10 MG tablet Take 1 tablet (10 mg total) by mouth daily. 90 tablet 3  . magnesium 30 MG tablet Take 30 mg by mouth 2 (two) times daily.    . metFORMIN (GLUCOPHAGE) 1000 MG tablet Take 1 tablet (1,000 mg total) by mouth 2 (two) times daily with a meal. 180 tablet 3  . TRUEplus Lancets 30G MISC Use to check blood sugar once daily. Diagnosis code E11.9 100 each 2   No facility-administered medications prior to visit.    No Known Allergies  ROS Review of Systems  Constitutional: Negative for chills and fever.  HENT: Negative.   Eyes: Negative.   Respiratory: Negative.   Cardiovascular: Negative.   Gastrointestinal: Negative.   Endocrine: Negative.   Genitourinary: Negative.   Musculoskeletal: Positive for arthralgias, gait problem and joint swelling.  Skin: Negative for rash and wound.  Allergic/Immunologic: Negative.   Hematological: Negative.   Psychiatric/Behavioral: Negative.       Objective:    Physical Exam Constitutional:      General: She is not in acute distress.    Appearance: Normal appearance. She is not ill-appearing.  HENT:     Head: Normocephalic and atraumatic.     Right Ear: External ear normal.     Left Ear: External ear normal.     Nose: Nose normal.     Mouth/Throat:     Mouth: Mucous membranes are moist.     Pharynx: Oropharynx is clear.  Cardiovascular:     Rate and Rhythm: Normal rate.     Pulses: Normal pulses.          Dorsalis pedis pulses are 2+ on the right side and 2+ on the left side.       Posterior tibial pulses are 2+ on the right side and 2+ on the left side.     Heart sounds: Normal heart sounds.  Pulmonary:     Effort: Pulmonary effort is  normal.     Breath sounds: Normal breath sounds.  Musculoskeletal:     Cervical back: Normal range of motion and neck supple.     Right ankle: Swelling present. Tenderness present. Normal range of motion.     Right foot: Normal range of motion. Swelling and tenderness present.  Feet:     Right foot:     Skin integrity: Warmth present. No skin breakdown, erythema or  dry skin.  Skin:    General: Skin is warm and dry.  Neurological:     General: No focal deficit present.     Mental Status: She is alert and oriented to person, place, and time.  Psychiatric:        Mood and Affect: Mood normal.        Behavior: Behavior normal.        Thought Content: Thought content normal.        Judgment: Judgment normal.     BP 130/83 (BP Location: Right Arm, Patient Position: Sitting, Cuff Size: Large)   Pulse 78   Temp (!) 97.3 F (36.3 C) (Oral)   Resp 18   Ht _0  (1.626 m)   Wt 199 lb (90.3 kg)   SpO2 97%   BMI 34.16 kg/m  Wt Readings from Last 3 Encounters:  08/25/20 199 lb (90.3 kg)  05/24/20 196 lb (88.9 kg)  04/16/19 198 lb 12.8 oz (90.2 kg)     Health Maintenance Due  Topic Date Due  . MAMMOGRAM  10/05/2019  . OPHTHALMOLOGY EXAM  04/15/2020  . HEMOGLOBIN A1C  08/24/2020    There are no preventive care reminders to display for this patient.  Lab Results  Component Value Date   TSH 1.270 05/24/2020   Lab Results  Component Value Date   WBC 10.7 05/24/2020   HGB 12.8 05/24/2020   HCT 39.6 05/24/2020   MCV 87 05/24/2020   PLT 406 05/24/2020   Lab Results  Component Value Date   NA 140 05/24/2020   K 4.5 05/24/2020   CO2 26 05/24/2020   GLUCOSE 123 (H) 05/24/2020   BUN 19 05/24/2020   CREATININE 0.61 05/24/2020   BILITOT 0.3 05/24/2020   ALKPHOS 235 (H) 05/24/2020   AST 18 05/24/2020   ALT 19 05/24/2020   PROT 7.3 05/24/2020   ALBUMIN 4.1 05/24/2020   CALCIUM 9.3 05/24/2020   ANIONGAP 12 02/29/2016   Lab Results  Component Value Date   CHOL 136  01/13/2019   Lab Results  Component Value Date   HDL 51 01/13/2019   Lab Results  Component Value Date   LDLCALC 43 01/13/2019   Lab Results  Component Value Date   TRIG 209 (H) 01/13/2019   Lab Results  Component Value Date   CHOLHDL 2.7 01/13/2019   Lab Results  Component Value Date   HGBA1C 6.0 05/24/2020      Assessment & Plan:   Problem List Items Addressed This Visit   None   Visit Diagnoses    Right foot pain    -  Primary   Relevant Orders   DG Foot Complete Right   Acute right ankle pain       Relevant Orders   DG Ankle Complete Right   Need for shingles vaccine       Relevant Medications   Zoster Vaccine Adjuvanted [facility]) injection    1. Right foot pain Patient education given on RICE, encouraged supportive shoes. - DG Foot Complete Right  2. Acute right ankle pain  - DG Ankle Complete Right  3. Need for shingles vaccine Prescription sent to patient's pharmacy on her behalf - Zoster Vaccine Adjuvanted [facility]) injection; Inject 0.5 mLs into the muscle once for 1 dose.  Dispense: 0.5 mL; Refill: 0  Meds ordered this encounter  Medications  . Zoster Vaccine Adjuvanted [facility]) injection    Sig: Inject 0.5 mLs into the muscle once for 1 dose.  Dispense:  0.5 mL    Refill:  0    Order Specific Question:   Supervising Provider    Answer:   Asencion Noble E [[id number]]    I have reviewed the patient's medical history (PMH, PSH, Social History, Family History, Medications, and allergies) , and have been updated if relevant. I spent 30 minutes reviewing chart and  face to face time with patient.    Follow-up: Return if symptoms worsen or fail to improve.    Laurie Grip Lesle Faron, PA-C

## 2020-08-25 NOTE — Patient Instructions (Signed)
I sent the order for your shingles vaccine to [facility].  For your right foot and ankle pain, you will have x-rays completed today, we will call you with the result.  Based on the result, I will start a referral for you to be seen for further evaluation.  I encourage you to use the brace that she purchased, wear supportive shoes, keep foot elevated when possible, and use the ice packs.  Please let us know if there is anything else we can do for you  Kennieth Rad, PA-C Physician Assistant [place] [email or web address]    Ankle Pain The ankle joint holds your body weight and allows you to move around. Ankle pain can occur on either side or the back of one ankle or both ankles. Ankle pain may be sharp and burning or dull and aching. There may be tenderness, stiffness, redness, or warmth around the ankle. Many things can cause ankle pain, including an injury to the area and overuse of the ankle. Follow these instructions at home: Activity  Rest your ankle as told by your health care provider. Avoid any activities that cause ankle pain.  Do not use the injured limb to support your body weight until your health care provider says that you can. Use crutches as told by your health care provider.  Do exercises as told by your health care provider.  Ask your health care provider when it is safe to drive if you have a brace on your ankle. If you have a brace:  Wear the brace as told by your health care provider. Remove it only as told by your health care provider.  Loosen the brace if your toes tingle, become numb, or turn cold and blue.  Keep the brace clean.  If the brace is not waterproof: ? Do not let it get wet. ? Cover it with a watertight covering when you take a bath or shower. If you were given an elastic bandage:  Remove it when you take a bath or a shower.  Try not to move your ankle very much, but wiggle your toes from  time to time. This helps to prevent swelling.  Adjust the bandage to make it more comfortable if it feels too tight.  Loosen the bandage if you have numbness or tingling in your foot or if your foot turns cold and blue.   Managing pain, stiffness, and swelling  If directed, put ice on the painful area. ? If you have a removable brace or elastic bandage, remove it as told by your health care provider. ? Put ice in a plastic bag. ? Place a towel between your skin and the bag. ? Leave the ice on for 20 minutes, 2-3 times a day.  Move your toes often to avoid stiffness and to lessen swelling.  Raise (elevate) your ankle above the level of your heart while you are sitting or lying down.   General instructions  Record information about your pain. Writing down the following may be helpful for you and your health care provider: ? How often you have ankle pain. ? Where the pain is located. ? What the pain feels like.  If treatment involves wearing a prescribed shoe or insole, make sure you wear it correctly and for as long as told by your health care provider.  Take over-the-counter and prescription medicines only as told by your health care provider.  Keep all follow-up visits as told by your health care provider. This is important.  Contact a health care provider if:  Your pain gets worse.  Your pain is not relieved with medicines.  You have a fever or chills.  You are having more trouble with walking.  You have new symptoms. Get help right away if:  Your foot, leg, toes, or ankle: ? Tingles or becomes numb. ? Becomes swollen. ? Turns pale or blue. Summary  Ankle pain can occur on either side or the back of one ankle or both ankles.  Ankle pain may be sharp and burning or dull and aching.  Rest your ankle as told by your health care provider. If told, apply ice to the area.  Take over-the-counter and prescription medicines only as told by your health care provider. This  information is not intended to replace advice given to you by your health care provider. Make sure you discuss any questions you have with your health care provider. Document Revised: 11/19/2018 Document Reviewed: 02/06/2018 Elsevier Patient Education  [place] Pain Many things can cause foot pain. Some common causes are:  An injury.  A sprain.  Arthritis.  Blisters.  Bunions. Follow these instructions at home: Managing pain, stiffness, and swelling If directed, put ice on the painful area:  Put ice in a plastic bag.  Place a towel between your skin and the bag.  Leave the ice on for 20 minutes, 2-3 times a day.   Activity  Do not stand or walk for long periods.  Return to your normal activities as told by your health care provider. Ask your health care provider what activities are safe for you.  Do stretches to relieve foot pain and stiffness as told by your health care provider.  Do not lift anything that is heavier than 10 lb (4.5 kg), or the limit that you are told, until your health care provider says that it is safe. Lifting a lot of weight can put added pressure on your feet. Lifestyle  Wear comfortable, supportive shoes that fit you well. Do not wear high heels.  Keep your feet clean and dry. General instructions  Take over-the-counter and prescription medicines only as told by your health care provider.  Rub your foot gently.  Pay attention to any changes in your symptoms.  Keep all follow-up visits as told by your health care provider. This is important. Contact a health care provider if:  Your pain does not get better after a few days of self-care.  Your pain gets worse.  You cannot stand on your foot. Get help right away if:  Your foot is numb or tingling.  Your foot or toes are swollen.  Your foot or toes turn white or blue.  You have warmth and redness along your foot. Summary  Common causes of foot pain are injury,  sprain, arthritis, blisters or bunions.  Ice, medicines, and comfortable shoes may help foot pain.  Contact your health care provider if your pain does not get better after a few days of self-care. This information is not intended to replace advice given to you by your health care provider. Make sure you discuss any questions you have with your health care provider. Document Revised: 05/16/2018 Document Reviewed: 05/16/2018 Elsevier Patient Education  2021 [employer].

## 2020-08-26 ENCOUNTER — Telehealth: Payer: Self-pay | Admitting: *Deleted

## 2020-08-26 MED ORDER — PREDNISONE 10 MG PO TABS: ORAL_TABLET | ORAL | 0 refills | Status: AC

## 2020-08-26 NOTE — Telephone Encounter (Signed)
-----   Message from Kennieth Rad, [place] sent at 08/26/2020  9:16 AM EST ----- Please call patient and let her know that her foot and her ankle x-rays do not show any fracture or dislocation, it does show some tendinopathy and some mild arthritis. I am going to send a steroid taper for her to take and start a referral for her to be seen by a foot and ankle specialist.

## 2020-08-26 NOTE — Telephone Encounter (Signed)
MA LVM with husband for patient to return a phone call or review xray results via mychart. Patient has viewed via mychart.

## 2020-08-26 NOTE — Addendum Note (Signed)
Addended by: Kennieth Rad on: 08/26/2020 09:17 AM   Modules accepted: Orders

## 2020-08-27 ENCOUNTER — Telehealth: Payer: Self-pay | Admitting: Internal Medicine

## 2020-08-27 NOTE — Telephone Encounter (Signed)
Pt returning CMA Nubia's call regarding her results viewed on My Chart but would like to know the interpretation of those results so she understands fully. Please advise and thank you.

## 2020-08-29 ENCOUNTER — Other Ambulatory Visit: Payer: Self-pay | Admitting: Internal Medicine

## 2020-08-29 DIAGNOSIS — I10 Essential (primary) hypertension: Secondary | ICD-10-CM

## 2020-08-29 DIAGNOSIS — E119 Type 2 diabetes mellitus without complications: Secondary | ICD-10-CM

## 2020-08-29 DIAGNOSIS — E039 Hypothyroidism, unspecified: Secondary | ICD-10-CM

## 2020-08-30 NOTE — Telephone Encounter (Signed)
Patient verified DOB Patient is aware of no fracture being noted on xray. Patient is aware of mild arthritis and tendinopathy being noted and a referral to the foot specialist along with presdnisone being provided for treatment at this time. Patient has an appointment with specialist next Monday and will pick up medication.

## 2020-09-06 ENCOUNTER — Ambulatory Visit: Payer: Medicare HMO | Admitting: Podiatry

## 2020-09-08 ENCOUNTER — Ambulatory Visit (INDEPENDENT_AMBULATORY_CARE_PROVIDER_SITE_OTHER): Payer: Medicare HMO

## 2020-09-08 ENCOUNTER — Other Ambulatory Visit: Payer: Self-pay

## 2020-09-08 ENCOUNTER — Ambulatory Visit: Payer: Medicare HMO | Admitting: Podiatry

## 2020-09-08 DIAGNOSIS — M67471 Ganglion, right ankle and foot: Secondary | ICD-10-CM

## 2020-09-08 NOTE — Progress Notes (Signed)
   HPI: 70 y.o. female presenting today as a new patient for evaluation of the fluctuant lump to the lateral aspect of the right ankle. Patient states that is been present for over 1 year. She mentioned it to her PCP who did not address her condition and she states that she "blew it off". Patient presents today for further treatment and evaluation. She denies a history of injury to the right ankle. She states that now she is getting some pain throughout the middle of the foot.  Past Medical History:  Diagnosis Date  . Complication of anesthesia   . Diabetes mellitus without complication ([place])   . Diverticulosis 08/2018   partial colectomy  . Family history of adverse reaction to anesthesia    mom is slow to wake up  . Hypertension   . Hypothyroidism   . Irritable bowel   . Obesity   . PONV (postoperative nausea and vomiting)      Physical Exam: General: The patient is alert and oriented x3 in no acute distress.  Dermatology: Skin is warm, dry and supple bilateral lower extremities. Negative for open lesions or macerations.  Vascular: Palpable pedal pulses bilaterally. No edema or erythema noted. Capillary refill within normal limits.  Neurological: Epicritic and protective threshold grossly intact bilaterally.   Musculoskeletal Exam: Range of motion within normal limits to all pedal and ankle joints bilateral. Muscle strength 5/5 in all groups bilateral. There is a nonadherent fluctuant large lesion to the lateral aspect of the right ankle along the peroneal tendon sheath likely consistent with a large ganglion cyst. There is associated tenderness to palpation  Radiographic Exam:  X-rays taken 08/25/2020. Please see radiology report  Assessment: 1. Ganglion cyst right lateral ankle along the peroneal tendon sheath   Plan of Care:  1. Patient evaluated. X-Rays reviewed that were taken 08/25/2020.  2. I explained to the patient the nature and etiology of the ganglion cyst. Recommend  draining the cyst today. The patient agrees. The foot was prepped in aseptic manner and 2 mL of 2% lidocaine plain was utilized for anesthesia. Approximately 2 mL of viscous fluid was drained from the lesion. Unfortunately this was not all of the lesion. I was unable to drain the remaining portion of fluid. 3. Ace wrap provided. Recommend Ace wrap daily x1 week 4. Return to clinic in 1 week. If there is no significant improvement we may need to proceed with open drainage/excision of the ganglion cyst      Edrick Kins, DPM [facility]  Dr. Edrick Kins, DPM    [address], [place] [address]                Office [phone]  Fax [phone]

## 2020-09-15 ENCOUNTER — Ambulatory Visit: Payer: Medicare HMO | Admitting: Podiatry

## 2020-09-15 ENCOUNTER — Other Ambulatory Visit: Payer: Self-pay

## 2020-09-15 DIAGNOSIS — M67471 Ganglion, right ankle and foot: Secondary | ICD-10-CM | POA: Diagnosis not present

## 2020-09-15 MED ORDER — TRAMADOL HCL 50 MG PO TABS: 50.0000 mg | ORAL_TABLET | ORAL | 0 refills | Status: DC | PRN

## 2020-09-15 NOTE — Progress Notes (Signed)
   HPI: 70 y.o. female presenting today for follow-up evaluation of the fluctuant lump to the lateral aspect of the right ankle. Patient states that is been present for over 1 year. She mentioned it to her PCP who did not address her condition and she states that she "blew it off".   Last visit on 09/08/2020 the lesion was aspirated and a small amount of viscous ganglionic cyst fluid was aspirated.  Compressive dressing was applied.  She states that over the past week the swelling returned.  Although it is somewhat better it is not gone completely.  She presents for further treatment evaluation  Past Medical History:  Diagnosis Date  . Complication of anesthesia   . Diabetes mellitus without complication ([place])   . Diverticulosis 08/2018   partial colectomy  . Family history of adverse reaction to anesthesia    mom is slow to wake up  . Hypertension   . Hypothyroidism   . Irritable bowel   . Obesity   . PONV (postoperative nausea and vomiting)      Physical Exam: General: The patient is alert and oriented x3 in no acute distress.  Dermatology: Skin is warm, dry and supple bilateral lower extremities. Negative for open lesions or macerations.  Vascular: Palpable pedal pulses bilaterally. No edema or erythema noted. Capillary refill within normal limits.  Neurological: Epicritic and protective threshold grossly intact bilaterally.   Musculoskeletal Exam: Range of motion within normal limits to all pedal and ankle joints bilateral. Muscle strength 5/5 in all groups bilateral. There is a nonadherent fluctuant large lesion to the lateral aspect of the right ankle along the peroneal tendon sheath likely consistent with a large ganglion cyst. There is associated tenderness to palpation  Radiographic Exam:  X-rays taken 08/25/2020. Please see radiology report  Assessment: 1. Ganglion cyst right lateral ankle along the peroneal tendon sheath   Plan of Care:  1. Patient evaluated.  2.  Today we discussed the conservative versus surgical management of the presenting pathology. The patient opts for surgical management. All possible complications and details of the procedure were explained. All patient questions were answered. No guarantees were expressed or implied. 3. Authorization for surgery was initiated today. Surgery will consist of excision of ganglion cyst right lateral ankle.  4.  Return to clinic 1 week postop       Edrick Kins, DPM [facility]  Dr. Edrick Kins, DPM    [address], [place] [address]                Office [phone]  Fax [phone]

## 2020-10-18 ENCOUNTER — Telehealth: Payer: Self-pay

## 2020-10-18 NOTE — Telephone Encounter (Signed)
DOS -- 10/28/20  EXC GANGLION RT   The following codes do not require a pre-authorization Created on 10/18/2020 Service info 28090 Excision of lesion, tendon, tendon sheath, or capsule (including synovectomy) (eg, cyst or ganglion); foot

## 2020-10-19 ENCOUNTER — Telehealth: Payer: Self-pay | Admitting: Podiatry

## 2020-10-19 NOTE — Telephone Encounter (Signed)
Please advise 

## 2020-10-22 ENCOUNTER — Telehealth: Payer: Self-pay | Admitting: Podiatry

## 2020-10-22 NOTE — Telephone Encounter (Signed)
Pt called stating the pharmacy needs a response from you in order for her to receive a refund on her pain meds. Please advise.

## 2020-10-25 NOTE — Telephone Encounter (Signed)
Patient calling to request pain medication ( prior to surgery(has been discontinued). Patient has tried to get refill for several days, but stated that the pharmacy was waiting on approval from Dr. Amalia Hailey before they can fill it. Please advise.

## 2020-10-27 NOTE — Telephone Encounter (Signed)
Please advise 

## 2020-10-28 ENCOUNTER — Encounter: Payer: Self-pay | Admitting: Podiatry

## 2020-10-28 ENCOUNTER — Other Ambulatory Visit: Payer: Self-pay | Admitting: Podiatry

## 2020-10-28 DIAGNOSIS — M65871 Other synovitis and tenosynovitis, right ankle and foot: Secondary | ICD-10-CM | POA: Diagnosis not present

## 2020-10-28 DIAGNOSIS — M67471 Ganglion, right ankle and foot: Secondary | ICD-10-CM | POA: Diagnosis not present

## 2020-10-28 MED ORDER — OXYCODONE-ACETAMINOPHEN 5-325 MG PO TABS: 1.0000 | ORAL_TABLET | ORAL | 0 refills | Status: DC | PRN

## 2020-10-28 NOTE — Progress Notes (Signed)
PRN postop 

## 2020-11-03 ENCOUNTER — Other Ambulatory Visit: Payer: Self-pay

## 2020-11-03 ENCOUNTER — Encounter: Payer: Self-pay | Admitting: Podiatry

## 2020-11-03 ENCOUNTER — Ambulatory Visit (INDEPENDENT_AMBULATORY_CARE_PROVIDER_SITE_OTHER): Payer: Medicare HMO | Admitting: Podiatry

## 2020-11-03 DIAGNOSIS — M67471 Ganglion, right ankle and foot: Secondary | ICD-10-CM

## 2020-11-03 DIAGNOSIS — Z9889 Other specified postprocedural states: Secondary | ICD-10-CM

## 2020-11-03 NOTE — Progress Notes (Signed)
   Subjective:  Patient presents today status post excision of ganglion cyst right ankle. DOS: 10/28/2020.  Patient states that she is doing well.  She has been weightbearing in the surgical shoe as instructed.  She is also kept the dressings clean dry and intact.  Past Medical History:  Diagnosis Date  . Complication of anesthesia   . Diabetes mellitus without complication ([place])   . Diverticulosis 08/2018   partial colectomy  . Family history of adverse reaction to anesthesia    mom is slow to wake up  . Hypertension   . Hypothyroidism   . Irritable bowel   . Obesity   . PONV (postoperative nausea and vomiting)       Objective/Physical Exam Neurovascular status intact.  Skin incisions appear to be well coapted with staples intact. No sign of infectious process noted. No dehiscence. No active bleeding noted. Moderate edema noted to the surgical extremity.  Assessment: 1. s/p excision of ganglion cyst right ankle. DOS: 10/28/2020   Plan of Care:  1. Patient was evaluated.  2.  Dressings changed today.  Patient may begin washing and showering and getting the foot wet and changing her dressings 3.  Recommend antibiotic ointment and a large Band-Aid with a compression Ace wrap daily 4.  Continue postsurgical shoe 5.  Return to clinic in 1 week   Edrick Kins, DPM [facility]  Dr. Edrick Kins, DPM    [address], [place] [address]                Office [phone]  Fax [phone]

## 2020-11-09 ENCOUNTER — Encounter: Payer: Self-pay | Admitting: Internal Medicine

## 2020-11-10 ENCOUNTER — Ambulatory Visit (INDEPENDENT_AMBULATORY_CARE_PROVIDER_SITE_OTHER): Payer: Medicare HMO | Admitting: Podiatry

## 2020-11-10 ENCOUNTER — Other Ambulatory Visit: Payer: Self-pay

## 2020-11-10 DIAGNOSIS — Z9889 Other specified postprocedural states: Secondary | ICD-10-CM

## 2020-11-10 DIAGNOSIS — M67471 Ganglion, right ankle and foot: Secondary | ICD-10-CM

## 2020-11-10 NOTE — Progress Notes (Signed)
   Subjective:  Patient presents today status post excision of ganglion cyst right ankle. DOS: 10/28/2020.  Patient states that she is doing well.  She has been weightbearing in the surgical shoe as instructed.  She has been wearing an Ace wrap daily as instructed Past Medical History:  Diagnosis Date  . Complication of anesthesia   . Diabetes mellitus without complication ([place])   . Diverticulosis 08/2018   partial colectomy  . Family history of adverse reaction to anesthesia    mom is slow to wake up  . Hypertension   . Hypothyroidism   . Irritable bowel   . Obesity   . PONV (postoperative nausea and vomiting)       Objective/Physical Exam Neurovascular status intact.  Skin incisions appear to be well coapted with staples intact. No sign of infectious process noted. No dehiscence. No active bleeding noted. Moderate edema noted to the surgical extremity.  Assessment: 1. s/p excision of ganglion cyst right ankle. DOS: 10/28/2020   Plan of Care:  1. Patient was evaluated.  2.  Staples removed today 3.  Patient may begin washing and showering and getting the foot wet 4.  Compression ankle sleeve dispensed.  Wear daily 5.  Patient may discontinue postsurgical shoe.  Recommend good supportive sneakers 6.  Return to clinic in 4 weeks  Edrick Kins, DPM [facility]  Dr. Edrick Kins, DPM    [address], [place] [address]                Office [phone]  Fax [phone]

## 2020-11-12 ENCOUNTER — Other Ambulatory Visit: Payer: Self-pay | Admitting: Internal Medicine

## 2020-11-12 DIAGNOSIS — E119 Type 2 diabetes mellitus without complications: Secondary | ICD-10-CM

## 2020-11-12 DIAGNOSIS — E785 Hyperlipidemia, unspecified: Secondary | ICD-10-CM

## 2020-11-12 DIAGNOSIS — E1169 Type 2 diabetes mellitus with other specified complication: Secondary | ICD-10-CM

## 2020-11-22 ENCOUNTER — Encounter: Payer: Medicare HMO | Admitting: Podiatry

## 2020-11-23 ENCOUNTER — Ambulatory Visit: Payer: PPO | Admitting: Internal Medicine

## 2020-11-26 ENCOUNTER — Other Ambulatory Visit: Payer: Self-pay | Admitting: Internal Medicine

## 2020-11-26 DIAGNOSIS — E1169 Type 2 diabetes mellitus with other specified complication: Secondary | ICD-10-CM

## 2020-12-01 ENCOUNTER — Other Ambulatory Visit: Payer: Self-pay | Admitting: Internal Medicine

## 2020-12-06 ENCOUNTER — Telehealth: Payer: Self-pay | Admitting: Internal Medicine

## 2020-12-06 NOTE — Telephone Encounter (Signed)
Pt states she only has 2 atorvastatin (LIPITOR) 80 MG tablet pills left. Pt would like a call back to speak with nurse or Provider. Pt is not sure what she should do since it usually takes 5-7 days to get her medication from [facility].    [facility] - [address], [address]  [place], [place] [address]  Phone:  [phone] Fax:  [phone]

## 2020-12-07 ENCOUNTER — Encounter: Payer: Self-pay | Admitting: Nurse Practitioner

## 2020-12-07 ENCOUNTER — Ambulatory Visit (INDEPENDENT_AMBULATORY_CARE_PROVIDER_SITE_OTHER): Payer: Medicare HMO | Admitting: Nurse Practitioner

## 2020-12-07 ENCOUNTER — Other Ambulatory Visit: Payer: Self-pay

## 2020-12-07 VITALS — BP 136/84 | HR 99 | Resp 16 | Wt 191.2 lb

## 2020-12-07 DIAGNOSIS — E785 Hyperlipidemia, unspecified: Secondary | ICD-10-CM | POA: Diagnosis not present

## 2020-12-07 DIAGNOSIS — E1169 Type 2 diabetes mellitus with other specified complication: Secondary | ICD-10-CM | POA: Diagnosis not present

## 2020-12-07 DIAGNOSIS — E119 Type 2 diabetes mellitus without complications: Secondary | ICD-10-CM | POA: Diagnosis not present

## 2020-12-07 LAB — GLUCOSE, POCT (MANUAL RESULT ENTRY): POC Glucose: 134 mg/dl — AB (ref 70–99)

## 2020-12-07 MED ORDER — ATORVASTATIN CALCIUM 80 MG PO TABS: 80.0000 mg | ORAL_TABLET | Freq: Every day | ORAL | 1 refills | Status: DC

## 2020-12-07 MED ORDER — ATORVASTATIN CALCIUM 80 MG PO TABS: 80.0000 mg | ORAL_TABLET | Freq: Every day | ORAL | 0 refills | Status: DC

## 2020-12-07 NOTE — Telephone Encounter (Signed)
Last A1C was in October, 2021.  DM f/u protocol is every 3 mos.  Patient needs an OV to f/u with A1C and labs for lipid.  Left message on voicemail to return call, advising to schedule an appt.

## 2020-12-07 NOTE — Progress Notes (Signed)
'@Patient'  ID: Laurie Michael, female    DOB: [birth year], 70 y.o.   MRN: [id number]  Chief Complaint  Patient presents with  . Hypertension  . Diabetes  . Hyperlipidemia    Referring provider: Caryl Never*   HPI  Laurie Michael is here for follow up of chronic medical conditions.  Chronic HTN Disease Monitoring:  Home BP Monitoring - Not consistently  Chest pain- no  Dyspnea- no Headache - no  Medications: Lisinopril 10 mg  Compliance- yes, although forgot to take this morning Lightheadedness- no  Edema- no    Diabetes mellitus, Type 2 Disease Monitoring Blood Sugar Ranges: Fasting - Average 100-110s  Polyuria: no  Visual problems: no              Urine Microalbumin 10--on Ace Inhibitor              Last A1C: 6.5 (March 2021)   Medications: Glipizide 5 mg, Metformin 1000 mg BID   Medication Compliance: yes  Medication Side Effects Hypoglycemia: yes, occasionally has CBGs in 60s      No Known Allergies  Immunization History  Administered Date(s) Administered  . Influenza,inj,Quad PF,6+ Mos 05/02/2016, 07/17/2017, 05/08/2018, 04/16/2019, 05/24/2020  . Janssen (J&J) SARS-COV-2 Vaccination 10/27/2019  . PFIZER(Purple Top)SARS-COV-2 Vaccination 05/28/2020  . Pneumococcal Conjugate-13 07/17/2017  . Pneumococcal Polysaccharide-23 05/02/2016  . Tdap 01/13/2019    Past Medical History:  Diagnosis Date  . Complication of anesthesia   . Diabetes mellitus without complication ([place])   . Diverticulosis 08/2018   partial colectomy  . Family history of adverse reaction to anesthesia    mom is slow to wake up  . Hypertension   . Hypothyroidism   . Irritable bowel   . Obesity   . PONV (postoperative nausea and vomiting)     Tobacco History: Social History   Tobacco Use  Smoking Status Former Smoker  . Packs/day: 2.00  . Years: 27.00  . Pack years: 54.00  . Types: Cigarettes  .  Quit date: 01/10/2013  . Years since quitting: 7.9  Smokeless Tobacco Never Used   Counseling given: Yes   Outpatient Encounter Medications as of 12/07/2020  Medication Sig  . Ascorbic Acid (VITAMIN C) 1000 MG tablet Take 1,000 mg by mouth daily.  Marland Kitchen atorvastatin (LIPITOR) 80 MG tablet Take 1 tablet (80 mg total) by mouth daily at 6 PM.  . Blood Glucose Monitoring Suppl (TRUE METRIX METER) w/Device KIT USE AS DIRECTED  . calcium-vitamin D (OSCAL WITH D) 500-200 MG-UNIT tablet Take 2 tablets by mouth daily.  Marland Kitchen glipiZIDE (GLUCOTROL XL) 5 MG 24 hr tablet Take 1 tablet (5 mg total) by mouth daily.  Marland Kitchen glucose blood (TRUE METRIX BLOOD GLUCOSE TEST) test strip Use to check blood sugar once daily. Diagnosis code E11.9  . levothyroxine (SYNTHROID) 75 MCG tablet TAKE 1 TABLET EVERY DAY  . lisinopril (ZESTRIL) 10 MG tablet TAKE 1 TABLET EVERY DAY  . magnesium 30 MG tablet Take 30 mg by mouth 2 (two) times daily.  . metFORMIN (GLUCOPHAGE) 1000 MG tablet Take 1 tablet (1,000 mg total) by mouth 2 (two) times daily with a meal.  . oxyCODONE-acetaminophen (PERCOCET) 5-325 MG tablet Take 1 tablet by mouth every 4 (four) hours as needed for severe pain. (Patient not taking: Reported on 12/07/2020)  . traMADol (ULTRAM) 50 MG tablet TAKE 1 TABLET BY MOUTH EVERY 4 HOURS AS NEEDED FOR UP TO 5 DAYS (Patient not taking: Reported on 12/07/2020)  . TRUEplus Lancets 30G  MISC Use to check blood sugar once daily. Diagnosis code E11.9  . [DISCONTINUED] atorvastatin (LIPITOR) 80 MG tablet Take 1 tablet (80 mg total) by mouth daily at 6 PM.  . [DISCONTINUED] atorvastatin (LIPITOR) 80 MG tablet Take 1 tablet (80 mg total) by mouth daily at 6 PM.  . [DISCONTINUED] atorvastatin (LIPITOR) 80 MG tablet Take 1 tablet (80 mg total) by mouth daily at 6 PM.   No facility-administered encounter medications on file as of 12/07/2020.     Review of Systems  Review of Systems  Constitutional: Negative for fatigue.  HENT: Negative.    Respiratory: Negative for cough and shortness of breath.   Cardiovascular: Negative.  Negative for chest pain, palpitations and leg swelling.  Gastrointestinal: Negative.   Allergic/Immunologic: Negative.   Neurological: Negative.   Psychiatric/Behavioral: Negative.        Physical Exam  BP 136/84   Pulse 99   Resp 16   Wt 191 lb 3.2 oz (86.7 kg)   SpO2 96%   BMI 32.82 kg/m   Wt Readings from Last 5 Encounters:  12/07/20 191 lb 3.2 oz (86.7 kg)  08/25/20 199 lb (90.3 kg)  05/24/20 196 lb (88.9 kg)  04/16/19 198 lb 12.8 oz (90.2 kg)  01/31/19 194 lb 14.2 oz (88.4 kg)     Physical Exam Vitals and nursing note reviewed.  Constitutional:      General: She is not in acute distress.    Appearance: She is well-developed.  Cardiovascular:     Rate and Rhythm: Normal rate and regular rhythm.  Pulmonary:     Effort: Pulmonary effort is normal.     Breath sounds: Normal breath sounds.  Musculoskeletal:     Right lower leg: No edema.     Left lower leg: No edema.  Neurological:     Mental Status: She is alert and oriented to person, place, and time.        Assessment & Plan:   Type 2 diabetes mellitus (HCC) A1c 6.5 at last check. Continue Glipizide 5 mg. Continue to monitor.  Counseled on Diabetic diet, my plate method, 448 minutes of moderate intensity exercise/week Blood sugar logs with fasting goals of 80-120 mg/dl, random of less than 180 and in the event of sugars less than 60 mg/dl or greater than 400 mg/dl encouraged to notify the clinic. Advised on the need for annual eye exams, annual foot exams - HgB A1c - Comprehensive metabolic panel - [facility] Diabetes Foot Exam - Ambulatory referral to Ophthalmology placed at last visit   2. Essential hypertension BP near goal today and patient forgot to take BP medication prior to appointment. Asymptomatic. Continue to monitor.  - CBC with Differential - Comprehensive metabolic panel  3. Hyperlipidemia associated  with type 2 diabetes mellitus (HCC) Last LDL 43. Continue Lipitor.   Follow up:  Follow up with Dr. Juleen China in 3 months or sooner if needed     Patient Instructions   1. Type 2 diabetes mellitus without complication, without long-term current use of insulin (HCC) A1c 6.5 at last check. Continue Glipizide 5 mg. Continue to monitor.  Counseled on Diabetic diet, my plate method, 185 minutes of moderate intensity exercise/week Blood sugar logs with fasting goals of 80-120 mg/dl, random of less than 180 and in the event of sugars less than 60 mg/dl or greater than 400 mg/dl encouraged to notify the clinic. Advised on the need for annual eye exams, annual foot exams - HgB A1c - Comprehensive metabolic panel -  [facility] Diabetes Foot Exam - Ambulatory referral to Ophthalmology placed at last visit   2. Essential hypertension BP near goal today and patient forgot to take BP medication prior to appointment. Asymptomatic. Continue to monitor.  - CBC with Differential - Comprehensive metabolic panel  3. Hyperlipidemia associated with type 2 diabetes mellitus (HCC) Last LDL 43. Continue Lipitor.   Follow up:  Follow up with Dr. Juleen China in 3 months or sooner if needed   Diabetes Mellitus and Nutrition, Adult When you have diabetes, or diabetes mellitus, it is very important to have healthy eating habits because your blood sugar (glucose) levels are greatly affected by what you eat and drink. Eating healthy foods in the right amounts, at about the same times every day, can help you:  Control your blood glucose.  Lower your risk of heart disease.  Improve your blood pressure.  Reach or maintain a healthy weight. What can affect my meal plan? Every person with diabetes is different, and each person has different needs for a meal plan. Your health care provider may recommend that you work with a dietitian to make a meal plan that is best for you. Your meal plan may vary depending on  factors such as:  The calories you need.  The medicines you take.  Your weight.  Your blood glucose, blood pressure, and cholesterol levels.  Your activity level.  Other health conditions you have, such as heart or kidney disease. How do carbohydrates affect me? Carbohydrates, also called carbs, affect your blood glucose level more than any other type of food. Eating carbs naturally raises the amount of glucose in your blood. Carb counting is a method for keeping track of how many carbs you eat. Counting carbs is important to keep your blood glucose at a healthy level, especially if you use insulin or take certain oral diabetes medicines. It is important to know how many carbs you can safely have in each meal. This is different for every person. Your dietitian can help you calculate how many carbs you should have at each meal and for each snack. How does alcohol affect me? Alcohol can cause a sudden decrease in blood glucose (hypoglycemia), especially if you use insulin or take certain oral diabetes medicines. Hypoglycemia can be a life-threatening condition. Symptoms of hypoglycemia, such as sleepiness, dizziness, and confusion, are similar to symptoms of having too much alcohol.  Do not drink alcohol if: ? Your health care provider tells you not to drink. ? You are pregnant, may be pregnant, or are planning to become pregnant.  If you drink alcohol: ? Do not drink on an empty stomach. ? Limit how much you use to:  0-1 drink a day for women.  0-2 drinks a day for men. ? Be aware of how much alcohol is in your drink. In the [place] one drink equals one 12 oz bottle of beer (355 mL), one 5 oz glass of wine (148 mL), or one 1 oz glass of hard liquor (44 mL). ? Keep yourself hydrated with water, diet soda, or unsweetened iced tea.  Keep in mind that regular soda, juice, and other mixers may contain a lot of sugar and must be counted as carbs. What are tips for following this  plan? Reading food labels  Start by checking the serving size on the "Nutrition Facts" label of packaged foods and drinks. The amount of calories, carbs, fats, and other nutrients listed on the label is based on one serving of the item. Many  items contain more than one serving per package.  Check the total grams (g) of carbs in one serving. You can calculate the number of servings of carbs in one serving by dividing the total carbs by 15. For example, if a food has 30 g of total carbs per serving, it would be equal to 2 servings of carbs.  Check the number of grams (g) of saturated fats and trans fats in one serving. Choose foods that have a low amount or none of these fats.  Check the number of milligrams (mg) of salt (sodium) in one serving. Most people should limit total sodium intake to less than 2,300 mg per day.  Always check the nutrition information of foods labeled as "low-fat" or "nonfat." These foods may be higher in added sugar or refined carbs and should be avoided.  Talk to your dietitian to identify your daily goals for nutrients listed on the label. Shopping  Avoid buying canned, pre-made, or processed foods. These foods tend to be high in fat, sodium, and added sugar.  Shop around the outside edge of the grocery store. This is where you will most often find fresh fruits and vegetables, bulk grains, fresh meats, and fresh dairy. Cooking  Use low-heat cooking methods, such as baking, instead of high-heat cooking methods like deep frying.  Cook using healthy oils, such as olive, canola, or sunflower oil.  Avoid cooking with butter, cream, or high-fat meats. Meal planning  Eat meals and snacks regularly, preferably at the same times every day. Avoid going long periods of time without eating.  Eat foods that are high in fiber, such as fresh fruits, vegetables, beans, and whole grains. Talk with your dietitian about how many servings of carbs you can eat at each meal.  Eat  4-6 oz (112-168 g) of lean protein each day, such as lean meat, chicken, fish, eggs, or tofu. One ounce (oz) of lean protein is equal to: ? 1 oz (28 g) of meat, chicken, or fish. ? 1 egg. ?  cup (62 g) of tofu.  Eat some foods each day that contain healthy fats, such as avocado, nuts, seeds, and fish.   What foods should I eat? Fruits Berries. Apples. Oranges. Peaches. Apricots. Plums. Grapes. Mango. Papaya. Pomegranate. Kiwi. Cherries. Vegetables Lettuce. Spinach. Leafy greens, including kale, chard, collard greens, and mustard greens. Beets. Cauliflower. Cabbage. Broccoli. Carrots. Green beans. Tomatoes. Peppers. Onions. Cucumbers. Brussels sprouts. Grains Whole grains, such as whole-wheat or whole-grain bread, crackers, tortillas, cereal, and pasta. Unsweetened oatmeal. Quinoa. Brown or wild rice. Meats and other proteins Seafood. Poultry without skin. Lean cuts of poultry and beef. Tofu. Nuts. Seeds. Dairy Low-fat or fat-free dairy products such as milk, yogurt, and cheese. The items listed above may not be a complete list of foods and beverages you can eat. Contact a dietitian for more information. What foods should I avoid? Fruits Fruits canned with syrup. Vegetables Canned vegetables. Frozen vegetables with butter or cream sauce. Grains Refined white flour and flour products such as bread, pasta, snack foods, and cereals. Avoid all processed foods. Meats and other proteins Fatty cuts of meat. Poultry with skin. Breaded or fried meats. Processed meat. Avoid saturated fats. Dairy Full-fat yogurt, cheese, or milk. Beverages Sweetened drinks, such as soda or iced tea. The items listed above may not be a complete list of foods and beverages you should avoid. Contact a dietitian for more information. Questions to ask a health care provider  Do I need to meet with  a diabetes educator?  Do I need to meet with a dietitian?  What number can I call if I have questions?  When are  the best times to check my blood glucose? Where to find more information:  American Diabetes Association: diabetes.org  Academy of Nutrition and Dietetics: www.eatright.[employer] of Diabetes and Digestive and Kidney Diseases: [employer]  Association of Diabetes Care and Education Specialists: www.diabeteseducator.org Summary  It is important to have healthy eating habits because your blood sugar (glucose) levels are greatly affected by what you eat and drink.  A healthy meal plan will help you control your blood glucose and maintain a healthy lifestyle.  Your health care provider may recommend that you work with a dietitian to make a meal plan that is best for you.  Keep in mind that carbohydrates (carbs) and alcohol have immediate effects on your blood glucose levels. It is important to count carbs and to use alcohol carefully. This information is not intended to replace advice given to you by your health care provider. Make sure you discuss any questions you have with your health care provider. Document Revised: 07/08/2019 Document Reviewed: 07/08/2019 Elsevier Patient Education  2021 [place], [place] 12/07/2020

## 2020-12-07 NOTE — Patient Instructions (Addendum)
1. Type 2 diabetes mellitus without complication, without long-term current use of insulin (HCC) A1c 6.5 at last check. Continue Glipizide 5 mg. Continue to monitor.  Counseled on Diabetic diet, my plate method, 694 minutes of moderate intensity exercise/week Blood sugar logs with fasting goals of 80-120 mg/dl, random of less than 180 and in the event of sugars less than 60 mg/dl or greater than 400 mg/dl encouraged to notify the clinic. Advised on the need for annual eye exams, annual foot exams - HgB A1c - Comprehensive metabolic panel - [facility] Diabetes Foot Exam - Ambulatory referral to Ophthalmology placed at last visit   2. Essential hypertension BP near goal today and patient forgot to take BP medication prior to appointment. Asymptomatic. Continue to monitor.  - CBC with Differential - Comprehensive metabolic panel  3. Hyperlipidemia associated with type 2 diabetes mellitus (HCC) Last LDL 43. Continue Lipitor.   Follow up:  Follow up with Dr. Juleen China in 3 months or sooner if needed   Diabetes Mellitus and Nutrition, Adult When you have diabetes, or diabetes mellitus, it is very important to have healthy eating habits because your blood sugar (glucose) levels are greatly affected by what you eat and drink. Eating healthy foods in the right amounts, at about the same times every day, can help you:  Control your blood glucose.  Lower your risk of heart disease.  Improve your blood pressure.  Reach or maintain a healthy weight. What can affect my meal plan? Every person with diabetes is different, and each person has different needs for a meal plan. Your health care provider may recommend that you work with a dietitian to make a meal plan that is best for you. Your meal plan may vary depending on factors such as:  The calories you need.  The medicines you take.  Your weight.  Your blood glucose, blood pressure, and cholesterol levels.  Your activity level.  Other  health conditions you have, such as heart or kidney disease. How do carbohydrates affect me? Carbohydrates, also called carbs, affect your blood glucose level more than any other type of food. Eating carbs naturally raises the amount of glucose in your blood. Carb counting is a method for keeping track of how many carbs you eat. Counting carbs is important to keep your blood glucose at a healthy level, especially if you use insulin or take certain oral diabetes medicines. It is important to know how many carbs you can safely have in each meal. This is different for every person. Your dietitian can help you calculate how many carbs you should have at each meal and for each snack. How does alcohol affect me? Alcohol can cause a sudden decrease in blood glucose (hypoglycemia), especially if you use insulin or take certain oral diabetes medicines. Hypoglycemia can be a life-threatening condition. Symptoms of hypoglycemia, such as sleepiness, dizziness, and confusion, are similar to symptoms of having too much alcohol.  Do not drink alcohol if: ? Your health care provider tells you not to drink. ? You are pregnant, may be pregnant, or are planning to become pregnant.  If you drink alcohol: ? Do not drink on an empty stomach. ? Limit how much you use to:  0-1 drink a day for women.  0-2 drinks a day for men. ? Be aware of how much alcohol is in your drink. In the [place] one drink equals one 12 oz bottle of beer (355 mL), one 5 oz glass of wine (148 mL), or one  1 oz glass of hard liquor (44 mL). ? Keep yourself hydrated with water, diet soda, or unsweetened iced tea.  Keep in mind that regular soda, juice, and other mixers may contain a lot of sugar and must be counted as carbs. What are tips for following this plan? Reading food labels  Start by checking the serving size on the "Nutrition Facts" label of packaged foods and drinks. The amount of calories, carbs, fats, and other nutrients listed on  the label is based on one serving of the item. Many items contain more than one serving per package.  Check the total grams (g) of carbs in one serving. You can calculate the number of servings of carbs in one serving by dividing the total carbs by 15. For example, if a food has 30 g of total carbs per serving, it would be equal to 2 servings of carbs.  Check the number of grams (g) of saturated fats and trans fats in one serving. Choose foods that have a low amount or none of these fats.  Check the number of milligrams (mg) of salt (sodium) in one serving. Most people should limit total sodium intake to less than 2,300 mg per day.  Always check the nutrition information of foods labeled as "low-fat" or "nonfat." These foods may be higher in added sugar or refined carbs and should be avoided.  Talk to your dietitian to identify your daily goals for nutrients listed on the label. Shopping  Avoid buying canned, pre-made, or processed foods. These foods tend to be high in fat, sodium, and added sugar.  Shop around the outside edge of the grocery store. This is where you will most often find fresh fruits and vegetables, bulk grains, fresh meats, and fresh dairy. Cooking  Use low-heat cooking methods, such as baking, instead of high-heat cooking methods like deep frying.  Cook using healthy oils, such as olive, canola, or sunflower oil.  Avoid cooking with butter, cream, or high-fat meats. Meal planning  Eat meals and snacks regularly, preferably at the same times every day. Avoid going long periods of time without eating.  Eat foods that are high in fiber, such as fresh fruits, vegetables, beans, and whole grains. Talk with your dietitian about how many servings of carbs you can eat at each meal.  Eat 4-6 oz (112-168 g) of lean protein each day, such as lean meat, chicken, fish, eggs, or tofu. One ounce (oz) of lean protein is equal to: ? 1 oz (28 g) of meat, chicken, or fish. ? 1  egg. ?  cup (62 g) of tofu.  Eat some foods each day that contain healthy fats, such as avocado, nuts, seeds, and fish.   What foods should I eat? Fruits Berries. Apples. Oranges. Peaches. Apricots. Plums. Grapes. Mango. Papaya. Pomegranate. Kiwi. Cherries. Vegetables Lettuce. Spinach. Leafy greens, including kale, chard, collard greens, and mustard greens. Beets. Cauliflower. Cabbage. Broccoli. Carrots. Green beans. Tomatoes. Peppers. Onions. Cucumbers. Brussels sprouts. Grains Whole grains, such as whole-wheat or whole-grain bread, crackers, tortillas, cereal, and pasta. Unsweetened oatmeal. Quinoa. Brown or wild rice. Meats and other proteins Seafood. Poultry without skin. Lean cuts of poultry and beef. Tofu. Nuts. Seeds. Dairy Low-fat or fat-free dairy products such as milk, yogurt, and cheese. The items listed above may not be a complete list of foods and beverages you can eat. Contact a dietitian for more information. What foods should I avoid? Fruits Fruits canned with syrup. Vegetables Canned vegetables. Frozen vegetables with butter or  cream sauce. Grains Refined white flour and flour products such as bread, pasta, snack foods, and cereals. Avoid all processed foods. Meats and other proteins Fatty cuts of meat. Poultry with skin. Breaded or fried meats. Processed meat. Avoid saturated fats. Dairy Full-fat yogurt, cheese, or milk. Beverages Sweetened drinks, such as soda or iced tea. The items listed above may not be a complete list of foods and beverages you should avoid. Contact a dietitian for more information. Questions to ask a health care provider  Do I need to meet with a diabetes educator?  Do I need to meet with a dietitian?  What number can I call if I have questions?  When are the best times to check my blood glucose? Where to find more information:  American Diabetes Association: diabetes.org  Academy of Nutrition and Dietetics:  www.eatright.[employer] of Diabetes and Digestive and Kidney Diseases: [employer]  Association of Diabetes Care and Education Specialists: www.diabeteseducator.org Summary  It is important to have healthy eating habits because your blood sugar (glucose) levels are greatly affected by what you eat and drink.  A healthy meal plan will help you control your blood glucose and maintain a healthy lifestyle.  Your health care provider may recommend that you work with a dietitian to make a meal plan that is best for you.  Keep in mind that carbohydrates (carbs) and alcohol have immediate effects on your blood glucose levels. It is important to count carbs and to use alcohol carefully. This information is not intended to replace advice given to you by your health care provider. Make sure you discuss any questions you have with your health care provider. Document Revised: 07/08/2019 Document Reviewed: 07/08/2019 Elsevier Patient Education  2021 [employer].

## 2020-12-07 NOTE — Assessment & Plan Note (Signed)
A1c 6.5 at last check. Continue Glipizide 5 mg. Continue to monitor.  Counseled on Diabetic diet, my plate method, 209 minutes of moderate intensity exercise/week Blood sugar logs with fasting goals of 80-120 mg/dl, random of less than 180 and in the event of sugars less than 60 mg/dl or greater than 400 mg/dl encouraged to notify the clinic. Advised on the need for annual eye exams, annual foot exams - HgB A1c - Comprehensive metabolic panel - HM Diabetes Foot Exam - Ambulatory referral to Ophthalmology placed at last visit   2. Essential hypertension BP near goal today and patient forgot to take BP medication prior to appointment. Asymptomatic. Continue to monitor.  - CBC with Differential - Comprehensive metabolic panel  3. Hyperlipidemia associated with type 2 diabetes mellitus (HCC) Last LDL 43. Continue Lipitor.   Follow up:  Follow up with Dr. Juleen China in 3 months or sooner if needed

## 2020-12-08 ENCOUNTER — Other Ambulatory Visit: Payer: Medicare HMO

## 2020-12-08 LAB — COMPREHENSIVE METABOLIC PANEL
ALT: 18 IU/L (ref 0–32)
AST: 16 IU/L (ref 0–40)
Albumin/Globulin Ratio: 1.3 (ref 1.2–2.2)
Albumin: 4.4 g/dL (ref 3.8–4.8)
Alkaline Phosphatase: 194 IU/L — ABNORMAL HIGH (ref 44–121)
BUN/Creatinine Ratio: 20 (ref 12–28)
BUN: 14 mg/dL (ref 8–27)
Bilirubin Total: 0.3 mg/dL (ref 0.0–1.2)
CO2: 25 mmol/L (ref 20–29)
Calcium: 9.7 mg/dL (ref 8.7–10.3)
Chloride: 102 mmol/L (ref 96–106)
Creatinine, Ser: 0.71 mg/dL (ref 0.57–1.00)
Globulin, Total: 3.3 g/dL (ref 1.5–4.5)
Glucose: 136 mg/dL — ABNORMAL HIGH (ref 65–99)
Potassium: 5.1 mmol/L (ref 3.5–5.2)
Sodium: 141 mmol/L (ref 134–144)
Total Protein: 7.7 g/dL (ref 6.0–8.5)
eGFR: 93 mL/min/{1.73_m2} (ref >59)

## 2020-12-08 LAB — HEMOGLOBIN A1C
Est. average glucose Bld gHb Est-mCnc: 137 mg/dL
Hgb A1c MFr Bld: 6.4 % — ABNORMAL HIGH (ref 4.8–5.6)

## 2020-12-08 LAB — LIPID PANEL
Chol/HDL Ratio: 2.9 ratio (ref 0.0–4.4)
Cholesterol, Total: 168 mg/dL (ref 100–199)
HDL: 57 mg/dL (ref >39)
LDL Chol Calc (NIH): 78 mg/dL (ref 0–99)
Triglycerides: 197 mg/dL — ABNORMAL HIGH (ref 0–149)
VLDL Cholesterol Cal: 33 mg/dL (ref 5–40)

## 2020-12-08 LAB — TSH: TSH: 1.18 u[IU]/mL (ref 0.450–4.500)

## 2020-12-15 ENCOUNTER — Other Ambulatory Visit: Payer: Self-pay

## 2020-12-15 ENCOUNTER — Ambulatory Visit (INDEPENDENT_AMBULATORY_CARE_PROVIDER_SITE_OTHER): Payer: Medicare HMO | Admitting: Podiatry

## 2020-12-15 DIAGNOSIS — Z9889 Other specified postprocedural states: Secondary | ICD-10-CM

## 2020-12-15 DIAGNOSIS — M7751 Other enthesopathy of right foot: Secondary | ICD-10-CM

## 2020-12-30 NOTE — Progress Notes (Signed)
   Subjective:  Patient presents today status post excision of ganglion cyst right ankle. DOS: 10/28/2020.  Patient states that she is doing well.   Patient does have a new complaint today regarding some right ankle pain.  Patient states that she has had some slight discomfort to the anterior aspect of the ankle with a shooting sensation intermittently.  She would like to have it evaluated  Past Medical History:  Diagnosis Date  . Complication of anesthesia   . Diabetes mellitus without complication ([place])   . Diverticulosis 08/2018   partial colectomy  . Family history of adverse reaction to anesthesia    mom is slow to wake up  . Hypertension   . Hypothyroidism   . Irritable bowel   . Obesity   . PONV (postoperative nausea and vomiting)       Objective/Physical Exam Neurovascular status intact.  Skin incisions appear to be well coapted with staples intact. No sign of infectious process noted. No dehiscence. No active bleeding noted.  Negative for any significant edema noted to the surgical extremity.  Today the patient does have some minimal pain on palpation to the anterior aspect of the right ankle joint.  There is some moderate pain on palpation with range of motion as well.  Assessment: 1. s/p excision of ganglion cyst right ankle. DOS: 10/28/2020 2.  Capsulitis right anterior ankle   Plan of Care:  1. Patient was evaluated.  2.  Injection of 0.5 cc Celestone Soluspan injected into the anterior aspect of the right ankle joint 3.  In regards to the ganglion cyst that was removed, the patient may resume full activity no restrictions 4.  Return to clinic as needed  Edrick Kins, DPM [facility]  Dr. Edrick Kins, DPM    [address], [place] [address]                Office [phone]  Fax [phone]

## 2021-02-15 ENCOUNTER — Telehealth: Payer: Self-pay | Admitting: Internal Medicine

## 2021-02-15 NOTE — Telephone Encounter (Signed)
Patient called stating she only has 2 pills left of her metformin. Pt needs medication refilled, please call in to [facility] on [address]

## 2021-02-16 ENCOUNTER — Ambulatory Visit: Payer: Medicare HMO

## 2021-02-17 ENCOUNTER — Other Ambulatory Visit: Payer: Self-pay

## 2021-02-17 ENCOUNTER — Telehealth: Payer: Self-pay | Admitting: Internal Medicine

## 2021-02-17 ENCOUNTER — Ambulatory Visit
Admission: RE | Admit: 2021-02-17 | Discharge: 2021-02-17 | Disposition: A | Discharge status: Home / Self Care | Payer: Medicare HMO | Source: Ambulatory Visit | Attending: Internal Medicine | Admitting: Internal Medicine

## 2021-02-17 DIAGNOSIS — E119 Type 2 diabetes mellitus without complications: Secondary | ICD-10-CM

## 2021-02-17 DIAGNOSIS — Z1231 Encounter for screening mammogram for malignant neoplasm of breast: Secondary | ICD-10-CM | POA: Diagnosis not present

## 2021-02-17 MED ORDER — GLIPIZIDE ER 5 MG PO TB24: 5.0000 mg | ORAL_TABLET | Freq: Every day | ORAL | 0 refills | Status: DC

## 2021-02-17 MED ORDER — METFORMIN HCL 1000 MG PO TABS: 1000.0000 mg | ORAL_TABLET | Freq: Two times a day (BID) | ORAL | 0 refills | Status: DC

## 2021-02-17 NOTE — Progress Notes (Signed)
Metformin refilled.the patient needs to schedule appt for additional refills

## 2021-02-17 NOTE — Progress Notes (Signed)
Per pharmacy request Glipizide refilled for  Please keep scheduled appointment for additional refills

## 2021-02-17 NOTE — Telephone Encounter (Signed)
Called pt to inform her we need to schedule a follow up in July. If pt calls back please make appointment with Durene Fruits NP

## 2021-02-22 ENCOUNTER — Ambulatory Visit: Payer: Medicare HMO | Admitting: Family

## 2021-03-28 ENCOUNTER — Encounter: Payer: Self-pay | Admitting: Family Medicine

## 2021-03-28 ENCOUNTER — Ambulatory Visit: Payer: Medicare HMO | Admitting: Family

## 2021-03-28 ENCOUNTER — Ambulatory Visit (INDEPENDENT_AMBULATORY_CARE_PROVIDER_SITE_OTHER): Payer: Medicare HMO | Admitting: Family Medicine

## 2021-03-28 ENCOUNTER — Other Ambulatory Visit: Payer: Self-pay

## 2021-03-28 VITALS — BP 136/84 | HR 87 | Temp 98.3°F | Resp 18 | Ht 64.02 in | Wt 190.6 lb

## 2021-03-28 DIAGNOSIS — I10 Essential (primary) hypertension: Secondary | ICD-10-CM | POA: Diagnosis not present

## 2021-03-28 DIAGNOSIS — E119 Type 2 diabetes mellitus without complications: Secondary | ICD-10-CM | POA: Diagnosis not present

## 2021-03-28 DIAGNOSIS — F5101 Primary insomnia: Secondary | ICD-10-CM | POA: Diagnosis not present

## 2021-03-28 DIAGNOSIS — E785 Hyperlipidemia, unspecified: Secondary | ICD-10-CM

## 2021-03-28 DIAGNOSIS — E1169 Type 2 diabetes mellitus with other specified complication: Secondary | ICD-10-CM | POA: Diagnosis not present

## 2021-03-28 DIAGNOSIS — E039 Hypothyroidism, unspecified: Secondary | ICD-10-CM | POA: Diagnosis not present

## 2021-03-28 LAB — POCT GLYCOSYLATED HEMOGLOBIN (HGB A1C): Hemoglobin A1C: 6 % — AB (ref 4.0–5.6)

## 2021-03-28 LAB — GLUCOSE, POCT (MANUAL RESULT ENTRY): POC Glucose: 106 mg/dl — AB (ref 70–99)

## 2021-03-28 NOTE — Progress Notes (Signed)
Pt presents for prediabetes follow-up has no other concerns

## 2021-03-30 NOTE — Progress Notes (Signed)
New Patient Office Visit  Subjective:  Patient ID: RICKELL WIEHE, female    DOB: July 09, 1951  Age: 70 y.o. MRN: [id number]  CC:  Chief Complaint  Patient presents with   Prediabetes    HPI TANAYAH SQUITIERI presents for follow-up of chronic medical issues including diabetes and hypertension.  Patient denies acute complaints or concerns at this visit.  Past Medical History:  Diagnosis Date   Complication of anesthesia    Diabetes mellitus without complication ([place])    Diverticulosis 08/2018   partial colectomy   Family history of adverse reaction to anesthesia    mom is slow to wake up   Hypertension    Hypothyroidism    Irritable bowel    Obesity    PONV (postoperative nausea and vomiting)     Past Surgical History:  Procedure Laterality Date   CATARACT EXTRACTION, BILATERAL  2017   COLON SURGERY  08/2018   partial colectomy for diverticulosis   COLONOSCOPY N/A 07/12/2016   Procedure: COLONOSCOPY;  Surgeon: Wonda Horner, MD;  Location: [facility];  Service: Endoscopy;  Laterality: N/A;   HYSTEROSCOPY W/ ENDOMETRIAL ABLATION     SHOULDER ARTHROSCOPY WITH DISTAL CLAVICLE RESECTION Right 01/31/2019   Procedure: SHOULDER ARTHROSCOPY WITH DISTAL CLAVICLE RESECTION;  Surgeon: Earlie Server, MD;  Location: [facility];  Service: Orthopedics;  Laterality: Right;   SHOULDER ARTHROSCOPY WITH SUBACROMIAL DECOMPRESSION Right 01/31/2019   Procedure: SHOULDER ARTHROSCOPY WITH SUBACROMIAL DECOMPRESSION;  Surgeon: Earlie Server, MD;  Location: [facility];  Service: Orthopedics;  Laterality: Right;   TUBAL LIGATION     WISDOM TOOTH EXTRACTION      Family History  Problem Relation Age of Onset   Diabetes Mother    Breast cancer Mother    Heart disease Father    Cancer Brother    Breast cancer Sister     Social History   Socioeconomic History   Marital status: Married    Spouse name: Not on file   Number of children: Not on file   Years of  education: Not on file   Highest education level: Not on file  Occupational History   Not on file  Tobacco Use   Smoking status: Former    Packs/day: 2.00    Years: 27.00    Pack years: 54.00    Types: Cigarettes    Quit date: 01/10/2013    Years since quitting: 8.2   Smokeless tobacco: Never  Vaping Use   Vaping Use: Never used  Substance and Sexual Activity   Alcohol use: No    Alcohol/week: 0.0 standard drinks   Drug use: No   Sexual activity: Yes    Birth control/protection: None, Post-menopausal  Other Topics Concern   Not on file  Social History Narrative   Not on file   Social Determinants of Health   Financial Resource Strain: Not on file  Food Insecurity: Not on file  Transportation Needs: Not on file  Physical Activity: Not on file  Stress: Not on file  Social Connections: Not on file  Intimate Partner Violence: Not on file    ROS Review of Systems  All other systems reviewed and are negative.  Objective:   Today's Vitals: BP 136/84 (BP Location: Left Arm, Patient Position: Sitting, Cuff Size: Large)   Pulse 87   Temp 98.3 F (36.8 C)   Resp 18   Ht 5' 4.02" (1.626 m)   Wt 190 lb 9.6 oz (86.5 kg)  SpO2 93%   BMI 32.70 kg/m   Physical Exam Vitals and nursing note reviewed.  Constitutional:      General: She is not in acute distress. Cardiovascular:     Rate and Rhythm: Normal rate and regular rhythm.  Pulmonary:     Effort: Pulmonary effort is normal.     Breath sounds: Normal breath sounds.  Abdominal:     Palpations: Abdomen is soft.     Tenderness: There is no abdominal tenderness.  Musculoskeletal:     Cervical back: Normal range of motion and neck supple.     Right lower leg: No edema.     Left lower leg: No edema.  Neurological:     General: No focal deficit present.     Mental Status: She is alert and oriented to person, place, and time.    Assessment & Plan:   1. Type 2 diabetes mellitus without complication, without  long-term current use of insulin (HCC) A1c is stable and at goal.  Continue present management. - POCT glucose (manual entry) - POCT glycosylated hemoglobin (Hb A1C)  2. Essential hypertension Appears stable.  Continue present management.  3. Acquired hypothyroidism Appears stable.  Continue present management.  4. Hyperlipidemia associated with type 2 diabetes mellitus ([place]) Continue present management.  5. Primary insomnia Discussed's good sleep hygiene with patient.    Outpatient Encounter Medications as of 03/28/2021  Medication Sig   Ascorbic Acid (VITAMIN C) 1000 MG tablet Take 1,000 mg by mouth daily.   atorvastatin (LIPITOR) 80 MG tablet Take 1 tablet (80 mg total) by mouth daily at 6 PM.   Blood Glucose Monitoring Suppl (TRUE METRIX METER) w/Device KIT USE AS DIRECTED   calcium-vitamin D (OSCAL WITH D) 500-200 MG-UNIT tablet Take 2 tablets by mouth daily.   glipiZIDE (GLUCOTROL XL) 5 MG 24 hr tablet Take 1 tablet (5 mg total) by mouth daily.   glucose blood (TRUE METRIX BLOOD GLUCOSE TEST) test strip Use to check blood sugar once daily. Diagnosis code E11.9   levothyroxine (SYNTHROID) 75 MCG tablet TAKE 1 TABLET EVERY DAY   lisinopril (ZESTRIL) 10 MG tablet TAKE 1 TABLET EVERY DAY   magnesium 30 MG tablet Take 30 mg by mouth 2 (two) times daily.   metFORMIN (GLUCOPHAGE) 1000 MG tablet Take 1 tablet (1,000 mg total) by mouth 2 (two) times daily with a meal.   traMADol (ULTRAM) 50 MG tablet TAKE 1 TABLET BY MOUTH EVERY 4 HOURS AS NEEDED FOR UP TO 5 DAYS   TRUEplus Lancets 30G MISC Use to check blood sugar once daily. Diagnosis code E11.9   [DISCONTINUED] oxyCODONE-acetaminophen (PERCOCET) 5-325 MG tablet Take 1 tablet by mouth every 4 (four) hours as needed for severe pain.   No facility-administered encounter medications on file as of 03/28/2021.    Follow-up: Return in about 6 months (around 09/28/2021) for follow up.   Becky Sax, MD

## 2021-04-28 ENCOUNTER — Other Ambulatory Visit: Payer: Self-pay | Admitting: Nurse Practitioner

## 2021-04-28 DIAGNOSIS — H524 Presbyopia: Secondary | ICD-10-CM | POA: Diagnosis not present

## 2021-04-28 DIAGNOSIS — E1169 Type 2 diabetes mellitus with other specified complication: Secondary | ICD-10-CM

## 2021-04-28 DIAGNOSIS — E119 Type 2 diabetes mellitus without complications: Secondary | ICD-10-CM

## 2021-04-28 DIAGNOSIS — H04123 Dry eye syndrome of bilateral lacrimal glands: Secondary | ICD-10-CM | POA: Diagnosis not present

## 2021-04-28 DIAGNOSIS — Z961 Presence of intraocular lens: Secondary | ICD-10-CM | POA: Diagnosis not present

## 2021-04-28 DIAGNOSIS — H5213 Myopia, bilateral: Secondary | ICD-10-CM | POA: Diagnosis not present

## 2021-04-28 DIAGNOSIS — H40053 Ocular hypertension, bilateral: Secondary | ICD-10-CM | POA: Diagnosis not present

## 2021-04-28 DIAGNOSIS — H52209 Unspecified astigmatism, unspecified eye: Secondary | ICD-10-CM | POA: Diagnosis not present

## 2021-05-03 ENCOUNTER — Other Ambulatory Visit: Payer: Self-pay | Admitting: Family

## 2021-05-03 DIAGNOSIS — E119 Type 2 diabetes mellitus without complications: Secondary | ICD-10-CM

## 2021-05-04 ENCOUNTER — Other Ambulatory Visit: Payer: Self-pay

## 2021-05-04 DIAGNOSIS — E119 Type 2 diabetes mellitus without complications: Secondary | ICD-10-CM

## 2021-05-04 MED ORDER — METFORMIN HCL 1000 MG PO TABS
1000.0000 mg | ORAL_TABLET | Freq: Two times a day (BID) | ORAL | 0 refills | Status: DC
  Filled 2021-05-04: qty 60, 30d supply, fill #0

## 2021-05-04 MED ORDER — METFORMIN HCL 1000 MG PO TABS: 1000.0000 mg | ORAL_TABLET | Freq: Two times a day (BID) | ORAL | 0 refills | Status: DC

## 2021-05-05 ENCOUNTER — Telehealth: Payer: Self-pay | Admitting: Family Medicine

## 2021-05-05 NOTE — Telephone Encounter (Signed)
Left message for patient to call back and schedule Medicare Annual Wellness Visit (AWV  Laurie Michael 04/14/18 per [facility]  please schedule at anytime with health coach

## 2021-05-09 ENCOUNTER — Ambulatory Visit (INDEPENDENT_AMBULATORY_CARE_PROVIDER_SITE_OTHER): Payer: Medicare HMO

## 2021-05-09 ENCOUNTER — Other Ambulatory Visit: Payer: Self-pay

## 2021-05-09 NOTE — Progress Notes (Signed)
Patient called for her AWV, she says she forgot about it today and asked to be rescheduled for Wednesday 05/11/21 at 1700, patient placed on the schedule.

## 2021-05-11 ENCOUNTER — Ambulatory Visit: Payer: Medicare HMO

## 2021-05-20 ENCOUNTER — Other Ambulatory Visit: Payer: Self-pay | Admitting: *Deleted

## 2021-05-20 DIAGNOSIS — E119 Type 2 diabetes mellitus without complications: Secondary | ICD-10-CM

## 2021-05-20 DIAGNOSIS — E1169 Type 2 diabetes mellitus with other specified complication: Secondary | ICD-10-CM

## 2021-05-20 DIAGNOSIS — E039 Hypothyroidism, unspecified: Secondary | ICD-10-CM

## 2021-05-20 MED ORDER — LEVOTHYROXINE SODIUM 75 MCG PO TABS
75.0000 ug | ORAL_TABLET | Freq: Every day | ORAL | 0 refills | Status: DC
Start: 2021-05-20 — End: 2021-09-05

## 2021-05-20 MED ORDER — GLIPIZIDE ER 5 MG PO TB24: 5.0000 mg | ORAL_TABLET | Freq: Every day | ORAL | 0 refills | Status: DC

## 2021-05-20 MED ORDER — ATORVASTATIN CALCIUM 80 MG PO TABS: 80.0000 mg | ORAL_TABLET | Freq: Every day | ORAL | 0 refills | Status: DC

## 2021-05-20 NOTE — Progress Notes (Unsigned)
Refills  have been sent to [facility]

## 2021-06-03 ENCOUNTER — Other Ambulatory Visit: Payer: Self-pay | Admitting: *Deleted

## 2021-06-03 DIAGNOSIS — E119 Type 2 diabetes mellitus without complications: Secondary | ICD-10-CM

## 2021-06-03 DIAGNOSIS — I10 Essential (primary) hypertension: Secondary | ICD-10-CM

## 2021-06-03 MED ORDER — LISINOPRIL 10 MG PO TABS: 10.0000 mg | ORAL_TABLET | Freq: Every day | ORAL | 3 refills | Status: DC

## 2021-06-03 MED ORDER — METFORMIN HCL 1000 MG PO TABS
1000.0000 mg | ORAL_TABLET | Freq: Two times a day (BID) | ORAL | 0 refills | Status: DC
Start: 2021-06-03 — End: 2021-09-05

## 2021-06-08 ENCOUNTER — Encounter: Payer: Medicare HMO | Admitting: Family

## 2021-09-02 ENCOUNTER — Telehealth: Payer: Self-pay | Admitting: Family Medicine

## 2021-09-02 NOTE — Telephone Encounter (Signed)
Pt calling for med refills, states [employer] needs prior auth- and is also Asking when is she due for her flu shot?  Needs refills for:  atorvastatin (LIPITOR) 80 MG tablet [[id number]]   metFORMIN (GLUCOPHAGE) 1000 MG tablet [[id number]]    lisinopril (ZESTRIL) 10 MG tablet [[id number]]   glipiZIDE (GLUCOTROL XL) 5 MG 24 hr tablet [[id number]]   levothyroxine (SYNTHROID) 75 MCG tablet [[id number]]    Pharmacy  [facility] - [place]  [place], [place] [address]  Phone:  [phone]  Fax:  [phone]

## 2021-09-05 ENCOUNTER — Other Ambulatory Visit: Payer: Self-pay | Admitting: *Deleted

## 2021-09-05 DIAGNOSIS — E1169 Type 2 diabetes mellitus with other specified complication: Secondary | ICD-10-CM

## 2021-09-05 DIAGNOSIS — E039 Hypothyroidism, unspecified: Secondary | ICD-10-CM

## 2021-09-05 DIAGNOSIS — I10 Essential (primary) hypertension: Secondary | ICD-10-CM

## 2021-09-05 DIAGNOSIS — E119 Type 2 diabetes mellitus without complications: Secondary | ICD-10-CM

## 2021-09-05 MED ORDER — LEVOTHYROXINE SODIUM 75 MCG PO TABS: 75.0000 ug | ORAL_TABLET | Freq: Every day | ORAL | 0 refills | Status: DC

## 2021-09-05 MED ORDER — LISINOPRIL 10 MG PO TABS: 10.0000 mg | ORAL_TABLET | Freq: Every day | ORAL | 3 refills | Status: DC

## 2021-09-05 MED ORDER — ATORVASTATIN CALCIUM 80 MG PO TABS: 80.0000 mg | ORAL_TABLET | Freq: Every day | ORAL | 0 refills | Status: DC

## 2021-09-05 MED ORDER — METFORMIN HCL 1000 MG PO TABS: 1000.0000 mg | ORAL_TABLET | Freq: Two times a day (BID) | ORAL | 0 refills | Status: DC

## 2021-09-05 MED ORDER — GLIPIZIDE ER 5 MG PO TB24: 5.0000 mg | ORAL_TABLET | Freq: Every day | ORAL | 0 refills | Status: DC

## 2021-09-22 ENCOUNTER — Other Ambulatory Visit: Payer: Self-pay | Admitting: Family Medicine

## 2021-09-22 DIAGNOSIS — E119 Type 2 diabetes mellitus without complications: Secondary | ICD-10-CM

## 2021-09-28 ENCOUNTER — Ambulatory Visit (INDEPENDENT_AMBULATORY_CARE_PROVIDER_SITE_OTHER): Payer: Medicare HMO | Admitting: Family Medicine

## 2021-09-28 ENCOUNTER — Encounter: Payer: Self-pay | Admitting: Family Medicine

## 2021-09-28 ENCOUNTER — Other Ambulatory Visit: Payer: Self-pay

## 2021-09-28 ENCOUNTER — Telehealth: Payer: Self-pay | Admitting: Family Medicine

## 2021-09-28 VITALS — BP 130/83 | HR 89 | Temp 98.1°F | Resp 16 | Wt 195.2 lb

## 2021-09-28 DIAGNOSIS — E1169 Type 2 diabetes mellitus with other specified complication: Secondary | ICD-10-CM | POA: Diagnosis not present

## 2021-09-28 DIAGNOSIS — E785 Hyperlipidemia, unspecified: Secondary | ICD-10-CM | POA: Diagnosis not present

## 2021-09-28 DIAGNOSIS — E119 Type 2 diabetes mellitus without complications: Secondary | ICD-10-CM

## 2021-09-28 DIAGNOSIS — M25531 Pain in right wrist: Secondary | ICD-10-CM

## 2021-09-28 DIAGNOSIS — E039 Hypothyroidism, unspecified: Secondary | ICD-10-CM

## 2021-09-28 DIAGNOSIS — I10 Essential (primary) hypertension: Secondary | ICD-10-CM | POA: Diagnosis not present

## 2021-09-28 DIAGNOSIS — M25532 Pain in left wrist: Secondary | ICD-10-CM

## 2021-09-28 LAB — POCT GLYCOSYLATED HEMOGLOBIN (HGB A1C): Hemoglobin A1C: 6.2 % — AB (ref 4.0–5.6)

## 2021-09-28 MED ORDER — TRIAMCINOLONE ACETONIDE 40 MG/ML IJ SUSP
40.0000 mg | Freq: Once | INTRAMUSCULAR | Status: AC
  Administered 2021-09-29: 40 mg via INTRAMUSCULAR

## 2021-09-28 MED ORDER — ATORVASTATIN CALCIUM 80 MG PO TABS: 80.0000 mg | ORAL_TABLET | Freq: Every day | ORAL | 1 refills | Status: DC

## 2021-09-28 NOTE — Telephone Encounter (Signed)
Patient was called and VM was left for her to called office about her steroids

## 2021-09-28 NOTE — Progress Notes (Signed)
Patient is her for 6 month follow-up  Patient c/o right hand carpal tunnel that hurts. Patient uses a brace that helps but it still hurts

## 2021-09-28 NOTE — Telephone Encounter (Signed)
Pt states she is at [facility] on [address] and they did not receive a steroid  medication for her hand after the pt's visit as discussed. (Pt does not recall name of the steroid medication). [place] ([address] [place] [facility] [place]  [address] [place] ([place]) [place] [address]  Phone:  [phone]  Fax:  [phone]

## 2021-09-29 ENCOUNTER — Encounter: Payer: Self-pay | Admitting: Family Medicine

## 2021-09-29 ENCOUNTER — Ambulatory Visit: Payer: Medicare HMO

## 2021-09-29 ENCOUNTER — Telehealth: Payer: Self-pay | Admitting: Family Medicine

## 2021-09-29 DIAGNOSIS — M25532 Pain in left wrist: Secondary | ICD-10-CM | POA: Diagnosis not present

## 2021-09-29 DIAGNOSIS — E1169 Type 2 diabetes mellitus with other specified complication: Secondary | ICD-10-CM | POA: Diagnosis not present

## 2021-09-29 DIAGNOSIS — I10 Essential (primary) hypertension: Secondary | ICD-10-CM | POA: Diagnosis not present

## 2021-09-29 DIAGNOSIS — E039 Hypothyroidism, unspecified: Secondary | ICD-10-CM | POA: Diagnosis not present

## 2021-09-29 DIAGNOSIS — E785 Hyperlipidemia, unspecified: Secondary | ICD-10-CM | POA: Diagnosis not present

## 2021-09-29 DIAGNOSIS — M25531 Pain in right wrist: Secondary | ICD-10-CM | POA: Diagnosis not present

## 2021-09-29 NOTE — Progress Notes (Signed)
Established Patient Office Visit  Subjective:  Patient ID: Laurie Michael, female    DOB: 08-30-1950  Age: 71 y.o. MRN: [id number]  CC:  Chief Complaint  Patient presents with   Follow-up   Annual Exam    HPI [name] presents for follow up of chronic med issues including diabetes and hypertension. Patient also reports that she has bilateral wrist pain but her right is much worse than her left. She is right hand dominant. She is wearing a brace on the right wrist.  Past Medical History:  Diagnosis Date   Complication of anesthesia    Diabetes mellitus without complication ([place])    Diverticulosis 08/2018   partial colectomy   Family history of adverse reaction to anesthesia    mom is slow to wake up   Hypertension    Hypothyroidism    Irritable bowel    Obesity    PONV (postoperative nausea and vomiting)     Past Surgical History:  Procedure Laterality Date   CATARACT EXTRACTION, BILATERAL  2017   COLON SURGERY  08/2018   partial colectomy for diverticulosis   COLONOSCOPY N/A 07/12/2016   Procedure: COLONOSCOPY;  Surgeon: Wonda Horner, MD;  Location: [facility] ENDOSCOPY;  Service: Endoscopy;  Laterality: N/A;   HYSTEROSCOPY W/ ENDOMETRIAL ABLATION     SHOULDER ARTHROSCOPY WITH DISTAL CLAVICLE RESECTION Right 01/31/2019   Procedure: SHOULDER ARTHROSCOPY WITH DISTAL CLAVICLE RESECTION;  Surgeon: Earlie Server, MD;  Location: [facility];  Service: Orthopedics;  Laterality: Right;   SHOULDER ARTHROSCOPY WITH SUBACROMIAL DECOMPRESSION Right 01/31/2019   Procedure: SHOULDER ARTHROSCOPY WITH SUBACROMIAL DECOMPRESSION;  Surgeon: Earlie Server, MD;  Location: [facility];  Service: Orthopedics;  Laterality: Right;   TUBAL LIGATION     WISDOM TOOTH EXTRACTION      Family History  Problem Relation Age of Onset   Diabetes Mother    Breast cancer Mother    Heart disease Father    Cancer Brother    Breast cancer Sister     Social History    Socioeconomic History   Marital status: Married    Spouse name: Not on file   Number of children: Not on file   Years of education: Not on file   Highest education level: Not on file  Occupational History   Not on file  Tobacco Use   Smoking status: Former    Packs/day: 2.00    Years: 27.00    Pack years: 54.00    Types: Cigarettes    Quit date: 01/10/2013    Years since quitting: 8.7   Smokeless tobacco: Never  Vaping Use   Vaping Use: Never used  Substance and Sexual Activity   Alcohol use: No    Alcohol/week: 0.0 standard drinks   Drug use: No   Sexual activity: Yes    Birth control/protection: None, Post-menopausal  Other Topics Concern   Not on file  Social History Narrative   Not on file   Social Determinants of Health   Financial Resource Strain: Not on file  Food Insecurity: Not on file  Transportation Needs: Not on file  Physical Activity: Not on file  Stress: Not on file  Social Connections: Not on file  Intimate Partner Violence: Not on file    ROS Review of Systems  All other systems reviewed and are negative.  Objective:   Today's Vitals: BP 130/83    Pulse 89    Temp 98.1 F (36.7 C) (Oral)  Resp 16    Wt 195 lb 3.2 oz (88.5 kg)    SpO2 92%    BMI 33.49 kg/m   Physical Exam Vitals and nursing note reviewed.  Constitutional:      General: She is not in acute distress. Neck:     Thyroid: No thyromegaly.  Cardiovascular:     Rate and Rhythm: Normal rate and regular rhythm.  Pulmonary:     Effort: Pulmonary effort is normal.     Breath sounds: Normal breath sounds.  Abdominal:     Palpations: Abdomen is soft.     Tenderness: There is no abdominal tenderness.  Musculoskeletal:     Right wrist: Tenderness present. No swelling or deformity. Decreased range of motion.     Left wrist: Tenderness present. No deformity.     Cervical back: Normal range of motion and neck supple.  Neurological:     General: No focal deficit present.      Mental Status: She is alert and oriented to person, place, and time.    Assessment & Plan:   1. Type 2 diabetes mellitus without complication, without long-term current use of insulin (HCC) A1c slightly increased but still at goal. Continue present management and monitor - POCT glycosylated hemoglobin (Hb A1C) - atorvastatin (LIPITOR) 80 MG tablet; Take 1 tablet (80 mg total) by mouth daily at 6 PM.  Dispense: 90 tablet; Refill: 1  2. Essential hypertension Appears stable with present management. Continue.   3. Hypothyroidism, unspecified type Appears stable with present management. Continue and monitor  4. Pain in both wrists Kenalog IM injection given. Tylenol/nsaids prn.  - triamcinolone acetonide (KENALOG-40) injection 40 mg  5. Hyperlipidemia associated with type 2 diabetes mellitus ([place]) Continue present management. Meds refilled.  - atorvastatin (LIPITOR) 80 MG tablet; Take 1 tablet (80 mg total) by mouth daily at 6 PM.  Dispense: 90 tablet; Refill: 1    Outpatient Encounter Medications as of 09/28/2021  Medication Sig   Ascorbic Acid (VITAMIN C) 1000 MG tablet Take 1,000 mg by mouth daily.   Blood Glucose Monitoring Suppl (TRUE METRIX METER) w/Device KIT USE AS DIRECTED   calcium-vitamin D (OSCAL WITH D) 500-200 MG-UNIT tablet Take 2 tablets by mouth daily.   glipiZIDE (GLUCOTROL XL) 5 MG 24 hr tablet Take 1 tablet (5 mg total) by mouth daily.   glucose blood (TRUE METRIX BLOOD GLUCOSE TEST) test strip Use to check blood sugar once daily. Diagnosis code E11.9   levothyroxine (SYNTHROID) 75 MCG tablet Take 1 tablet (75 mcg total) by mouth daily.   lisinopril (ZESTRIL) 10 MG tablet Take 1 tablet (10 mg total) by mouth daily.   magnesium 30 MG tablet Take 30 mg by mouth 2 (two) times daily.   metFORMIN (GLUCOPHAGE) 1000 MG tablet Take 1 tablet (1,000 mg total) by mouth 2 (two) times daily with a meal.   traMADol (ULTRAM) 50 MG tablet TAKE 1 TABLET BY MOUTH EVERY 4 HOURS AS  NEEDED FOR UP TO 5 DAYS   TRUEplus Lancets 30G MISC Use to check blood sugar once daily. Diagnosis code E11.9   [DISCONTINUED] atorvastatin (LIPITOR) 80 MG tablet Take 1 tablet (80 mg total) by mouth daily at 6 PM.   atorvastatin (LIPITOR) 80 MG tablet Take 1 tablet (80 mg total) by mouth daily at 6 PM.   Facility-Administered Encounter Medications as of 09/28/2021  Medication   triamcinolone acetonide (KENALOG-40) injection 40 mg    Follow-up: Return in about 6 months (around 03/28/2022) for follow  up.   Becky Sax, MD

## 2021-09-29 NOTE — Telephone Encounter (Signed)
Pt returning Nurse's call about Steroids but pt asked to be called at [phone] when Nurse is available. Thank you.

## 2021-10-10 ENCOUNTER — Encounter: Payer: Self-pay | Admitting: Physician Assistant

## 2021-10-10 ENCOUNTER — Other Ambulatory Visit: Payer: Self-pay

## 2021-10-10 ENCOUNTER — Ambulatory Visit (INDEPENDENT_AMBULATORY_CARE_PROVIDER_SITE_OTHER): Payer: Medicare HMO | Admitting: Physician Assistant

## 2021-10-10 VITALS — BP 135/85 | HR 92 | Temp 98.2°F | Resp 18 | Ht 62.0 in | Wt 195.0 lb

## 2021-10-10 DIAGNOSIS — G5603 Carpal tunnel syndrome, bilateral upper limbs: Secondary | ICD-10-CM

## 2021-10-10 MED ORDER — PREDNISONE 20 MG PO TABS: 20.0000 mg | ORAL_TABLET | Freq: Every day | ORAL | 0 refills | Status: DC

## 2021-10-10 NOTE — Patient Instructions (Signed)
We encourage you to wear theYou are going to take prednisone 20 mg once a day for the next 10 days.  I strongly encourage you to wear the night brace every single night without exception.  I hope that you feel better soon, please let us know if there is nothing else we can do for you.  Kennieth Rad, PA-C Physician Assistant [facility] [email or web address]   Carpal Tunnel Syndrome Carpal tunnel syndrome is a condition that causes pain, numbness, and weakness in your hand and fingers. The carpal tunnel is a narrow area located on the palm side of your wrist. Repeated wrist motion or certain diseases may cause swelling within the tunnel. This swelling pinches the main nerve in the wrist. The main nerve in the wrist is called the median nerve. What are the causes? This condition may be caused by: Repeated and forceful wrist and hand motions. Wrist injuries. Arthritis. A cyst or tumor in the carpal tunnel. Fluid buildup during pregnancy. Use of tools that vibrate. Sometimes the cause of this condition is not known. What increases the risk? The following factors may make you more likely to develop this condition: Having a job that requires you to repeatedly or forcefully move your wrist or hand or requires you to use tools that vibrate. This may include jobs that involve using computers, working on an [employer], or working with [place] such as Pension scheme manager. Being a woman. Having certain conditions, such as: Diabetes. Obesity. An underactive thyroid (hypothyroidism). Kidney failure. Rheumatoid arthritis. What are the signs or symptoms? Symptoms of this condition include: A tingling feeling in your fingers, especially in your thumb, index, and middle fingers. Tingling or numbness in your hand. An aching feeling in your entire arm, especially when your wrist and elbow are bent for a long time. Wrist pain that goes up  your arm to your shoulder. Pain that goes down into your palm or fingers. A weak feeling in your hands. You may have trouble grabbing and holding items. Your symptoms may feel worse during the night. How is this diagnosed? This condition is diagnosed with a medical history and physical exam. You may also have tests, including: Electromyogram (EMG). This test measures electrical signals sent by your nerves into the muscles. Nerve conduction study. This test measures how well electrical signals pass through your nerves. Imaging tests, such as X-rays, ultrasound, and MRI. These tests check for possible causes of your condition. How is this treated? This condition may be treated with: Lifestyle changes. It is important to stop or change the activity that caused your condition. Doing exercise and activities to strengthen and stretch your muscles and tendons (physical therapy). Making lifestyle changes to help with your condition and learning how to do your daily activities safely (occupational therapy). Medicines for pain and inflammation. This may include medicine that is injected into your wrist. A wrist splint or brace. Surgery. Follow these instructions at home: If you have a splint or brace: Wear the splint or brace as told by your health care provider. Remove it only as told by your health care provider. Loosen the splint or brace if your fingers tingle, become numb, or turn cold and blue. Keep the splint or brace clean. If the splint or brace is not waterproof: Do not let it get wet. Cover it with a watertight covering when you take a bath or shower. Managing pain, stiffness, and swelling If directed, put ice on the painful area. To do  this: If you have a removeable splint or brace, remove it as told by your health care provider. Put ice in a plastic bag. Place a towel between your skin and the bag or between the splint or brace and the bag. Leave the ice on for 20 minutes, 2-3 times  a day. Do not fall asleep with the cold pack on your skin. Remove the ice if your skin turns bright red. This is very important. If you cannot feel pain, heat, or cold, you have a greater risk of damage to the area. Move your fingers often to reduce stiffness and swelling. General instructions Take over-the-counter and prescription medicines only as told by your health care provider. Rest your wrist and hand from any activity that may be causing your pain. If your condition is work related, talk with your employer about changes that can be made, such as getting a wrist pad to use while typing. Do any exercises as told by your health care provider, physical therapist, or occupational therapist. Keep all follow-up visits. This is important. Contact a health care provider if: You have new symptoms. Your pain is not controlled with medicines. Your symptoms get worse. Get help right away if: You have severe numbness or tingling in your wrist or hand. Summary Carpal tunnel syndrome is a condition that causes pain, numbness, and weakness in your hand and fingers. It is usually caused by repeated wrist motions. Lifestyle changes and medicines are used to treat carpal tunnel syndrome. Surgery may be recommended. Follow your health care provider's instructions about wearing a splint, resting from activity, keeping follow-up visits, and calling for help. This information is not intended to replace advice given to you by your health care provider. Make sure you discuss any questions you have with your health care provider. Document Revised: 12/11/2019 Document Reviewed: 12/11/2019 Elsevier Patient Education  [place].

## 2021-10-10 NOTE — Progress Notes (Signed)
Established Patient Office Visit  Subjective:  Patient ID: Laurie Michael, female    DOB: [birth year]  Age: 71 y.o. MRN: [id number]  CC:  Chief Complaint  Patient presents with   Wrist Pain    R    HPI [name] states that she has been having pain in both of her wrist for many years, states that recently her right wrist has become more painful.  Reports that she will have tingling in her neck, tingling to her fingers, states when she wakes in the morning her hand is numb and she has to shake it out.  Reports that she was seen by her primary care provider approximately 2 weeks ago, states that she was given a shot without any relief.  Denies injury or trauma to her wrist, does endorse history of carpal tunnel.  Reports that she does have a wrist brace that she will use, has been wearing it when the pain is severe.  States that it does offer some relief.  Past Medical History:  Diagnosis Date   Complication of anesthesia    Diabetes mellitus without complication ([place])    Diverticulosis 08/2018   partial colectomy   Family history of adverse reaction to anesthesia    mom is slow to wake up   Hypertension    Hypothyroidism    Irritable bowel    Obesity    PONV (postoperative nausea and vomiting)     Past Surgical History:  Procedure Laterality Date   CATARACT EXTRACTION, BILATERAL  2017   COLON SURGERY  08/2018   partial colectomy for diverticulosis   COLONOSCOPY N/A 07/12/2016   Procedure: COLONOSCOPY;  Surgeon: Wonda Horner, MD;  Location: [facility];  Service: Endoscopy;  Laterality: N/A;   HYSTEROSCOPY W/ ENDOMETRIAL ABLATION     SHOULDER ARTHROSCOPY WITH DISTAL CLAVICLE RESECTION Right 01/31/2019   Procedure: SHOULDER ARTHROSCOPY WITH DISTAL CLAVICLE RESECTION;  Surgeon: Earlie Server, MD;  Location: [facility];  Service: Orthopedics;  Laterality: Right;   SHOULDER ARTHROSCOPY WITH SUBACROMIAL DECOMPRESSION Right 01/31/2019   Procedure:  SHOULDER ARTHROSCOPY WITH SUBACROMIAL DECOMPRESSION;  Surgeon: Earlie Server, MD;  Location: [facility];  Service: Orthopedics;  Laterality: Right;   TUBAL LIGATION     WISDOM TOOTH EXTRACTION      Family History  Problem Relation Age of Onset   Diabetes Mother    Breast cancer Mother    Heart disease Father    Cancer Brother    Breast cancer Sister     Social History   Socioeconomic History   Marital status: Married    Spouse name: Not on file   Number of children: Not on file   Years of education: Not on file   Highest education level: Not on file  Occupational History   Not on file  Tobacco Use   Smoking status: Former    Packs/day: 2.00    Years: 27.00    Pack years: 54.00    Types: Cigarettes    Quit date: 01/10/2013    Years since quitting: 8.7   Smokeless tobacco: Never  Vaping Use   Vaping Use: Never used  Substance and Sexual Activity   Alcohol use: No    Alcohol/week: 0.0 standard drinks   Drug use: No   Sexual activity: Yes    Birth control/protection: None, Post-menopausal  Other Topics Concern   Not on file  Social History Narrative   Not on file   Social Determinants of Health  Financial Resource Strain: Not on file  Food Insecurity: Not on file  Transportation Needs: Not on file  Physical Activity: Not on file  Stress: Not on file  Social Connections: Not on file  Intimate Partner Violence: Not on file    Outpatient Medications Prior to Visit  Medication Sig Dispense Refill   Ascorbic Acid (VITAMIN C) 1000 MG tablet Take 1,000 mg by mouth daily.     atorvastatin (LIPITOR) 80 MG tablet Take 1 tablet (80 mg total) by mouth daily at 6 PM. 90 tablet 1   Blood Glucose Monitoring Suppl (TRUE METRIX METER) w/Device KIT USE AS DIRECTED 1 kit 0   calcium-vitamin D (OSCAL WITH D) 500-200 MG-UNIT tablet Take 2 tablets by mouth daily.     glipiZIDE (GLUCOTROL XL) 5 MG 24 hr tablet Take 1 tablet (5 mg total) by mouth daily. 90 tablet 0    glucose blood (TRUE METRIX BLOOD GLUCOSE TEST) test strip Use to check blood sugar once daily. Diagnosis code E11.9 100 each 2   levothyroxine (SYNTHROID) 75 MCG tablet Take 1 tablet (75 mcg total) by mouth daily. 90 tablet 0   lisinopril (ZESTRIL) 10 MG tablet Take 1 tablet (10 mg total) by mouth daily. 90 tablet 3   magnesium 30 MG tablet Take 30 mg by mouth 2 (two) times daily.     metFORMIN (GLUCOPHAGE) 1000 MG tablet Take 1 tablet (1,000 mg total) by mouth 2 (two) times daily with a meal. 180 tablet 0   traMADol (ULTRAM) 50 MG tablet TAKE 1 TABLET BY MOUTH EVERY 4 HOURS AS NEEDED FOR UP TO 5 DAYS 30 tablet 0   TRUEplus Lancets 30G MISC Use to check blood sugar once daily. Diagnosis code E11.9 100 each 2   No facility-administered medications prior to visit.    No Known Allergies  ROS Review of Systems  Constitutional: Negative.   HENT: Negative.    Eyes: Negative.   Respiratory:  Negative for shortness of breath.   Cardiovascular:  Negative for chest pain.  Gastrointestinal: Negative.   Endocrine: Negative.   Genitourinary: Negative.   Musculoskeletal: Negative.   Skin: Negative.   Allergic/Immunologic: Negative.   Neurological: Negative.   Hematological: Negative.   Psychiatric/Behavioral: Negative.       Objective:    Physical Exam Vitals and nursing note reviewed.  Constitutional:      Appearance: Normal appearance.  HENT:     Head: Normocephalic and atraumatic.     Right Ear: External ear normal.     Left Ear: External ear normal.     Nose: Nose normal.     Mouth/Throat:     Mouth: Mucous membranes are moist.     Pharynx: Oropharynx is clear.  Eyes:     Extraocular Movements: Extraocular movements intact.     Conjunctiva/sclera: Conjunctivae normal.     Pupils: Pupils are equal, round, and reactive to light.  Cardiovascular:     Rate and Rhythm: Normal rate and regular rhythm.     Pulses: Normal pulses.     Heart sounds: Normal heart sounds.   Pulmonary:     Effort: Pulmonary effort is normal.     Breath sounds: Normal breath sounds.  Musculoskeletal:     Right hand: No swelling. Normal range of motion. Decreased strength. Normal capillary refill. Normal pulse.     Left hand: Normal.     Cervical back: Normal range of motion and neck supple.  Skin:    General: Skin is warm and  dry.  Neurological:     General: No focal deficit present.     Mental Status: She is alert and oriented to person, place, and time.  Psychiatric:        Mood and Affect: Mood normal.        Behavior: Behavior normal.        Thought Content: Thought content normal.        Judgment: Judgment normal.    BP 135/85 (BP Location: Right Arm, Patient Position: Sitting, Cuff Size: Normal)    Pulse 92    Temp 98.2 F (36.8 C) (Oral)    Resp 18    Ht '5\' 2"'  (1.575 m)    Wt 195 lb (88.5 kg)    SpO2 94%    BMI 35.67 kg/m  Wt Readings from Last 3 Encounters:  10/10/21 195 lb (88.5 kg)  09/28/21 195 lb 3.2 oz (88.5 kg)  03/28/21 190 lb 9.6 oz (86.5 kg)     Health Maintenance Due  Topic Date Due   FOOT EXAM  05/24/2021    There are no preventive care reminders to display for this patient.  Lab Results  Component Value Date   TSH 1.180 12/07/2020   Lab Results  Component Value Date   WBC 10.7 05/24/2020   HGB 12.8 05/24/2020   HCT 39.6 05/24/2020   MCV 87 05/24/2020   PLT 406 05/24/2020   Lab Results  Component Value Date   NA 141 12/07/2020   K 5.1 12/07/2020   CO2 25 12/07/2020   GLUCOSE 136 (H) 12/07/2020   BUN 14 12/07/2020   CREATININE 0.71 12/07/2020   BILITOT 0.3 12/07/2020   ALKPHOS 194 (H) 12/07/2020   AST 16 12/07/2020   ALT 18 12/07/2020   PROT 7.7 12/07/2020   ALBUMIN 4.4 12/07/2020   CALCIUM 9.7 12/07/2020   ANIONGAP 12 02/29/2016   EGFR 93 12/07/2020   Lab Results  Component Value Date   CHOL 168 12/07/2020   Lab Results  Component Value Date   HDL 57 12/07/2020   Lab Results  Component Value Date   LDLCALC 78  12/07/2020   Lab Results  Component Value Date   TRIG 197 (H) 12/07/2020   Lab Results  Component Value Date   CHOLHDL 2.9 12/07/2020   Lab Results  Component Value Date   HGBA1C 6.2 (A) 09/28/2021      Assessment & Plan:   Problem List Items Addressed This Visit       Nervous and Auditory   Carpal tunnel syndrome, bilateral - Primary   Relevant Medications   predniSONE (DELTASONE) 20 MG tablet    Meds ordered this encounter  Medications   predniSONE (DELTASONE) 20 MG tablet    Sig: Take 1 tablet (20 mg total) by mouth daily with breakfast.    Dispense:  10 tablet    Refill:  0    Order Specific Question:   Supervising Provider    Answer:   WRIGHT, PATRICK E [[id number]]  1. Carpal tunnel syndrome, bilateral Trial prednisone, lat A1C 6.2, patient education given on supportive care, strongly encouraged wearing a brace while sleeping.  Patient understands and agrees.  Red flags given for prompt reevaluation. - predniSONE (DELTASONE) 20 MG tablet; Take 1 tablet (20 mg total) by mouth daily with breakfast.  Dispense: 10 tablet; Refill: 0   I have reviewed the patient's medical history (PMH, PSH, Social History, Family History, Medications, and allergies) , and have been updated if relevant. I spent  20 minutes reviewing chart and  face to face time with patient.      Follow-up: Return if symptoms worsen or fail to improve.    Loraine Grip Mayers, PA-C

## 2021-11-15 ENCOUNTER — Other Ambulatory Visit: Payer: Self-pay | Admitting: Family Medicine

## 2021-11-15 DIAGNOSIS — E119 Type 2 diabetes mellitus without complications: Secondary | ICD-10-CM

## 2021-11-15 DIAGNOSIS — E1169 Type 2 diabetes mellitus with other specified complication: Secondary | ICD-10-CM

## 2021-11-15 NOTE — Telephone Encounter (Signed)
Copied from [facility] [phone] [phone]. Topic: Quick Communication - Rx Refill/Question ?>> Nov 15, 2021  4:43 PM Lenon Curt, Everette A wrote: ?Medication: atorvastatin (LIPITOR) 80 MG tablet [[id number]]  ? ?Has the patient contacted their pharmacy? Yes.  The patient has been directed to contact their PCP ?(Agent: If no, request that the patient contact the pharmacy for the refill. If patient does not wish to contact the pharmacy document the reason why and proceed with request.) ?(Agent: If yes, when and what did the pharmacy advise?) ? ?Preferred Pharmacy (with phone number or street name): [facility], [place] ?[place] [address] ?Phone: [phone] Fax: [phone] ?Hours: Not open 24 hours ? ?Has the patient been seen for an appointment in the last year OR does the patient have an upcoming appointment? Yes.   ? ?Agent: Please be advised that RX refills may take up to 3 business days. We ask that you follow-up with your pharmacy. ?

## 2021-11-16 MED ORDER — ATORVASTATIN CALCIUM 80 MG PO TABS: 80.0000 mg | ORAL_TABLET | Freq: Every day | ORAL | 1 refills | Status: DC

## 2021-11-16 NOTE — Telephone Encounter (Signed)
Requested Prescriptions  ?Pending Prescriptions Disp Refills  ?? atorvastatin (LIPITOR) 80 MG tablet 90 tablet 1  ?  Sig: Take 1 tablet (80 mg total) by mouth daily at 6 PM.  ?  ? Cardiovascular:  Antilipid - Statins Failed - 11/15/2021  4:57 PM  ?  ?  Failed - Lipid Panel in normal range within the last 12 months  ?  Cholesterol, Total  ?Date Value Ref Range Status  ?12/07/2020 168 100 - 199 mg/dL Final  ? ?LDL Chol Calc (NIH)  ?Date Value Ref Range Status  ?12/07/2020 78 0 - 99 mg/dL Final  ? ?LDL Direct  ?Date Value Ref Range Status  ?10/24/2016 79 0 - 99 mg/dL Final  ? ?HDL  ?Date Value Ref Range Status  ?12/07/2020 57 >39 mg/dL Final  ? ?Triglycerides  ?Date Value Ref Range Status  ?12/07/2020 197 (H) 0 - 149 mg/dL Final  ? ?  ?  ?  Passed - Patient is not pregnant  ?  ?  Passed - Valid encounter within last 12 months  ?  Recent Outpatient Visits   ?      ? 1 month ago Carpal tunnel syndrome, bilateral  ? Primary Care at [facility] [place], PA-C  ? 1 month ago Type 2 diabetes mellitus without complication, without long-term current use of insulin ([place])  ? [facility] Care at [facility], MD  ? 7 months ago Type 2 diabetes mellitus without complication, without long-term current use of insulin ([place])  ? Primary Care at [facility], MD  ? 11 months ago Type 2 diabetes mellitus without complication, without long-term current use of insulin ([place])  ? Primary Care at [facility], NP  ? 1 year ago Right foot pain  ? Primary Care at [facility], PA-C  ?  ?  ?Future Appointments   ?        ? In 4 months Dorna Mai, MD Primary Care at [facility]  ?  ? ?  ?  ?  ? ? ?

## 2021-12-22 DIAGNOSIS — H524 Presbyopia: Secondary | ICD-10-CM | POA: Diagnosis not present

## 2021-12-22 DIAGNOSIS — H5213 Myopia, bilateral: Secondary | ICD-10-CM | POA: Diagnosis not present

## 2021-12-22 DIAGNOSIS — H52209 Unspecified astigmatism, unspecified eye: Secondary | ICD-10-CM | POA: Diagnosis not present

## 2022-03-06 ENCOUNTER — Other Ambulatory Visit: Payer: Self-pay | Admitting: Family Medicine

## 2022-03-06 DIAGNOSIS — E119 Type 2 diabetes mellitus without complications: Secondary | ICD-10-CM

## 2022-03-06 NOTE — Telephone Encounter (Signed)
Medication Refill - Medication: glipiZIDE (GLUCOTROL XL) 5 MG 24 hr tablet [[id number]]   Pt reports that she is completely out of medication  Has the patient contacted their pharmacy? Yes.   (Agent: If no, request that the patient contact the pharmacy for the refill. If patient does not wish to contact the pharmacy document the reason why and proceed with request.) (Agent: If yes, when and what did the pharmacy advise?)  Preferred Pharmacy (with phone number or street name):  [facility] ([address]), [place] [address]  [address] [place] [address]  Phone: [phone] Fax: [phone]  Hours: Not open 24 hours   Has the patient been seen for an appointment in the last year OR does the patient have an upcoming appointment? Yes.    Agent: Please be advised that RX refills may take up to 3 business days. We ask that you follow-up with your pharmacy.

## 2022-03-07 ENCOUNTER — Other Ambulatory Visit: Payer: Self-pay | Admitting: *Deleted

## 2022-03-07 ENCOUNTER — Telehealth: Payer: Self-pay | Admitting: Family Medicine

## 2022-03-07 DIAGNOSIS — E119 Type 2 diabetes mellitus without complications: Secondary | ICD-10-CM

## 2022-03-07 MED ORDER — GLIPIZIDE ER 5 MG PO TB24: 5.0000 mg | ORAL_TABLET | Freq: Every day | ORAL | 0 refills | Status: DC

## 2022-03-07 NOTE — Telephone Encounter (Signed)
Refill sent to pharmacy.   

## 2022-03-07 NOTE — Telephone Encounter (Signed)
Pt came in checking on status of refill  request for Glipizide--- Saturday was the last she had this.   Medication Refill - Medication: glipiZIDE (GLUCOTROL XL) 5 MG 24 hr tablet [[id number]]    Pt reports that she is completely out of medication   Has the patient contacted their pharmacy? Yes.   (Agent: If no, request that the patient contact the pharmacy for the refill. If patient does not wish to contact the pharmacy document the reason why and proceed with request.) (Agent: If yes, when and what did the pharmacy advise?)   Preferred Pharmacy (with phone number or street name):  [facility] ([address]), [place] [address]  [address]  ([place]) [place] [address]  Phone: [phone] Fax: [phone]  Hours: Not open 24 hours    Has the patient been seen for an appointment in the last year OR does the patient have an upcoming appointment? Yes.     Agent: Please be advised that RX refills may take up to 3 business days. We ask that you follow-up with your pharmacy.

## 2022-03-27 ENCOUNTER — Ambulatory Visit: Payer: Medicare PPO | Admitting: Family Medicine

## 2022-03-28 ENCOUNTER — Other Ambulatory Visit: Payer: Self-pay | Admitting: *Deleted

## 2022-03-28 DIAGNOSIS — E119 Type 2 diabetes mellitus without complications: Secondary | ICD-10-CM

## 2022-03-28 MED ORDER — GLIPIZIDE ER 5 MG PO TB24: 5.0000 mg | ORAL_TABLET | Freq: Every day | ORAL | 0 refills | Status: DC

## 2022-04-03 ENCOUNTER — Other Ambulatory Visit: Payer: Self-pay | Admitting: Family Medicine

## 2022-04-03 DIAGNOSIS — E119 Type 2 diabetes mellitus without complications: Secondary | ICD-10-CM

## 2022-04-03 NOTE — Telephone Encounter (Signed)
Medication Refill - Medication: glipiZIDE (GLUCOTROL XL) 5 MG 24 hr tablet  Pt stated she [employer] has not sent her in the mail order again and she only had 3 tablets left.    Has the patient contacted their pharmacy? No. (Agent: If no, request that the patient contact the pharmacy for the refill. If patient does not wish to contact the pharmacy document the reason why and proceed with request.)   Preferred Pharmacy (with phone number or street name):  [facility] ([address]), [place] [address]  [address] [place]) [place] [address]  Phone: [phone] Fax: [phone]   Has the patient been seen for an appointment in the last year OR does the patient have an upcoming appointment? Yes.    Agent: Please be advised that RX refills may take up to 3 business days. We ask that you follow-up with your pharmacy.

## 2022-04-04 MED ORDER — GLIPIZIDE ER 5 MG PO TB24: 5.0000 mg | ORAL_TABLET | Freq: Every day | ORAL | 0 refills | Status: DC

## 2022-04-04 NOTE — Telephone Encounter (Signed)
Change in pharmacy- patient request local Requested Prescriptions  Pending Prescriptions Disp Refills  . glipiZIDE (GLUCOTROL XL) 5 MG 24 hr tablet 30 tablet 0    Sig: Take 1 tablet (5 mg total) by mouth daily.     Endocrinology:  Diabetes - Sulfonylureas Failed - 04/03/2022  4:22 PM      Failed - HBA1C is between 0 and 7.9 and within 180 days    Hemoglobin A1C  Date Value Ref Range Status  09/28/2021 6.2 (A) 4.0 - 5.6 % Final   HbA1c, POC (controlled diabetic range)  Date Value Ref Range Status  05/24/2020 6.0 0.0 - 7.0 % Final   Hgb A1c MFr Bld  Date Value Ref Range Status  12/07/2020 6.4 (H) 4.8 - 5.6 % Final    Comment:             Prediabetes: 5.7 - 6.4          Diabetes: >6.4          Glycemic control for adults with diabetes: <7.0          Failed - Cr in normal range and within 360 days    Creatinine, Ser  Date Value Ref Range Status  12/07/2020 0.71 0.57 - 1.00 mg/dL Final         Passed - Valid encounter within last 6 months    Recent Outpatient Visits          5 months ago Carpal tunnel syndrome, bilateral   Primary Care at [facility], Cari S, PA-C   6 months ago Type 2 diabetes mellitus without complication, without long-term current use of insulin ([place])   Primary Care at [facility], Clyde Canterbury, MD   1 year ago Type 2 diabetes mellitus without complication, without long-term current use of insulin [facility])   Primary Care at [facility], Clyde Canterbury, MD   1 year ago Type 2 diabetes mellitus without complication, without long-term current use of insulin [facility])   Primary Care at [facility], Kriste Basque, NP   1 year ago Right foot pain   Primary Care at [facility], Loraine Grip, PA-C      Future Appointments            In 1 week Dorna Mai, MD Primary Care at [facility]

## 2022-04-07 ENCOUNTER — Other Ambulatory Visit: Payer: Self-pay | Admitting: Family Medicine

## 2022-04-07 DIAGNOSIS — E1169 Type 2 diabetes mellitus with other specified complication: Secondary | ICD-10-CM

## 2022-04-07 DIAGNOSIS — E119 Type 2 diabetes mellitus without complications: Secondary | ICD-10-CM

## 2022-04-12 ENCOUNTER — Other Ambulatory Visit: Payer: Self-pay | Admitting: Family Medicine

## 2022-04-12 DIAGNOSIS — E039 Hypothyroidism, unspecified: Secondary | ICD-10-CM

## 2022-04-12 DIAGNOSIS — E119 Type 2 diabetes mellitus without complications: Secondary | ICD-10-CM

## 2022-04-13 ENCOUNTER — Ambulatory Visit: Payer: Medicare PPO | Admitting: Family Medicine

## 2022-04-27 ENCOUNTER — Ambulatory Visit (INDEPENDENT_AMBULATORY_CARE_PROVIDER_SITE_OTHER): Payer: Medicare PPO | Admitting: Family Medicine

## 2022-04-27 ENCOUNTER — Encounter: Payer: Self-pay | Admitting: Family Medicine

## 2022-04-27 VITALS — BP 133/86 | HR 88 | Temp 98.1°F | Resp 16 | Wt 193.4 lb

## 2022-04-27 DIAGNOSIS — E119 Type 2 diabetes mellitus without complications: Secondary | ICD-10-CM

## 2022-04-27 DIAGNOSIS — M5442 Lumbago with sciatica, left side: Secondary | ICD-10-CM

## 2022-04-27 DIAGNOSIS — E785 Hyperlipidemia, unspecified: Secondary | ICD-10-CM

## 2022-04-27 DIAGNOSIS — E039 Hypothyroidism, unspecified: Secondary | ICD-10-CM | POA: Diagnosis not present

## 2022-04-27 DIAGNOSIS — G8929 Other chronic pain: Secondary | ICD-10-CM

## 2022-04-27 DIAGNOSIS — E1169 Type 2 diabetes mellitus with other specified complication: Secondary | ICD-10-CM | POA: Diagnosis not present

## 2022-04-27 DIAGNOSIS — M5441 Lumbago with sciatica, right side: Secondary | ICD-10-CM

## 2022-04-27 DIAGNOSIS — I10 Essential (primary) hypertension: Secondary | ICD-10-CM | POA: Diagnosis not present

## 2022-04-27 MED ORDER — GABAPENTIN 300 MG PO CAPS: 300.0000 mg | ORAL_CAPSULE | Freq: Three times a day (TID) | ORAL | 1 refills | Status: AC

## 2022-04-27 NOTE — Progress Notes (Unsigned)
New Patient Office Visit  Subjective    Patient ID: Laurie Michael, female    DOB: [birth year]  Age: 71 y.o. MRN: [id number]  CC:  Chief Complaint  Patient presents with   Follow-up   Diabetes    HPI Laurie Michael presents to establish care ***  Outpatient Encounter Medications as of 04/27/2022  Medication Sig   Ascorbic Acid (VITAMIN C) 1000 MG tablet Take 1,000 mg by mouth daily.   atorvastatin (LIPITOR) 80 MG tablet TAKE 1 TABLET EVERY DAY AT 6 PM   Blood Glucose Monitoring Suppl (TRUE METRIX METER) w/Device KIT USE AS DIRECTED   calcium-vitamin D (OSCAL WITH D) 500-200 MG-UNIT tablet Take 2 tablets by mouth daily.   estradiol (ESTRACE VAGINAL) 0.1 MG/GM vaginal cream Insert 0.5 g every other day by vaginal route.   glipiZIDE (GLUCOTROL XL) 5 MG 24 hr tablet Take 1 tablet (5 mg total) by mouth daily.   glucose blood (TRUE METRIX BLOOD GLUCOSE TEST) test strip Use to check blood sugar once daily. Diagnosis code E11.9   hydrochlorothiazide (HYDRODIURIL) 25 MG tablet    levothyroxine (SYNTHROID) 50 MCG tablet    levothyroxine (SYNTHROID) 75 MCG tablet Take 1 tablet (75 mcg total) by mouth daily.   lisinopril (ZESTRIL) 10 MG tablet Take 1 tablet (10 mg total) by mouth daily.   magnesium 30 MG tablet Take 30 mg by mouth 2 (two) times daily.   metFORMIN (GLUCOPHAGE) 1000 MG tablet Take 1 tablet (1,000 mg total) by mouth 2 (two) times daily with a meal.   predniSONE (DELTASONE) 20 MG tablet Take 1 tablet (20 mg total) by mouth daily with breakfast.   traMADol (ULTRAM) 50 MG tablet TAKE 1 TABLET BY MOUTH EVERY 4 HOURS AS NEEDED FOR UP TO 5 DAYS   TRUEplus Lancets 30G MISC Use to check blood sugar once daily. Diagnosis code E11.9   No facility-administered encounter medications on file as of 04/27/2022.    Past Medical History:  Diagnosis Date   Complication of anesthesia    Diabetes mellitus without complication ([place])    Diverticulosis 08/2018   partial colectomy   Family  history of adverse reaction to anesthesia    mom is slow to wake up   Hypertension    Hypothyroidism    Irritable bowel    Obesity    PONV (postoperative nausea and vomiting)     Past Surgical History:  Procedure Laterality Date   CATARACT EXTRACTION, BILATERAL  2017   COLON SURGERY  08/2018   partial colectomy for diverticulosis   COLONOSCOPY N/A 07/12/2016   Procedure: COLONOSCOPY;  Surgeon: Wonda Horner, MD;  Location: [facility];  Service: Endoscopy;  Laterality: N/A;   HYSTEROSCOPY W/ ENDOMETRIAL ABLATION     SHOULDER ARTHROSCOPY WITH DISTAL CLAVICLE RESECTION Right 01/31/2019   Procedure: SHOULDER ARTHROSCOPY WITH DISTAL CLAVICLE RESECTION;  Surgeon: Earlie Server, MD;  Location: [facility];  Service: Orthopedics;  Laterality: Right;   SHOULDER ARTHROSCOPY WITH SUBACROMIAL DECOMPRESSION Right 01/31/2019   Procedure: SHOULDER ARTHROSCOPY WITH SUBACROMIAL DECOMPRESSION;  Surgeon: Earlie Server, MD;  Location: [facility];  Service: Orthopedics;  Laterality: Right;   TUBAL LIGATION     WISDOM TOOTH EXTRACTION      Family History  Problem Relation Age of Onset   Diabetes Mother    Breast cancer Mother    Heart disease Father    Cancer Brother    Breast cancer Sister     Social History  Socioeconomic History   Marital status: Married    Spouse name: Not on file   Number of children: Not on file   Years of education: Not on file   Highest education level: Not on file  Occupational History   Not on file  Tobacco Use   Smoking status: Former    Packs/day: 2.00    Years: 27.00    Total pack years: 54.00    Types: Cigarettes    Quit date: 01/10/2013    Years since quitting: 9.2   Smokeless tobacco: Never  Vaping Use   Vaping Use: Never used  Substance and Sexual Activity   Alcohol use: No    Alcohol/week: 0.0 standard drinks of alcohol   Drug use: No   Sexual activity: Yes    Birth control/protection: None, Post-menopausal   Other Topics Concern   Not on file  Social History Narrative   Not on file   Social Determinants of Health   Financial Resource Strain: Not on file  Food Insecurity: Not on file  Transportation Needs: Not on file  Physical Activity: Not on file  Stress: Not on file  Social Connections: Not on file  Intimate Partner Violence: Not on file    ROS      Objective    BP 133/86   Pulse 88   Temp 98.1 F (36.7 C) (Oral)   Resp 16   Wt 193 lb 6.4 oz (87.7 kg)   SpO2 92%   BMI 35.37 kg/m   Physical Exam  {Labs (Optional):23779}    Assessment & Plan:   Problem List Items Addressed This Visit       Endocrine   Type 2 diabetes mellitus ([place]) - Primary (Chronic)   Relevant Orders   Hemoglobin A1c    No follow-ups on file.   Becky Sax, MD

## 2022-04-28 LAB — HEMOGLOBIN A1C
Est. average glucose Bld gHb Est-mCnc: 134 mg/dL
Hgb A1c MFr Bld: 6.3 % — ABNORMAL HIGH (ref 4.8–5.6)

## 2022-04-29 ENCOUNTER — Encounter: Payer: Self-pay | Admitting: Family Medicine

## 2022-05-04 ENCOUNTER — Ambulatory Visit: Payer: Self-pay | Admitting: *Deleted

## 2022-05-04 NOTE — Telephone Encounter (Signed)
  Chief Complaint: Back pain not  helped with the gabapentin.   Requesting prednisone.   That has helped in the past. Pertinent Negatives: Patient denies her back feeling any better on the Gabapentin Disposition: '[]'$ ED /'[]'$ Urgent Care (no appt availability in office) / '[]'$ Appointment(In office/virtual)/ '[]'$  [facility]/ '[]'$ Home Care/ '[]'$ Refused Recommended Disposition /'[]'$ [facility]/ '[x]'$  Follow-up with PCP Additional Notes: Message sent to Dr. Redmond Pulling with pt's request for prednisone.

## 2022-05-04 NOTE — Telephone Encounter (Signed)
Message from Shan Levans sent at 05/04/2022  2:37 PM EDT  Summary: back pain   Patient stated that the gabapentin (NEURONTIN) 300 MG capsule is not working for her back pain and wants to know if she can have a steroid to help. Please advise.           Call History   Type Contact Phone/Fax User  05/04/2022 02:33 PM EDT Phone (Incoming) Yvonnie, Schinke (Self) [phone] Luellen Pucker    Reason for Disposition  [1] MODERATE back pain (e.g., interferes with normal activities) AND [2] present > 3 days    Gabapentin is not helping at all with the back pain.   Will Dr. Redmond Pulling order some prednisone?  That really helps.  Answer Assessment - Initial Assessment Questions 1. ONSET: "When did the pain begin?"      The gabapentin is not helping with the back pain.    I'm wondering if Dr. Redmond Pulling would be willing to call in some steroid for my back.   I've taken that before and it really helped.   Prednisone. She said it was inflammation causing my back to hurt when she prescribed the gabapentin but it isn't helping at all that I can tell.   The pain is worse in the mornings.  I take the gabapentin at night as she directed. 2. LOCATION: "Where does it hurt?" (upper, mid or lower back)     Back pain not improved with the gabapentin 3. SEVERITY: "How bad is the pain?"  (e.g., Scale 1-10; mild, moderate, or severe)   - MILD (1-3): Doesn't interfere with normal activities.    - MODERATE (4-7): Interferes with normal activities or awakens from sleep.    - SEVERE (8-10): Excruciating pain, unable to do any normal activities.      Moderate 4. PATTERN: "Is the pain constant?" (e.g., yes, no; constant, intermittent)      Worse in the mornings 5. RADIATION: "Does the pain shoot into your legs or somewhere else?"     Not asked since she has been evaluated for this pain. 6. CAUSE:  "What do you think is causing the back pain?"      Dr. Redmond Pulling said it was inflammation 7. BACK OVERUSE:  "Any recent  lifting of heavy objects, strenuous work or exercise?"     Not asked 8. MEDICINES: "What have you taken so far for the pain?" (e.g., nothing, acetaminophen, NSAIDS)     Gabapentin but it's not helping at all. 9. NEUROLOGIC SYMPTOMS: "Do you have any weakness, numbness, or problems with bowel/bladder control?"     Not asked 10. OTHER SYMPTOMS: "Do you have any other symptoms?" (e.g., fever, abdomen pain, burning with urination, blood in urine)       Not asked 11. PREGNANCY: "Is there any chance you are pregnant?" "When was your last menstrual period?"       N/A  Protocols used: Back Pain-A-AH

## 2022-05-05 NOTE — Telephone Encounter (Signed)
I have attempted without success to contact this patient by phone to return their call and I left a message on answering machine.  Patient was advise to go to UC if the pain gets any worst throughout weekend    Please advise patient Dr.Wilson

## 2022-05-26 NOTE — Telephone Encounter (Signed)
Pt called back, she states that she never received call back. Pt states that she is still in a lot of pain with back. She is unable to take medication during the day d/t sleepiness so she takes it at night. I advised her of UC since no appts until 06/07/22. Pt was asking if Dr. Redmond Pulling would call in prednisone but I advised her she would need an appt. Pt was unsure if insurance would cover UC or not. Pt was going to call UC and provided with phone #. Advised pt to call back if she needed to schedule OV or not. Pt verbalized understanding.

## 2022-05-28 ENCOUNTER — Ambulatory Visit: Payer: Medicare PPO

## 2022-05-29 ENCOUNTER — Other Ambulatory Visit: Payer: Self-pay | Admitting: Family Medicine

## 2022-05-29 ENCOUNTER — Ambulatory Visit: Payer: Self-pay | Admitting: *Deleted

## 2022-05-29 MED ORDER — TRAMADOL HCL 50 MG PO TABS: ORAL_TABLET | ORAL | 0 refills | Status: DC

## 2022-05-29 NOTE — Telephone Encounter (Signed)
Patient is aware of medication sent in for back

## 2022-05-29 NOTE — Telephone Encounter (Signed)
Pleas advse patient patient

## 2022-05-29 NOTE — Telephone Encounter (Signed)
  Chief Complaint: back pain Symptoms: radiates down legs Frequency: almost constant Pertinent Negatives: Patient denies injury Disposition: '[]'$ ED /'[]'$ Urgent Care (no appt availability in office) / '[x]'$ Appointment(In office/virtual)/ '[]'$  [facility]/ '[]'$ Home Care/ '[]'$ Refused Recommended Disposition /'[]'$ [facility]/ '[]'$  Follow-up with PCP Additional Notes: Pt has appt for Friday (and on wait list) but Neurontin she was prescribed for pain gave her severe uncontrollable diarrhea so she had to stop it. She is requesting something else for pain be sent in to make it until Friday's appt.  Reason for Disposition  [1] Pain radiates into the thigh or further down the leg AND [2] one leg  Answer Assessment - Initial Assessment Questions 1. ONSET: "When did the pain begin?"      Weeks ago 2. LOCATION: "Where does it hurt?" (upper, mid or lower back)     Lower back and down leg 3. SEVERITY: "How bad is the pain?"  (e.g., Scale 1-10; mild, moderate, or severe)   - MILD (1-3): Doesn't interfere with normal activities.    - MODERATE (4-7): Interferes with normal activities or awakens from sleep.    - SEVERE (8-10): Excruciating pain, unable to do any normal activities.      Sometimes 10, today 3 right now 4. PATTERN: "Is the pain constant?" (e.g., yes, no; constant, intermittent)      no 5. RADIATION: "Does the pain shoot into your legs or somewhere else?"     Yes, legs 6. CAUSE:  "What do you think is causing the back pain?"      sciatica 7. BACK OVERUSE:  "Any recent lifting of heavy objects, strenuous work or exercise?"     no 8. MEDICINES: "What have you taken so far for the pain?" (e.g., nothing, acetaminophen, NSAIDS)     Neurontin is causing diarrhea, can not take it 9. NEUROLOGIC SYMPTOMS: "Do you have any weakness, numbness, or problems with bowel/bladder control?"     Yes both bowel and bladder 10. OTHER SYMPTOMS: "Do you have any other symptoms?" (e.g., fever, abdomen  pain, burning with urination, blood in urine)       No fever, no blood 11. PREGNANCY: "Is there any chance you are pregnant?" "When was your last menstrual period?"       na  Protocols used: Back Pain-A-AH

## 2022-05-29 NOTE — Telephone Encounter (Signed)
Please advise this patient 

## 2022-05-30 NOTE — Progress Notes (Unsigned)
Patient ID: Laurie Michael, female    DOB: September 14, 1950  MRN: [id number]  CC: Back Pain Follow-Up  Subjective: Laurie Michael is a 71 y.o. female who presents for back pain follow-up.  Her concerns today include:  04/27/2022 with Redmond Pulling Neurontin prescribed. Monitor  Trial Prednisone   Patient Active Problem List   Diagnosis Date Noted   Right foot pain 08/25/2020   History of open sigmoidectomy 09/03/2018   S/P colectomy 08/22/2018   Polyarthralgia 05/08/2018   History of colon polyps 03/28/2018   Osteopenia 01/23/2018   Estrogen deficiency 07/17/2017   Hypothyroidism 01/30/2017   Carpal tunnel syndrome, bilateral 01/30/2017   Obesity 01/30/2017   Dental calculus 08/25/2016   Hyperlipidemia associated with type 2 diabetes mellitus ([place]) 05/02/2016   Cataract extraction status 04/19/2016   Essential hypertension 01/12/2016   Type 2 diabetes mellitus ([place]) 01/12/2016     Current Outpatient Medications on File Prior to Visit  Medication Sig Dispense Refill   Ascorbic Acid (VITAMIN C) 1000 MG tablet Take 1,000 mg by mouth daily.     atorvastatin (LIPITOR) 80 MG tablet TAKE 1 TABLET EVERY DAY AT 6 PM 90 tablet 1   Blood Glucose Monitoring Suppl (TRUE METRIX METER) w/Device KIT USE AS DIRECTED 1 kit 0   calcium-vitamin D (OSCAL WITH D) 500-200 MG-UNIT tablet Take 2 tablets by mouth daily.     estradiol (ESTRACE VAGINAL) 0.1 MG/GM vaginal cream Insert 0.5 g every other day by vaginal route.     gabapentin (NEURONTIN) 300 MG capsule Take 1 capsule (300 mg total) by mouth 3 (three) times daily. 90 capsule 1   glucose blood (TRUE METRIX BLOOD GLUCOSE TEST) test strip Use to check blood sugar once daily. Diagnosis code E11.9 100 each 2   hydrochlorothiazide (HYDRODIURIL) 25 MG tablet      levothyroxine (SYNTHROID) 50 MCG tablet      levothyroxine (SYNTHROID) 75 MCG tablet Take 1 tablet (75 mcg total) by mouth daily. 90 tablet 0   lisinopril (ZESTRIL) 10 MG tablet Take 1 tablet (10  mg total) by mouth daily. 90 tablet 3   magnesium 30 MG tablet Take 30 mg by mouth 2 (two) times daily.     metFORMIN (GLUCOPHAGE) 1000 MG tablet Take 1 tablet (1,000 mg total) by mouth 2 (two) times daily with a meal. 180 tablet 0   predniSONE (DELTASONE) 20 MG tablet Take 1 tablet (20 mg total) by mouth daily with breakfast. 10 tablet 0   traMADol (ULTRAM) 50 MG tablet TAKE 1 TABLET BY MOUTH EVERY 4 HOURS AS NEEDED FOR UP TO 5 DAYS 20 tablet 0   TRUEplus Lancets 30G MISC Use to check blood sugar once daily. Diagnosis code E11.9 100 each 2   No current facility-administered medications on file prior to visit.    No Known Allergies  Social History   Socioeconomic History   Marital status: Married    Spouse name: Not on file   Number of children: Not on file   Years of education: Not on file   Highest education level: Not on file  Occupational History   Not on file  Tobacco Use   Smoking status: Former    Packs/day: 2.00    Years: 27.00    Total pack years: 54.00    Types: Cigarettes    Quit date: 01/10/2013    Years since quitting: 9.3    Passive exposure: Past   Smokeless tobacco: Never  Vaping Use   Vaping Use: Never  used  Substance and Sexual Activity   Alcohol use: No    Alcohol/week: 0.0 standard drinks of alcohol   Drug use: No   Sexual activity: Yes    Birth control/protection: None, Post-menopausal  Other Topics Concern   Not on file  Social History Narrative   Not on file   Social Determinants of Health   Financial Resource Strain: Not on file  Food Insecurity: Not on file  Transportation Needs: Not on file  Physical Activity: Not on file  Stress: Not on file  Social Connections: Not on file  Intimate Partner Violence: Not on file    Family History  Problem Relation Age of Onset   Diabetes Mother    Breast cancer Mother    Heart disease Father    Cancer Brother    Breast cancer Sister     Past Surgical History:  Procedure Laterality Date    CATARACT EXTRACTION, BILATERAL  2017   COLON SURGERY  08/2018   partial colectomy for diverticulosis   COLONOSCOPY N/A 07/12/2016   Procedure: COLONOSCOPY;  Surgeon: Wonda Horner, MD;  Location[facility] ENDOSCOPY;  Service: Endoscopy;  Laterality: N/A;   HYSTEROSCOPY W/ ENDOMETRIAL ABLATION     SHOULDER ARTHROSCOPY WITH DISTAL CLAVICLE RESECTION Right 01/31/2019   Procedure: SHOULDER ARTHROSCOPY WITH DISTAL CLAVICLE RESECTION;  Surgeon: Earlie Server, MD;  Location: [facility];  Service: Orthopedics;  Laterality: Right;   SHOULDER ARTHROSCOPY WITH SUBACROMIAL DECOMPRESSION Right 01/31/2019   Procedure: SHOULDER ARTHROSCOPY WITH SUBACROMIAL DECOMPRESSION;  Surgeon: Earlie Server, MD;  Location: [place];  Service: Orthopedics;  Laterality: Right;   TUBAL LIGATION     WISDOM TOOTH EXTRACTION      ROS: Review of Systems Negative except as stated above  PHYSICAL EXAM: BP 116/76 (BP Location: Right Arm, Patient Position: Sitting, Cuff Size: Large)   Pulse 88   Temp 98.3 F (36.8 C)   Resp 16   Ht 5' 2.01" (1.575 m)   Wt 192 lb (87.1 kg)   SpO2 91%   BMI 35.11 kg/m   Physical Exam  {female adult master:310786} {female adult master:310785}     Latest Ref Rng & Units 12/07/2020    3:51 PM 05/24/2020    2:14 PM 01/16/2019    2:37 PM  CMP  Glucose 65 - 99 mg/dL 136  123    BUN 8 - 27 mg/dL 14  19    Creatinine 0.57 - 1.00 mg/dL 0.71  0.61    Sodium 134 - 144 mmol/L 141  140  139   Potassium 3.5 - 5.2 mmol/L 5.1  4.5    Chloride 96 - 106 mmol/L 102  102    CO2 20 - 29 mmol/L 25  26    Calcium 8.7 - 10.3 mg/dL 9.7  9.3    Total Protein 6.0 - 8.5 g/dL 7.7  7.3    Total Bilirubin 0.0 - 1.2 mg/dL 0.3  0.3    Alkaline Phos 44 - 121 IU/L 194  235    AST 0 - 40 IU/L 16  18    ALT 0 - 32 IU/L 18  19     Lipid Panel     Component Value Date/Time   CHOL 168 12/07/2020 1551   TRIG 197 (H) 12/07/2020 1551   HDL 57 12/07/2020 1551   CHOLHDL 2.9 12/07/2020  1551   LDLCALC 78 12/07/2020 1551   LDLDIRECT 79 10/24/2016 1439    CBC    Component Value  Date/Time   WBC 10.7 05/24/2020 1414   RBC 4.53 05/24/2020 1414   HGB 12.8 05/24/2020 1414   HCT 39.6 05/24/2020 1414   PLT 406 05/24/2020 1414   MCV 87 05/24/2020 1414   MCH 28.3 05/24/2020 1414   MCHC 32.3 05/24/2020 1414   RDW 13.1 05/24/2020 1414   LYMPHSABS 2.8 05/24/2020 1414   EOSABS 0.2 05/24/2020 1414   BASOSABS 0.1 05/24/2020 1414    ASSESSMENT AND PLAN:  There are no diagnoses linked to this encounter.   Patient was given the opportunity to ask questions.  Patient verbalized understanding of the plan and was able to repeat key elements of the plan. Patient was given clear instructions to go to Emergency Department or return to medical center if symptoms don't improve, worsen, or new problems develop.The patient verbalized understanding.   No orders of the defined types were placed in this encounter.    Requested Prescriptions    No prescriptions requested or ordered in this encounter    No follow-ups on file.  Camillia Herter, NP

## 2022-06-02 ENCOUNTER — Ambulatory Visit (INDEPENDENT_AMBULATORY_CARE_PROVIDER_SITE_OTHER): Payer: Medicare PPO

## 2022-06-02 ENCOUNTER — Encounter: Payer: Self-pay | Admitting: Family

## 2022-06-02 ENCOUNTER — Ambulatory Visit (INDEPENDENT_AMBULATORY_CARE_PROVIDER_SITE_OTHER): Payer: Medicare PPO | Admitting: Family

## 2022-06-02 ENCOUNTER — Other Ambulatory Visit: Payer: Self-pay

## 2022-06-02 VITALS — BP 116/76 | HR 88 | Temp 98.3°F | Resp 16 | Ht 62.01 in | Wt 192.0 lb

## 2022-06-02 DIAGNOSIS — G5603 Carpal tunnel syndrome, bilateral upper limbs: Secondary | ICD-10-CM | POA: Diagnosis not present

## 2022-06-02 DIAGNOSIS — M5442 Lumbago with sciatica, left side: Secondary | ICD-10-CM

## 2022-06-02 DIAGNOSIS — M5441 Lumbago with sciatica, right side: Secondary | ICD-10-CM

## 2022-06-02 DIAGNOSIS — E119 Type 2 diabetes mellitus without complications: Secondary | ICD-10-CM

## 2022-06-02 DIAGNOSIS — G8929 Other chronic pain: Secondary | ICD-10-CM

## 2022-06-02 DIAGNOSIS — M545 Low back pain, unspecified: Secondary | ICD-10-CM | POA: Diagnosis not present

## 2022-06-02 MED ORDER — PREDNISONE 20 MG PO TABS: 20.0000 mg | ORAL_TABLET | Freq: Every day | ORAL | 0 refills | Status: AC

## 2022-06-02 MED ORDER — GLIPIZIDE ER 5 MG PO TB24: 5.0000 mg | ORAL_TABLET | Freq: Every day | ORAL | 0 refills | Status: DC

## 2022-06-02 NOTE — Progress Notes (Unsigned)
Pt presents for back pain -stopped Gabapentin states it gave her diarrhea -started Tramadol on 10/16 states it has helped some  -wants Xray of lower back

## 2022-06-05 ENCOUNTER — Other Ambulatory Visit: Payer: Self-pay | Admitting: Family

## 2022-06-05 DIAGNOSIS — M47814 Spondylosis without myelopathy or radiculopathy, thoracic region: Secondary | ICD-10-CM

## 2022-06-05 DIAGNOSIS — M47816 Spondylosis without myelopathy or radiculopathy, lumbar region: Secondary | ICD-10-CM

## 2022-06-06 ENCOUNTER — Other Ambulatory Visit: Payer: Self-pay | Admitting: Family Medicine

## 2022-06-06 ENCOUNTER — Telehealth: Payer: Self-pay

## 2022-06-06 DIAGNOSIS — E039 Hypothyroidism, unspecified: Secondary | ICD-10-CM

## 2022-06-06 NOTE — Telephone Encounter (Signed)
Patient requesting Tramadol refill, she says this works for her and she would like a refill until she can see the specialist.

## 2022-06-06 NOTE — Telephone Encounter (Signed)
Pt given imaging results per notes of Durene Fruits, NP on 06/05/22. Pt verbalized understanding. She says she was going to wait on the referral due to she may be changing insurances and she will know by December 7th. I advised to call her current insurance to see which orthopedic is in her network and how much they charge as a co-pay, because with her being evaluated and diagnosed already by provider, it may be considered pre-existing with the new insurance and some may not pay. She says she will call and find out.      Camillia Herter, NP  06/05/2022  4:22 PM EDT     Degenerative joint changes thoracic and lumbar spine. Referral to Orthopedics for further evaluation/management. Expect call within 2 weeks with appointment details.

## 2022-06-06 NOTE — Telephone Encounter (Signed)
Noted  

## 2022-06-06 NOTE — Telephone Encounter (Signed)
Medication Refill - Medication: levothyroxine (SYNTHROID) 75 MCG tablet  Has the patient contacted their pharmacy? Yes.   (Agent: If no, request that the patient contact the pharmacy for the refill. If patient does not wish to contact the pharmacy document the reason why and proceed with request.) (Agent: If yes, when and what did the pharmacy advise?)  Preferred Pharmacy (with phone number or street name):  [facility] ([address] [place] [address]  [address] [place]) [place] [address]  Phone: [phone] Fax: [phone]  Hours: Not open 24 hours   Has the patient been seen for an appointment in the last year OR does the patient have an upcoming appointment? Yes.    Agent: Please be advised that RX refills may take up to 3 business days. We ask that you follow-up with your pharmacy.

## 2022-06-07 MED ORDER — LEVOTHYROXINE SODIUM 75 MCG PO TABS: 75.0000 ug | ORAL_TABLET | Freq: Every day | ORAL | 0 refills | Status: DC

## 2022-06-07 NOTE — Telephone Encounter (Signed)
Requested Prescriptions  Pending Prescriptions Disp Refills  . traMADol (ULTRAM) 50 MG tablet 20 tablet 0    Sig: TAKE 1 TABLET BY MOUTH EVERY 4 HOURS AS NEEDED FOR UP TO 5 DAYS     Not Delegated - Analgesics:  Opioid Agonists Failed - 06/06/2022  3:46 PM      Failed - This refill cannot be delegated      Failed - Urine Drug Screen completed in last 360 days      Passed - Valid encounter within last 3 months    Recent Outpatient Visits          5 days ago Chronic bilateral low back pain with bilateral sciatica   [facility], Amy J, NP   1 month ago Type 2 diabetes mellitus without complication, without long-term current use of insulin [facility])   Primary [facility] at [facility], Clyde Canterbury, MD   8 months ago Carpal tunnel syndrome, bilateral   [facility], Cari S, PA-C   8 months ago Type 2 diabetes mellitus without complication, without long-term current use of insulin [facility])   Primary Care at [facility], Clyde Canterbury, MD   1 year ago Type 2 diabetes mellitus without complication, without long-term current use of insulin [facility])   Primary Care at [facility], MD      Future Appointments            In 4 months Dorna Mai, MD Primary Care at [facility]           . levothyroxine (SYNTHROID) 75 MCG tablet 90 tablet 0    Sig: Take 1 tablet (75 mcg total) by mouth daily.     Endocrinology:  Hypothyroid Agents Failed - 06/06/2022  3:46 PM      Failed - TSH in normal range and within 360 days    TSH  Date Value Ref Range Status  12/07/2020 1.180 0.450 - 4.500 uIU/mL Final         Passed - Valid encounter within last 12 months    Recent Outpatient Visits          5 days ago Chronic bilateral low back pain with bilateral sciatica   [facility] at [facility], Amy J, NP   1 month ago Type 2 diabetes mellitus without complication, without long-term current use of insulin [facility])    Primary Care at [facility], MD   8 months ago Carpal tunnel syndrome, bilateral   Primary Care at [facility], Cari S, PA-C   8 months ago Type 2 diabetes mellitus without complication, without long-term current use of insulin [facility])   Primary Care at [facility], Clyde Canterbury, MD   1 year ago Type 2 diabetes mellitus without complication, without long-term current use of insulin [facility])   Primary Care at [facility], MD      Future Appointments            In 4 months Dorna Mai, MD Primary Care at [facility]

## 2022-06-07 NOTE — Telephone Encounter (Signed)
Requested medication (s) are due for refill today: yes  Requested medication (s) are on the active medication list:yes  Last refill:  05/31/22  Future visit scheduled: yes  Notes to clinic:  Unable to refill per protocol, cannot delegate.      Requested Prescriptions  Pending Prescriptions Disp Refills   traMADol (ULTRAM) 50 MG tablet 20 tablet 0    Sig: TAKE 1 TABLET BY MOUTH EVERY 4 HOURS AS NEEDED FOR UP TO 5 DAYS     Not Delegated - Analgesics:  Opioid Agonists Failed - 06/06/2022  3:46 PM      Failed - This refill cannot be delegated      Failed - Urine Drug Screen completed in last 360 days      Passed - Valid encounter within last 3 months    Recent Outpatient Visits           5 days ago Chronic bilateral low back pain with bilateral sciatica   Primary Care at [facility], Amy J, NP   1 month ago Type 2 diabetes mellitus without complication, without long-term current use of insulin [facility])   Primary Care at [facility], Clyde Canterbury, MD   8 months ago Carpal tunnel syndrome, bilateral   Primary Care at [facility], Cari S, PA-C   8 months ago Type 2 diabetes mellitus without complication, without long-term current use of insulin [facility])   Primary Care at [facility], Clyde Canterbury, MD   1 year ago Type 2 diabetes mellitus without complication, without long-term current use of insulin [facility])   Primary Care at [facility], MD       Future Appointments             In 4 months Dorna Mai, MD Primary Care at [facility]            Signed Prescriptions Disp Refills   levothyroxine (SYNTHROID) 75 MCG tablet 90 tablet 0    Sig: Take 1 tablet (75 mcg total) by mouth daily.     Endocrinology:  Hypothyroid Agents Failed - 06/06/2022  3:46 PM      Failed - TSH in normal range and within 360 days    TSH  Date Value Ref Range Status  12/07/2020 1.180 0.450 - 4.500 uIU/mL Final         Passed - Valid encounter  within last 12 months    Recent Outpatient Visits           5 days ago Chronic bilateral low back pain with bilateral sciatica   Primary Care at [facility], Amy J, NP   1 month ago Type 2 diabetes mellitus without complication, without long-term current use of insulin [facility])   Primary Care at [facility], MD   8 months ago Carpal tunnel syndrome, bilateral   Primary Care at [facility], Cari S, PA-C   8 months ago Type 2 diabetes mellitus without complication, without long-term current use of insulin [facility])   Primary Care at [facility], Clyde Canterbury, MD   1 year ago Type 2 diabetes mellitus without complication, without long-term current use of insulin [facility])   Primary Care at [facility], MD       Future Appointments             In 4 months Dorna Mai, MD Primary Care at [facility]

## 2022-06-15 ENCOUNTER — Ambulatory Visit: Payer: Medicare PPO | Admitting: Orthopedic Surgery

## 2022-06-19 ENCOUNTER — Ambulatory Visit: Payer: Medicare PPO | Admitting: Orthopedic Surgery

## 2022-06-21 ENCOUNTER — Encounter: Payer: Self-pay | Admitting: Orthopedic Surgery

## 2022-06-21 ENCOUNTER — Other Ambulatory Visit (HOSPITAL_COMMUNITY): Payer: Self-pay

## 2022-06-21 ENCOUNTER — Ambulatory Visit (INDEPENDENT_AMBULATORY_CARE_PROVIDER_SITE_OTHER): Payer: Medicare PPO

## 2022-06-21 ENCOUNTER — Ambulatory Visit (INDEPENDENT_AMBULATORY_CARE_PROVIDER_SITE_OTHER): Payer: Medicare PPO | Admitting: Orthopedic Surgery

## 2022-06-21 VITALS — BP 130/82 | HR 90 | Ht 62.0 in | Wt 192.0 lb

## 2022-06-21 DIAGNOSIS — M545 Low back pain, unspecified: Secondary | ICD-10-CM

## 2022-06-21 MED ORDER — MELOXICAM 15 MG PO TABS
15.0000 mg | ORAL_TABLET | Freq: Every day | ORAL | 0 refills | Status: DC
  Filled 2022-06-21: qty 42, 42d supply, fill #0

## 2022-06-21 NOTE — Progress Notes (Signed)
Orthopedic Spine Surgery Office Note  Assessment: Patient is a 71 y.o. female with lumbar degenerative scoliosis and radicular pain down bilateral legs, worse on the right.   Plan: -Explained that initially conservative treatment is tried as a significant number of patients may experience relief with these treatment modalities. Discussed that the conservative treatments include:  -activity modification  -physical therapy  -over the counter pain medications  -lumbar steroid injections -Patient has tried tramadol, activity modification -Recommended she try physical therapy, core strengthening, meloxicam and Tylenol.  I told her to discontinue using the ibuprofen while she is taking the meloxicam.  Meloxicam was prescribed to her today. -I told her to discontinue use of the tramadol -We discussed weight loss and core strengthening as long-term treatments to help decrease her pain -If she does not improve with this conservative treatment after 6 weeks, will recommend MRI of the lumbar spine without contrast -Patient should return to office in 6 weeks, repeat x-rays of lumbar spine at next visit: scoliosis films   Patient expressed understanding of the plan and all questions were answered to the patient's satisfaction.   ___________________________________________________________________________   History:  Patient is a 71 y.o. female who presents today for lumbar spine.  Patient has had several months of low back pain that radiates into her bilateral legs.  There was no trauma or injury that brought on the pain.  Pain is felt down the posterior aspect of the thighs.  She has also noticed decrease sensation in her right anterior thigh.  No other numbness or paresthesias.   Weakness: Yes, feels her legs are weaker.  No other weakness Symptoms of imbalance: Yes, gets dizzy.  But no feelings of unsteadiness with gait Paresthesias and numbness: Yes, decree sensation in her right anterior thigh.   No other numbness or paresthesias Bowel or bladder incontinence: Denies Saddle anesthesia: Denies  Treatments tried: Activity modification, tramadol  Review of systems: Denies fevers and chills, night sweats, unexplained weight loss, history of cancer, pain that wakes them at night  Past medical history: Diabetes Hyperlipidemia Depression Hypertension Insomnia Hypothyroidism  Allergies: NKDA  Past surgical history:  Stomach surgeries Right shoulder arthroscopy with distal clavicle resection and subacromial decompression Hysterectomy Cataract surgery Tubal ligation Wisdom tooth extraction  Social history: Denies use of nicotine product (smoking, vaping, patches, smokeless) Alcohol use: Denies Denies recreational drug use   Physical Exam:  General: no acute distress, appears stated age Neurologic: alert, answering questions appropriately, following commands Respiratory: unlabored breathing on room air, symmetric chest rise Psychiatric: appropriate affect, normal cadence to speech   MSK (spine):  -Strength exam      Left  Right EHL    5/5  5/5 TA    5/5  5/5 GSC    5/5  5/5 Knee extension  5/5  5/5 Hip flexion   5/5  5/5  -Sensory exam    Sensation intact to light touch in L3-S1 nerve distributions of bilateral lower extremities  -Achilles DTR: 1/4 on the left, 1/4 on the right -Patellar tendon DTR: 2/4 on the left, 2/4 on the right  -Straight leg raise: Negative -Contralateral straight leg raise: Negative -Clonus: no beats bilaterally -Negative Hoffmann bilaterally  -Left hip exam: No pain through range of motion, negative Stinchfield -Right hip exam: No pain through range of motion, negative Stinchfield  Imaging: XR of the lumbar spine from 06/02/2022 and 06/21/2022 was independently reviewed and interpreted, showing lumbar degenerative scoliosis with lateral listhesis at L2/3. Apex of curvature is at L2.  Degenerative changes at multiple levels. No  fracture or dislocation seen. No evidence of instability on flexion/extension.    Patient name: Laurie Michael Patient MRN: [id number] Date of visit: 06/21/22

## 2022-06-22 ENCOUNTER — Other Ambulatory Visit (HOSPITAL_COMMUNITY): Payer: Self-pay

## 2022-06-29 ENCOUNTER — Telehealth: Payer: Self-pay | Admitting: Orthopedic Surgery

## 2022-06-29 NOTE — Telephone Encounter (Signed)
Patient would like the meds that was sent to [place] sent to  [facility] [address] she does not the name of meds but they are from her recent visit  on 11/9

## 2022-06-30 ENCOUNTER — Other Ambulatory Visit (HOSPITAL_COMMUNITY): Payer: Self-pay

## 2022-06-30 MED ORDER — MELOXICAM 15 MG PO TABS: 15.0000 mg | ORAL_TABLET | Freq: Every day | ORAL | 0 refills | Status: AC

## 2022-06-30 NOTE — Telephone Encounter (Signed)
FYI  I spoke with Solmon Ice at [facility]. Patient has not picked up rx. I asked her to discontinue that prescription. Prescription then sent to [facility] on [facility] per patient request. Patient aware.

## 2022-08-02 ENCOUNTER — Ambulatory Visit: Payer: Medicare PPO | Admitting: Orthopedic Surgery

## 2022-08-24 ENCOUNTER — Ambulatory Visit: Payer: Medicare PPO

## 2022-08-28 ENCOUNTER — Other Ambulatory Visit: Payer: Self-pay | Admitting: Family Medicine

## 2022-08-28 DIAGNOSIS — E119 Type 2 diabetes mellitus without complications: Secondary | ICD-10-CM

## 2022-08-28 NOTE — Telephone Encounter (Signed)
Medication Refill - Medication: metFORMIN (GLUCOPHAGE) 1000 MG tablet [[id number]]   glipiZIDE (GLUCOTROL XL) 5 MG 24 hr tablet [[id number]]   Pt is calling to report that she is out of metformin and has been out since Saturday  Has the patient contacted their pharmacy? Yes.   (Agent: If no, request that the patient contact the pharmacy for the refill. If patient does not wish to contact the pharmacy document the reason why and proceed with request.) (Agent: If yes, when and what did the pharmacy advise?)  Preferred Pharmacy (with phone number or street name): [facility] ([address]), [place] [address] [place]) [place] [address] Phone: [phone]  Fax: [phone]  Has the patient been seen for an appointment in the last year OR does the patient have an upcoming appointment? Yes.    Agent: Please be advised that RX refills may take up to 3 business days. We ask that you follow-up with your pharmacy.

## 2022-08-29 NOTE — Telephone Encounter (Signed)
Requested medication (s) are due for refill today: yes  Requested medication (s) are on the active medication list: yes  Last refill:  metformin: 09/22/21 #180      glipizide: 06/02/22 #90  Future visit scheduled: yes  Notes to clinic:  overdue lab work   Requested Prescriptions  Pending Prescriptions Disp Refills   metFORMIN (GLUCOPHAGE) 1000 MG tablet 180 tablet 0    Sig: Take 1 tablet (1,000 mg total) by mouth 2 (two) times daily with a meal.     Endocrinology:  Diabetes - Biguanides Failed - 08/28/2022  3:51 PM      Failed - Cr in normal range and within 360 days    Creatinine, Ser  Date Value Ref Range Status  12/07/2020 0.71 0.57 - 1.00 mg/dL Final         Failed - eGFR in normal range and within 360 days    GFR calc Af Amer  Date Value Ref Range Status  05/24/2020 108 >59 mL/min/1.73 Final    Comment:    **[facility] currently reports eGFR in compliance with the current**   recommendations of the [employer]. [facility] will   update reporting as new guidelines are published from the NKF-ASN   Task force.    GFR calc non Af Amer  Date Value Ref Range Status  05/24/2020 93 >59 mL/min/1.73 Final   eGFR  Date Value Ref Range Status  12/07/2020 93 >59 mL/min/1.73 Final         Failed - B12 Level in normal range and within 720 days    No results found for: "VITAMINB12"       Failed - CBC within normal limits and completed in the last 12 months    WBC  Date Value Ref Range Status  05/24/2020 10.7 3.4 - 10.8 x10E3/uL Final   RBC  Date Value Ref Range Status  05/24/2020 4.53 3.77 - 5.28 x10E6/uL Final   Hemoglobin  Date Value Ref Range Status  05/24/2020 12.8 11.1 - 15.9 g/dL Final   Hematocrit  Date Value Ref Range Status  05/24/2020 39.6 34.0 - 46.6 % Final   MCHC  Date Value Ref Range Status  05/24/2020 32.3 31.5 - 35.7 g/dL Final   [facility]  Date Value Ref Range Status  05/24/2020 28.3 26.6 - 33.0 pg Final   MCV  Date Value Ref Range Status   05/24/2020 87 79 - 97 fL Final   No results found for: "PLTCOUNTKUC", "LABPLAT", "POCPLA" RDW  Date Value Ref Range Status  05/24/2020 13.1 11.7 - 15.4 % Final         Passed - HBA1C is between 0 and 7.9 and within 180 days    HbA1c, POC (controlled diabetic range)  Date Value Ref Range Status  05/24/2020 6.0 0.0 - 7.0 % Final   Hgb A1c MFr Bld  Date Value Ref Range Status  04/27/2022 6.3 (H) 4.8 - 5.6 % Final    Comment:             Prediabetes: 5.7 - 6.4          Diabetes: >6.4          Glycemic control for adults with diabetes: <7.0          Passed - Valid encounter within last 6 months    Recent Outpatient Visits           2 months ago Chronic bilateral low back pain with bilateral sciatica   Primary Care at [facility]  [place] [facility], Amy J, NP   4 months ago Type 2 diabetes mellitus without complication, without long-term current use of insulin [facility])   Primary Care at [facility], Clyde Canterbury, MD   10 months ago Carpal tunnel syndrome, bilateral   Primary Care at [facility], Cari S, PA-C   11 months ago Type 2 diabetes mellitus without complication, without long-term current use of insulin [facility])   Primary Care at [facility], Clyde Canterbury, MD   1 year ago Type 2 diabetes mellitus without complication, without long-term current use of insulin [facility])   Primary Care at [facility], MD       Future Appointments             In 1 month Dorna Mai, MD Primary Care at [facility]             glipiZIDE (GLUCOTROL XL) 5 MG 24 hr tablet 90 tablet 0    Sig: Take 1 tablet (5 mg total) by mouth daily.     Endocrinology:  Diabetes - Sulfonylureas Failed - 08/28/2022  3:51 PM      Failed - Cr in normal range and within 360 days    Creatinine, Ser  Date Value Ref Range Status  12/07/2020 0.71 0.57 - 1.00 mg/dL Final         Passed - HBA1C is between 0 and 7.9 and within 180 days    HbA1c, POC (controlled diabetic range)   Date Value Ref Range Status  05/24/2020 6.0 0.0 - 7.0 % Final   Hgb A1c MFr Bld  Date Value Ref Range Status  04/27/2022 6.3 (H) 4.8 - 5.6 % Final    Comment:             Prediabetes: 5.7 - 6.4          Diabetes: >6.4          Glycemic control for adults with diabetes: <7.0          Passed - Valid encounter within last 6 months    Recent Outpatient Visits           2 months ago Chronic bilateral low back pain with bilateral sciatica   Primary Care at [facility], Amy J, NP   4 months ago Type 2 diabetes mellitus without complication, without long-term current use of insulin [facility])   Primary Care at [facility], Clyde Canterbury, MD   10 months ago Carpal tunnel syndrome, bilateral   Primary Care at [facility], Cari S, PA-C   11 months ago Type 2 diabetes mellitus without complication, without long-term current use of insulin [facility])   Primary Care at [facility], Clyde Canterbury, MD   1 year ago Type 2 diabetes mellitus without complication, without long-term current use of insulin [facility])   Primary Care at [facility], MD       Future Appointments             In 1 month Dorna Mai, MD Primary Care at [facility]

## 2022-08-31 ENCOUNTER — Other Ambulatory Visit: Payer: Self-pay | Admitting: *Deleted

## 2022-08-31 ENCOUNTER — Other Ambulatory Visit: Payer: Self-pay | Admitting: Family Medicine

## 2022-08-31 DIAGNOSIS — E119 Type 2 diabetes mellitus without complications: Secondary | ICD-10-CM

## 2022-08-31 MED ORDER — GLIPIZIDE ER 5 MG PO TB24: 5.0000 mg | ORAL_TABLET | Freq: Every day | ORAL | 0 refills | Status: DC

## 2022-08-31 MED ORDER — METFORMIN HCL 1000 MG PO TABS: 1000.0000 mg | ORAL_TABLET | Freq: Two times a day (BID) | ORAL | 0 refills | Status: DC

## 2022-09-07 ENCOUNTER — Other Ambulatory Visit: Payer: Self-pay | Admitting: Family Medicine

## 2022-09-07 DIAGNOSIS — E039 Hypothyroidism, unspecified: Secondary | ICD-10-CM

## 2022-09-07 NOTE — Telephone Encounter (Signed)
Medication Refill - Medication: levothyroxine (SYNTHROID) 75 MCG tablet   Completely out of current supply   Has the patient contacted their pharmacy? Yes.   (Agent: If no, request that the patient contact the pharmacy for the refill. If patient does not wish to contact the pharmacy document the reason why and proceed with request.) (Agent: If yes, when and what did the pharmacy advise?)  Preferred Pharmacy (with phone number or street name):  [facility] ([address]), [place] [address]  [address] [place]) [place] [address]  Phone: [phone] Fax: [phone]   Has the patient been seen for an appointment in the last year OR does the patient have an upcoming appointment? Yes.    Agent: Please be advised that RX refills may take up to 3 business days. We ask that you follow-up with your pharmacy.

## 2022-09-08 MED ORDER — LEVOTHYROXINE SODIUM 75 MCG PO TABS: 75.0000 ug | ORAL_TABLET | Freq: Every day | ORAL | 0 refills | Status: DC

## 2022-09-08 NOTE — Telephone Encounter (Signed)
Requested medication (s) are due for refill today: yes  Requested medication (s) are on the active medication list: yes  Last refill:  06/07/22 #90  Future visit scheduled: yes  Notes to clinic:  overdue lab work   Requested Prescriptions  Pending Prescriptions Disp Refills   levothyroxine (SYNTHROID) 75 MCG tablet 90 tablet 0    Sig: Take 1 tablet (75 mcg total) by mouth daily.     Endocrinology:  Hypothyroid Agents Failed - 09/07/2022  3:48 PM      Failed - TSH in normal range and within 360 days    TSH  Date Value Ref Range Status  12/07/2020 1.180 0.450 - 4.500 uIU/mL Final         Passed - Valid encounter within last 12 months    Recent Outpatient Visits           3 months ago Chronic bilateral low back pain with bilateral sciatica   [place] [facility] at [facility] Amy J, NP   4 months ago Type 2 diabetes mellitus without complication, without long-term current use of insulin ([place])   [place] [facility] at [facility], MD   11 months ago Carpal tunnel syndrome, bilateral   [place] [facility] at [facility], Cari S, PA-C   11 months ago Type 2 diabetes mellitus without complication, without long-term current use of insulin ([place])   [place] [facility] Care at [facility], Clyde Canterbury, MD   1 year ago Type 2 diabetes mellitus without complication, without long-term current use of insulin [facility])   [place] Primary Care at [facility], MD       Future Appointments             In 1 month Dorna Mai, MD [facility] at [facility]

## 2022-09-13 ENCOUNTER — Other Ambulatory Visit: Payer: Self-pay | Admitting: Family Medicine

## 2022-09-13 DIAGNOSIS — I10 Essential (primary) hypertension: Secondary | ICD-10-CM

## 2022-09-13 DIAGNOSIS — E119 Type 2 diabetes mellitus without complications: Secondary | ICD-10-CM

## 2022-10-26 ENCOUNTER — Encounter: Payer: Self-pay | Admitting: Family Medicine

## 2022-10-26 ENCOUNTER — Ambulatory Visit (INDEPENDENT_AMBULATORY_CARE_PROVIDER_SITE_OTHER): Payer: Medicare HMO | Admitting: Family Medicine

## 2022-10-26 VITALS — BP 132/83 | HR 98 | Temp 97.7°F | Resp 16 | Wt 186.0 lb

## 2022-10-26 DIAGNOSIS — E1169 Type 2 diabetes mellitus with other specified complication: Secondary | ICD-10-CM | POA: Diagnosis not present

## 2022-10-26 DIAGNOSIS — E785 Hyperlipidemia, unspecified: Secondary | ICD-10-CM | POA: Diagnosis not present

## 2022-10-26 DIAGNOSIS — M2550 Pain in unspecified joint: Secondary | ICD-10-CM | POA: Diagnosis not present

## 2022-10-26 DIAGNOSIS — I10 Essential (primary) hypertension: Secondary | ICD-10-CM

## 2022-10-26 DIAGNOSIS — E039 Hypothyroidism, unspecified: Secondary | ICD-10-CM

## 2022-10-26 DIAGNOSIS — E119 Type 2 diabetes mellitus without complications: Secondary | ICD-10-CM | POA: Diagnosis not present

## 2022-10-26 NOTE — Progress Notes (Signed)
Patient is here for their 6 month follow-up Patient has no concerns today Care gaps have been discussed with patient  

## 2022-10-26 NOTE — Progress Notes (Signed)
Established Patient Office Visit  Subjective    Patient ID: Laurie Michael, female    DOB: 1950-09-28  Age: 72 y.o. MRN: [id number]  CC:  Chief Complaint  Patient presents with   Follow-up    6 month    HPI [name] presents for routine follow up of chronic med issues. Patient denies acute complaints or concerns.    Outpatient Encounter Medications as of 10/26/2022  Medication Sig   Biotin (BIOTIN MAXIMUM STRENGTH) 10 MG TABS Take by mouth.   Calcium Magnesium Zinc 333-133-5 MG TABS Take 1 capsule by mouth daily.   Cholecalciferol 50 MCG (2000 UT) CAPS Take by mouth.   Cyanocobalamin 2500 MCG TABS Take 1 tablet by mouth daily.   cyclobenzaprine (FLEXERIL) 10 MG tablet Take by mouth.   erythromycin base (E-MYCIN) 500 MG tablet    glipiZIDE (GLUCOTROL XL) 10 MG 24 hr tablet Take by mouth.   neomycin (MYCIFRADIN) 500 MG tablet    Propylene Glycol (SYSTANE BALANCE) 0.6 % SOLN Apply to eye.   Soy Isoflavone 40 MG TABS Take by mouth.   Ascorbic Acid (VITAMIN C) 1000 MG tablet Take 1,000 mg by mouth daily.   atorvastatin (LIPITOR) 80 MG tablet TAKE 1 TABLET EVERY DAY AT 6 PM   Blood Glucose Monitoring Suppl (TRUE METRIX METER) w/Device KIT USE AS DIRECTED   calcium-vitamin D (OSCAL WITH D) 500-200 MG-UNIT tablet Take 2 tablets by mouth daily.   estradiol (ESTRACE VAGINAL) 0.1 MG/GM vaginal cream Insert 0.5 g every other day by vaginal route.   gabapentin (NEURONTIN) 300 MG capsule Take 1 capsule (300 mg total) by mouth 3 (three) times daily.   glipiZIDE (GLUCOTROL XL) 5 MG 24 hr tablet Take 1 tablet (5 mg total) by mouth daily.   glucose blood (TRUE METRIX BLOOD GLUCOSE TEST) test strip Use to check blood sugar once daily. Diagnosis code E11.9   hydrochlorothiazide (HYDRODIURIL) 25 MG tablet    levothyroxine (SYNTHROID) 50 MCG tablet    levothyroxine (SYNTHROID) 75 MCG tablet Take 1 tablet (75 mcg total) by mouth daily.   lisinopril (ZESTRIL) 10 MG tablet Take 1 tablet (10  mg total) by mouth daily.   magnesium 30 MG tablet Take 30 mg by mouth 2 (two) times daily.   metFORMIN (GLUCOPHAGE) 1000 MG tablet Take 1 tablet (1,000 mg total) by mouth 2 (two) times daily with a meal.   traMADol (ULTRAM) 50 MG tablet Take 1 tablet (50 mg total) by mouth every 6 (six) hours as needed. TAKE 1 TABLET BY MOUTH EVERY 4 HOURS AS NEEDED FOR UP TO 5 DAYS   TRUEplus Lancets 30G MISC Use to check blood sugar once daily. Diagnosis code E11.9   [DISCONTINUED] Blood Glucose Monitoring Suppl (TRUE METRIX METER) w/Device KIT USE AS DIRECTED   [DISCONTINUED] glucose blood (TRUE METRIX BLOOD GLUCOSE TEST) test strip Use to check blood sugar once daily. Diagnosis code E11.9   [DISCONTINUED] levothyroxine (SYNTHROID) 75 MCG tablet Take 1 tablet (75 mcg total) by mouth daily.   [DISCONTINUED] lisinopril (ZESTRIL) 10 MG tablet TAKE 1 TABLET EVERY DAY   [DISCONTINUED] metFORMIN (GLUCOPHAGE) 1000 MG tablet Take 1 tablet (1,000 mg total) by mouth 2 (two) times daily with a meal.   [DISCONTINUED] traMADol (ULTRAM) 50 MG tablet TAKE 1 TABLET BY MOUTH EVERY 4 HOURS AS NEEDED FOR UP TO 5 DAYS   [DISCONTINUED] TRUEplus Lancets 30G MISC Use to check blood sugar once daily. Diagnosis code E11.9   No facility-administered encounter medications  on file as of 10/26/2022.    Past Medical History:  Diagnosis Date   Complication of anesthesia    Diabetes mellitus without complication (HCC)    Diverticulosis 08/2018   partial colectomy   Family history of adverse reaction to anesthesia    mom is slow to wake up   Hypertension    Hypothyroidism    Irritable bowel    Obesity    PONV (postoperative nausea and vomiting)     Past Surgical History:  Procedure Laterality Date   CATARACT EXTRACTION, BILATERAL  2017   COLON SURGERY  08/2018   partial colectomy for diverticulosis   COLONOSCOPY N/A 07/12/2016   Procedure: COLONOSCOPY;  Surgeon: Graylin Shiver, MD;  Location: [facility] ENDOSCOPY;  Service: Endoscopy;   Laterality: N/A;   HYSTEROSCOPY W/ ENDOMETRIAL ABLATION     SHOULDER ARTHROSCOPY WITH DISTAL CLAVICLE RESECTION Right 01/31/2019   Procedure: SHOULDER ARTHROSCOPY WITH DISTAL CLAVICLE RESECTION;  Surgeon: Frederico Hamman, MD;  Location: [facility];  Service: Orthopedics;  Laterality: Right;   SHOULDER ARTHROSCOPY WITH SUBACROMIAL DECOMPRESSION Right 01/31/2019   Procedure: SHOULDER ARTHROSCOPY WITH SUBACROMIAL DECOMPRESSION;  Surgeon: Frederico Hamman, MD;  Location: [facility];  Service: Orthopedics;  Laterality: Right;   TUBAL LIGATION     WISDOM TOOTH EXTRACTION      Family History  Problem Relation Age of Onset   Diabetes Mother    Breast cancer Mother    Heart disease Father    Cancer Brother    Breast cancer Sister     Social History   Socioeconomic History   Marital status: Married    Spouse name: Not on file   Number of children: Not on file   Years of education: Not on file   Highest education level: Not on file  Occupational History   Not on file  Tobacco Use   Smoking status: Former    Packs/day: 2.00    Years: 27.00    Additional pack years: 0.00    Total pack years: 54.00    Types: Cigarettes    Quit date: 01/10/2013    Years since quitting: 9.8    Passive exposure: Past   Smokeless tobacco: Never  Vaping Use   Vaping Use: Never used  Substance and Sexual Activity   Alcohol use: No    Alcohol/week: 0.0 standard drinks of alcohol   Drug use: No   Sexual activity: Yes    Birth control/protection: None, Post-menopausal  Other Topics Concern   Not on file  Social History Narrative   Not on file   Social Determinants of Health   Financial Resource Strain: Not on file  Food Insecurity: Not on file  Transportation Needs: Not on file  Physical Activity: Not on file  Stress: Not on file  Social Connections: Not on file  Intimate Partner Violence: Not on file    Review of Systems  All other systems reviewed and are  negative.       Objective    BP 132/83   Pulse 98   Temp 97.7 F (36.5 C) (Oral)   Resp 16   Wt 186 lb (84.4 kg)   SpO2 92%   BMI 34.02 kg/m   Physical Exam Vitals and nursing note reviewed.  Constitutional:      General: She is not in acute distress. Cardiovascular:     Rate and Rhythm: Normal rate and regular rhythm.  Pulmonary:     Effort: Pulmonary effort is normal.  Breath sounds: Normal breath sounds.  Abdominal:     Palpations: Abdomen is soft.     Tenderness: There is no abdominal tenderness.  Musculoskeletal:     Cervical back: Normal range of motion and neck supple.     Lumbar back: Tenderness present. Decreased range of motion.     Right lower leg: No edema.     Left lower leg: No edema.  Neurological:     General: No focal deficit present.     Mental Status: She is alert and oriented to person, place, and time.         Assessment & Plan:   1. Type 2 diabetes mellitus without complication, without long-term current use of insulin (HCC)  - POCT glycosylated hemoglobin (Hb A1C) - Microalbumin / creatinine urine ratio - HM DIABETES FOOT EXAM - Basic Metabolic Panel - lisinopril (ZESTRIL) 10 MG tablet; Take 1 tablet (10 mg total) by mouth daily.  Dispense: 90 tablet; Refill: 0 - metFORMIN (GLUCOPHAGE) 1000 MG tablet; Take 1 tablet (1,000 mg total) by mouth 2 (two) times daily with a meal.  Dispense: 180 tablet; Refill: 0  2. Essential hypertension Appears stable. Monitoring labs ordered. Meds refilled.  - Basic Metabolic Panel - lisinopril (ZESTRIL) 10 MG tablet; Take 1 tablet (10 mg total) by mouth daily.  Dispense: 90 tablet; Refill: 0  3. Hyperlipidemia associated with type 2 diabetes mellitus (HCC) Continue  - Lipid Panel  4. Hypothyroidism, unspecified type Appears stable. Monitoring labs ordered. Meds refilled.  - TSH - T4, Free - levothyroxine (SYNTHROID) 75 MCG tablet; Take 1 tablet (75 mcg total) by mouth daily.  Dispense: 90  tablet; Refill: 0  5. Polyarthralgia Tramadol prescribed for prn use     Return in about 6 months (around 04/28/2023) for follow up.   Tommie Raymond, MD

## 2022-10-27 LAB — MICROALBUMIN / CREATININE URINE RATIO
Creatinine, Urine: 147.7 mg/dL
Microalb/Creat Ratio: 33 mg/g creat — ABNORMAL HIGH (ref 0–29)
Microalbumin, Urine: 49.2 ug/mL

## 2022-10-29 LAB — LIPID PANEL
Chol/HDL Ratio: 2.5 ratio (ref 0.0–4.4)
Cholesterol, Total: 142 mg/dL (ref 100–199)
HDL: 56 mg/dL (ref >39)
LDL Chol Calc (NIH): 65 mg/dL (ref 0–99)
Triglycerides: 118 mg/dL (ref 0–149)
VLDL Cholesterol Cal: 21 mg/dL (ref 5–40)

## 2022-10-29 LAB — BASIC METABOLIC PANEL
BUN/Creatinine Ratio: 25 (ref 12–28)
BUN: 16 mg/dL (ref 8–27)
CO2: 24 mmol/L (ref 20–29)
Calcium: 9.9 mg/dL (ref 8.7–10.3)
Chloride: 104 mmol/L (ref 96–106)
Creatinine, Ser: 0.65 mg/dL (ref 0.57–1.00)
Glucose: 125 mg/dL — ABNORMAL HIGH (ref 70–99)
Potassium: 4.9 mmol/L (ref 3.5–5.2)
Sodium: 144 mmol/L (ref 134–144)
eGFR: 95 mL/min/{1.73_m2} (ref >59)

## 2022-10-29 LAB — SPECIMEN STATUS REPORT

## 2022-10-29 LAB — TSH: TSH: 0.731 u[IU]/mL (ref 0.450–4.500)

## 2022-10-29 LAB — T4, FREE: Free T4: 1.37 ng/dL (ref 0.82–1.77)

## 2022-10-31 ENCOUNTER — Encounter: Payer: Self-pay | Admitting: Family Medicine

## 2022-10-31 MED ORDER — TRAMADOL HCL 50 MG PO TABS: 50.0000 mg | ORAL_TABLET | Freq: Four times a day (QID) | ORAL | 0 refills | Status: AC | PRN

## 2022-10-31 MED ORDER — LEVOTHYROXINE SODIUM 75 MCG PO TABS: 75.0000 ug | ORAL_TABLET | Freq: Every day | ORAL | 0 refills | Status: DC

## 2022-10-31 MED ORDER — METFORMIN HCL 1000 MG PO TABS: 1000.0000 mg | ORAL_TABLET | Freq: Two times a day (BID) | ORAL | 0 refills | Status: DC

## 2022-10-31 MED ORDER — TRUEPLUS LANCETS 30G MISC: 2 refills | Status: AC

## 2022-10-31 MED ORDER — TRUE METRIX BLOOD GLUCOSE TEST VI STRP: ORAL_STRIP | 2 refills | Status: AC

## 2022-10-31 MED ORDER — LISINOPRIL 10 MG PO TABS: 10.0000 mg | ORAL_TABLET | Freq: Every day | ORAL | 0 refills | Status: DC

## 2022-10-31 MED ORDER — TRUE METRIX METER W/DEVICE KIT: PACK | 0 refills | Status: AC

## 2022-11-27 ENCOUNTER — Other Ambulatory Visit: Payer: Self-pay | Admitting: Family Medicine

## 2022-11-27 DIAGNOSIS — E119 Type 2 diabetes mellitus without complications: Secondary | ICD-10-CM

## 2022-11-27 NOTE — Telephone Encounter (Signed)
Medication Refill - Medication: glipiZIDE (GLUCOTROL XL) 5 MG 24 hr tablet   Has the patient contacted their pharmacy? No.   Preferred Pharmacy (with phone number or street name):  [facility] [address] [place]), [place] [id number] Phone: [phone]  Fax: [phone]     Has the patient been seen for an appointment in the last year OR does the patient have an upcoming appointment? Yes.    Please assist patient further

## 2022-11-28 MED ORDER — GLIPIZIDE ER 5 MG PO TB24: 5.0000 mg | ORAL_TABLET | Freq: Every day | ORAL | 1 refills | Status: DC

## 2022-11-28 NOTE — Telephone Encounter (Signed)
Requested Prescriptions  Pending Prescriptions Disp Refills   glipiZIDE (GLUCOTROL XL) 5 MG 24 hr tablet 90 tablet 1    Sig: Take 1 tablet (5 mg total) by mouth daily.     Endocrinology:  Diabetes - Sulfonylureas Failed - 11/27/2022  3:49 PM      Failed - HBA1C is between 0 and 7.9 and within 180 days    HbA1c, POC (controlled diabetic range)  Date Value Ref Range Status  05/24/2020 6.0 0.0 - 7.0 % Final   Hgb A1c MFr Bld  Date Value Ref Range Status  04/27/2022 6.3 (H) 4.8 - 5.6 % Final    Comment:             Prediabetes: 5.7 - 6.4          Diabetes: >6.4          Glycemic control for adults with diabetes: <7.0          Passed - Cr in normal range and within 360 days    Creatinine, Ser  Date Value Ref Range Status  10/26/2022 0.65 0.57 - 1.00 mg/dL Final         Passed - Valid encounter within last 6 months    Recent Outpatient Visits           1 month ago Type 2 diabetes mellitus without complication, without long-term current use of insulin (HCC)   [place] [facility] at [facility], Lauris Poag, MD   5 months ago Chronic bilateral low back pain with bilateral sciatica   [place] [facility] at [facility], Amy J, NP   7 months ago Type 2 diabetes mellitus without complication, without long-term current use of insulin (HCC)   [place] [facility] at [facility], MD   1 year ago Carpal tunnel syndrome, bilateral   [place] [facility] at [facility], [place], PA-C   1 year ago Type 2 diabetes mellitus without complication, without long-term current use of insulin [facility])   [place] [facility] Care at [facility], MD

## 2023-01-04 ENCOUNTER — Ambulatory Visit (INDEPENDENT_AMBULATORY_CARE_PROVIDER_SITE_OTHER): Payer: Medicare HMO

## 2023-01-04 VITALS — Ht 62.0 in | Wt 183.0 lb

## 2023-01-04 DIAGNOSIS — Z0000 Encounter for general adult medical examination without abnormal findings: Secondary | ICD-10-CM

## 2023-01-04 NOTE — Patient Instructions (Signed)
Laurie Michael , Thank you for taking time to come for your Medicare Wellness Visit. I appreciate your ongoing commitment to your health goals. Please review the following plan we discussed and let me know if I can assist you in the future.   These are the goals we discussed:  Goals      Patient Stated     01/04/2023, no goals        This is a list of the screening recommended for you and due dates:  Health Maintenance  Topic Date Due   Screening for Lung Cancer  Never done   Hemoglobin A1C  07/27/2022   Eye exam for diabetics  02/11/2023*   Mammogram  02/18/2023   Yearly kidney function blood test for diabetes  10/26/2023   Yearly kidney health urinalysis for diabetes  10/26/2023   Complete foot exam   10/26/2023   Medicare Annual Wellness Visit  01/04/2024   Colon Cancer Screening  07/12/2026   DTaP/Tdap/Td vaccine (2 - Td or Tdap) 01/12/2029   DEXA scan (bone density measurement)  Completed   Hepatitis C Screening  Completed   HPV Vaccine  Aged Out   Pneumonia Vaccine  Discontinued   Flu Shot  Discontinued   COVID-19 Vaccine  Discontinued   Zoster (Shingles) Vaccine  Discontinued  *Topic was postponed. The date shown is not the original due date.    Advanced directives: Advance directive discussed with you today.   Conditions/risks identified: none  Next appointment: Follow up in one year for your annual wellness visit    Preventive Care 65 Years and Older, Female Preventive care refers to lifestyle choices and visits with your health care provider that can promote health and wellness. What does preventive care include? A yearly physical exam. This is also called an annual well check. Dental exams once or twice a year. Routine eye exams. Ask your health care provider how often you should have your eyes checked. Personal lifestyle choices, including: Daily care of your teeth and gums. Regular physical activity. Eating a healthy diet. Avoiding tobacco and drug  use. Limiting alcohol use. Practicing safe sex. Taking low-dose aspirin every day. Taking vitamin and mineral supplements as recommended by your health care provider. What happens during an annual well check? The services and screenings done by your health care provider during your annual well check will depend on your age, overall health, lifestyle risk factors, and family history of disease. Counseling  Your health care provider may ask you questions about your: Alcohol use. Tobacco use. Drug use. Emotional well-being. Home and relationship well-being. Sexual activity. Eating habits. History of falls. Memory and ability to understand (cognition). Work and work Astronomer. Reproductive health. Screening  You may have the following tests or measurements: Height, weight, and BMI. Blood pressure. Lipid and cholesterol levels. These may be checked every 5 years, or more frequently if you are over 57 years old. Skin check. Lung cancer screening. You may have this screening every year starting at age 64 if you have a 30-pack-year history of smoking and currently smoke or have quit within the past 15 years. Fecal occult blood test (FOBT) of the stool. You may have this test every year starting at age 39. Flexible sigmoidoscopy or colonoscopy. You may have a sigmoidoscopy every 5 years or a colonoscopy every 10 years starting at age 34. Hepatitis C blood test. Hepatitis B blood test. Sexually transmitted disease (STD) testing. Diabetes screening. This is done by checking your blood sugar (glucose) after  you have not eaten for a while (fasting). You may have this done every 1-3 years. Bone density scan. This is done to screen for osteoporosis. You may have this done starting at age 60. Mammogram. This may be done every 1-2 years. Talk to your health care provider about how often you should have regular mammograms. Talk with your health care provider about your test results, treatment  options, and if necessary, the need for more tests. Vaccines  Your health care provider may recommend certain vaccines, such as: Influenza vaccine. This is recommended every year. Tetanus, diphtheria, and acellular pertussis (Tdap, Td) vaccine. You may need a Td booster every 10 years. Zoster vaccine. You may need this after age 39. Pneumococcal 13-valent conjugate (PCV13) vaccine. One dose is recommended after age 50. Pneumococcal polysaccharide (PPSV23) vaccine. One dose is recommended after age 64. Talk to your health care provider about which screenings and vaccines you need and how often you need them. This information is not intended to replace advice given to you by your health care provider. Make sure you discuss any questions you have with your health care provider. Document Released: 08/27/2015 Document Revised: 04/19/2016 Document Reviewed: 06/01/2015 Elsevier Interactive Patient Education  2017 [employer].  Fall Prevention in the Home Falls can cause injuries. They can happen to people of all ages. There are many things you can do to make your home safe and to help prevent falls. What can I do on the outside of my home? Regularly fix the edges of walkways and driveways and fix any cracks. Remove anything that might make you trip as you walk through a door, such as a raised step or threshold. Trim any bushes or trees on the path to your home. Use bright outdoor lighting. Clear any walking paths of anything that might make someone trip, such as rocks or tools. Regularly check to see if handrails are loose or broken. Make sure that both sides of any steps have handrails. Any raised decks and porches should have guardrails on the edges. Have any leaves, snow, or ice cleared regularly. Use sand or salt on walking paths during winter. Clean up any spills in your garage right away. This includes oil or grease spills. What can I do in the bathroom? Use night lights. Install grab  bars by the toilet and in the tub and shower. Do not use towel bars as grab bars. Use non-skid mats or decals in the tub or shower. If you need to sit down in the shower, use a plastic, non-slip stool. Keep the floor dry. Clean up any water that spills on the floor as soon as it happens. Remove soap buildup in the tub or shower regularly. Attach bath mats securely with double-sided non-slip rug tape. Do not have throw rugs and other things on the floor that can make you trip. What can I do in the bedroom? Use night lights. Make sure that you have a light by your bed that is easy to reach. Do not use any sheets or blankets that are too big for your bed. They should not hang down onto the floor. Have a firm chair that has side arms. You can use this for support while you get dressed. Do not have throw rugs and other things on the floor that can make you trip. What can I do in the kitchen? Clean up any spills right away. Avoid walking on wet floors. Keep items that you use a lot in easy-to-reach places. If you  need to reach something above you, use a strong step stool that has a grab bar. Keep electrical cords out of the way. Do not use floor polish or wax that makes floors slippery. If you must use wax, use non-skid floor wax. Do not have throw rugs and other things on the floor that can make you trip. What can I do with my stairs? Do not leave any items on the stairs. Make sure that there are handrails on both sides of the stairs and use them. Fix handrails that are broken or loose. Make sure that handrails are as long as the stairways. Check any carpeting to make sure that it is firmly attached to the stairs. Fix any carpet that is loose or worn. Avoid having throw rugs at the top or bottom of the stairs. If you do have throw rugs, attach them to the floor with carpet tape. Make sure that you have a light switch at the top of the stairs and the bottom of the stairs. If you do not have them,  ask someone to add them for you. What else can I do to help prevent falls? Wear shoes that: Do not have high heels. Have rubber bottoms. Are comfortable and fit you well. Are closed at the toe. Do not wear sandals. If you use a stepladder: Make sure that it is fully opened. Do not climb a closed stepladder. Make sure that both sides of the stepladder are locked into place. Ask someone to hold it for you, if possible. Clearly mark and make sure that you can see: Any grab bars or handrails. First and last steps. Where the edge of each step is. Use tools that help you move around (mobility aids) if they are needed. These include: Canes. Walkers. Scooters. Crutches. Turn on the lights when you go into a dark area. Replace any light bulbs as soon as they burn out. Set up your furniture so you have a clear path. Avoid moving your furniture around. If any of your floors are uneven, fix them. If there are any pets around you, be aware of where they are. Review your medicines with your doctor. Some medicines can make you feel dizzy. This can increase your chance of falling. Ask your doctor what other things that you can do to help prevent falls. This information is not intended to replace advice given to you by your health care provider. Make sure you discuss any questions you have with your health care provider. Document Released: 05/27/2009 Document Revised: 01/06/2016 Document Reviewed: 09/04/2014 Elsevier Interactive Patient Education  2017 [employer].

## 2023-01-04 NOTE — Progress Notes (Signed)
I connected with  Lovell Sheehan on 01/04/23 by a audio enabled telemedicine application and verified that I am speaking with the correct person using two identifiers.  Patient Location: Home  Provider Location: Office/Clinic  I discussed the limitations of evaluation and management by telemedicine. The patient expressed understanding and agreed to proceed.  Subjective:   Laurie Michael is a 72 y.o. female who presents for an Initial Medicare Annual Wellness Visit.  Review of Systems     Cardiac Risk Factors include: advanced age (>46men, >92 women);diabetes mellitus;dyslipidemia;hypertension;obesity (BMI >30kg/m2)     Objective:    Today's Vitals   01/04/23 1559  Weight: 183 lb (83 kg)  Height: 5\' 2"  (1.575 m)   Body mass index is 33.47 kg/m.     01/04/2023    4:04 PM 01/29/2019    9:43 AM 11/24/2016    1:19 PM 07/26/2016    1:34 PM 07/12/2016    8:07 AM 05/02/2016    4:15 PM 02/29/2016    3:13 PM  Advanced Directives  Does Patient Have a Medical Advance Directive? No No No No No No No  Would patient like information on creating a medical advance directive?  No - Patient declined Yes (MAU/Ambulatory/Procedural Areas - Information given)  No - Patient declined No - Patient declined Yes - Educational materials given No - patient declined information     Significant value    Current Medications (verified) Outpatient Encounter Medications as of 01/04/2023  Medication Sig   Ascorbic Acid (VITAMIN C) 1000 MG tablet Take 1,000 mg by mouth daily.   atorvastatin (LIPITOR) 80 MG tablet TAKE 1 TABLET EVERY DAY AT 6 PM   Biotin (BIOTIN MAXIMUM STRENGTH) 10 MG TABS Take by mouth.   Blood Glucose Monitoring Suppl (TRUE METRIX METER) w/Device KIT USE AS DIRECTED   Calcium Magnesium Zinc 333-133-5 MG TABS Take 1 capsule by mouth daily.   calcium-vitamin D (OSCAL WITH D) 500-200 MG-UNIT tablet Take 2 tablets by mouth daily.   Cholecalciferol 50 MCG (2000 UT) CAPS Take by mouth.    Cyanocobalamin 2500 MCG TABS Take 1 tablet by mouth daily.   glipiZIDE (GLUCOTROL XL) 5 MG 24 hr tablet Take 1 tablet (5 mg total) by mouth daily.   glucose blood (TRUE METRIX BLOOD GLUCOSE TEST) test strip Use to check blood sugar once daily. Diagnosis code E11.9   hydrochlorothiazide (HYDRODIURIL) 25 MG tablet    levothyroxine (SYNTHROID) 75 MCG tablet Take 1 tablet (75 mcg total) by mouth daily.   lisinopril (ZESTRIL) 10 MG tablet Take 1 tablet (10 mg total) by mouth daily.   magnesium 30 MG tablet Take 30 mg by mouth 2 (two) times daily.   metFORMIN (GLUCOPHAGE) 1000 MG tablet Take 1 tablet (1,000 mg total) by mouth 2 (two) times daily with a meal.   TRUEplus Lancets 30G MISC Use to check blood sugar once daily. Diagnosis code E11.9   cyclobenzaprine (FLEXERIL) 10 MG tablet Take by mouth. (Patient not taking: Reported on 01/04/2023)   erythromycin base (E-MYCIN) 500 MG tablet  (Patient not taking: Reported on 01/04/2023)   estradiol (ESTRACE VAGINAL) 0.1 MG/GM vaginal cream Insert 0.5 g every other day by vaginal route. (Patient not taking: Reported on 01/04/2023)   gabapentin (NEURONTIN) 300 MG capsule Take 1 capsule (300 mg total) by mouth 3 (three) times daily. (Patient not taking: Reported on 01/04/2023)   glipiZIDE (GLUCOTROL XL) 10 MG 24 hr tablet Take by mouth. (Patient not taking: Reported on 01/04/2023)  levothyroxine (SYNTHROID) 50 MCG tablet  (Patient not taking: Reported on 01/04/2023)   neomycin (MYCIFRADIN) 500 MG tablet  (Patient not taking: Reported on 01/04/2023)   Propylene Glycol (SYSTANE BALANCE) 0.6 % SOLN Apply to eye. (Patient not taking: Reported on 01/04/2023)   Soy Isoflavone 40 MG TABS Take by mouth. (Patient not taking: Reported on 01/04/2023)   traMADol (ULTRAM) 50 MG tablet Take 1 tablet (50 mg total) by mouth every 6 (six) hours as needed. TAKE 1 TABLET BY MOUTH EVERY 4 HOURS AS NEEDED FOR UP TO 5 DAYS (Patient not taking: Reported on 01/04/2023)   No  facility-administered encounter medications on file as of 01/04/2023.    Allergies (verified) Patient has no known allergies.   History: Past Medical History:  Diagnosis Date   Complication of anesthesia    Diabetes mellitus without complication (HCC)    Diverticulosis 08/2018   partial colectomy   Family history of adverse reaction to anesthesia    mom is slow to wake up   Hypertension    Hypothyroidism    Irritable bowel    Obesity    PONV (postoperative nausea and vomiting)    Past Surgical History:  Procedure Laterality Date   CATARACT EXTRACTION, BILATERAL  2017   COLON SURGERY  08/2018   partial colectomy for diverticulosis   COLONOSCOPY N/A 07/12/2016   Procedure: COLONOSCOPY;  Surgeon: Graylin Shiver, MD;  Location[facility] ENDOSCOPY;  Service: Endoscopy;  Laterality: N/A;   HYSTEROSCOPY W/ ENDOMETRIAL ABLATION     SHOULDER ARTHROSCOPY WITH DISTAL CLAVICLE RESECTION Right 01/31/2019   Procedure: SHOULDER ARTHROSCOPY WITH DISTAL CLAVICLE RESECTION;  Surgeon: Frederico Hamman, MD;  Location: [facility];  Service: Orthopedics;  Laterality: Right;   SHOULDER ARTHROSCOPY WITH SUBACROMIAL DECOMPRESSION Right 01/31/2019   Procedure: SHOULDER ARTHROSCOPY WITH SUBACROMIAL DECOMPRESSION;  Surgeon: Frederico Hamman, MD;  Location: [facility];  Service: Orthopedics;  Laterality: Right;   TUBAL LIGATION     WISDOM TOOTH EXTRACTION     Family History  Problem Relation Age of Onset   Diabetes Mother    Breast cancer Mother    Heart disease Father    Cancer Brother    Breast cancer Sister    Social History   Socioeconomic History   Marital status: Widowed    Spouse name: Not on file   Number of children: Not on file   Years of education: Not on file   Highest education level: Not on file  Occupational History   Not on file  Tobacco Use   Smoking status: Former    Packs/day: 2.00    Years: 27.00    Additional pack years: 0.00    Total pack years:  54.00    Types: Cigarettes    Quit date: 01/10/2013    Years since quitting: 9.9    Passive exposure: Past   Smokeless tobacco: Never  Vaping Use   Vaping Use: Never used  Substance and Sexual Activity   Alcohol use: No    Alcohol/week: 0.0 standard drinks of alcohol   Drug use: No   Sexual activity: Yes    Birth control/protection: None, Post-menopausal  Other Topics Concern   Not on file  Social History Narrative   Not on file   Social Determinants of Health   Financial Resource Strain: Not on file  Food Insecurity: No Food Insecurity (01/04/2023)   Hunger Vital Sign    Worried About Running Out of Food in the Last Year: Never true  Ran Out of Food in the Last Year: Never true  Transportation Needs: No Transportation Needs (01/04/2023)   PRAPARE - Administrator, Civil Service (Medical): No    Lack of Transportation (Non-Medical): No  Physical Activity: Inactive (01/04/2023)   Exercise Vital Sign    Days of Exercise per Week: 0 days    Minutes of Exercise per Session: 0 min  Stress: Stress Concern Present (01/04/2023)   [employer] of Occupational Health - Occupational Stress Questionnaire    Feeling of Stress : To some extent  Social Connections: Not on file    Tobacco Counseling Counseling given: Not Answered   Clinical Intake:  Pre-visit preparation completed: Yes  Pain : No/denies pain     Nutritional Status: BMI > 30  Obese Nutritional Risks: None Diabetes: Yes  How often do you need to have someone help you when you read instructions, pamphlets, or other written materials from your doctor or pharmacy?: 1 - Never  Diabetic? Yes Nutrition Risk Assessment:  Has the patient had any N/V/D within the last 2 months?  No  Does the patient have any non-healing wounds?  No  Has the patient had any unintentional weight loss or weight gain?  No   Diabetes:  Is the patient diabetic?  Yes  If diabetic, was a CBG obtained today?  No  Did the  patient bring in their glucometer from home?  No  How often do you monitor your CBG's? daily.   Financial Strains and Diabetes Management:  Are you having any financial strains with the device, your supplies or your medication? No .  Does the patient want to be seen by Chronic Care Management for management of their diabetes?  No  Would the patient like to be referred to a Nutritionist or for Diabetic Management?  No   Diabetic Exams:  Diabetic Eye Exam: Overdue for diabetic eye exam. Pt has been advised about the importance in completing this exam. Patient advised to call and schedule an eye exam. Diabetic Foot Exam: Completed 10/26/2022  Interpreter Needed?: No  Information entered by :: NAllen LPN   Activities of Daily Living    01/04/2023    4:06 PM  In your present state of health, do you have any difficulty performing the following activities:  Hearing? 0  Vision? 1  Comment has dry eyes  Difficulty concentrating or making decisions? 0  Walking or climbing stairs? 1  Dressing or bathing? 0  Doing errands, shopping? 0  Preparing Food and eating ? N  Using the Toilet? N  In the past six months, have you accidently leaked urine? Y  Do you have problems with loss of bowel control? Y  Managing your Medications? N  Managing your Finances? N  Housekeeping or managing your Housekeeping? N    Patient Care Team: Georganna Skeans, MD as PCP - General (Family Medicine) Conley Rolls, [facility] [place], [place] as Referring Physician (Optometry) Sallye Lat, MD as Consulting Physician (Ophthalmology)  Indicate any recent Medical Services you may have received from other than [facility] providers in the past year (date may be approximate).     Assessment:   This is a routine wellness examination for [place].  Hearing/Vision screen Vision Screening - Comments:: Regular eye exams, [facility]  Dietary issues and exercise activities discussed: Current Exercise Habits: The patient does not  participate in regular exercise at present   Goals Addressed             This Visit's  Progress    Patient Stated       01/04/2023, no goals       Depression Screen    01/04/2023    4:05 PM 06/02/2022    2:47 PM 09/28/2021    2:26 PM 03/28/2021    2:13 PM 12/07/2020    3:32 PM 08/25/2020   11:15 AM 10/14/2019    2:14 PM  PHQ 2/9 Scores  PHQ - 2 Score 0 0 0 0 0 0 1  PHQ- 9 Score   0   6     Fall Risk    01/04/2023    4:05 PM 04/27/2022    3:51 PM 12/07/2020    3:32 PM 08/25/2020   11:15 AM 10/14/2019    2:13 PM  Fall Risk   Falls in the past year? 1 0 0 0 0  Comment trips      Number falls in past yr: 0  0 0 0  Injury with Fall? 0 0 0 0 0  Risk for fall due to : Medication side effect No Fall Risks No Fall Risks    Follow up Falls prevention discussed;Education provided;Falls evaluation completed        FALL RISK PREVENTION PERTAINING TO THE HOME:  Any stairs in or around the home? Yes  If so, are there any without handrails? No  Home free of loose throw rugs in walkways, pet beds, electrical cords, etc? Yes  Adequate lighting in your home to reduce risk of falls? Yes   ASSISTIVE DEVICES UTILIZED TO PREVENT FALLS:  Life alert? No  Use of a cane, walker or w/c? No  Grab bars in the bathroom? No  Shower chair or bench in shower? No  Elevated toilet seat or a handicapped toilet? Yes   TIMED UP AND GO:  Was the test performed? No .      Cognitive Function:        01/04/2023    4:08 PM  6CIT Screen  What Year? 0 points  What month? 0 points  What time? 0 points  Count back from 20 0 points  Months in reverse 2 points  Repeat phrase 0 points  Total Score 2 points    Immunizations Immunization History  Administered Date(s) Administered   Influenza,inj,Quad PF,6+ Mos 05/02/2016, 07/17/2017, 05/08/2018, 04/16/2019, 05/24/2020   Janssen (J&J) SARS-COV-2 Vaccination 10/27/2019   PFIZER(Purple Top)SARS-COV-2 Vaccination 05/28/2020   Pneumococcal  Conjugate-13 07/17/2017   Pneumococcal Polysaccharide-23 05/02/2016   Tdap 01/13/2019    TDAP status: Up to date  Flu Vaccine status: Up to date  Pneumococcal vaccine status: Up to date  Covid-19 vaccine status: Completed vaccines  Qualifies for Shingles Vaccine? Yes   Zostavax completed No   Shingrix Completed?: Yes  Screening Tests Health Maintenance  Topic Date Due   Medicare Annual Wellness (AWV)  Never done   Lung Cancer Screening  Never done   HEMOGLOBIN A1C  07/27/2022   OPHTHALMOLOGY EXAM  02/11/2023 (Originally 06/14/2022)   MAMMOGRAM  02/18/2023   Diabetic kidney evaluation - eGFR measurement  10/26/2023   Diabetic kidney evaluation - Urine ACR  10/26/2023   FOOT EXAM  10/26/2023   Colonoscopy  07/12/2026   DTaP/Tdap/Td (2 - Td or Tdap) 01/12/2029   DEXA SCAN  Completed   Hepatitis C Screening  Completed   HPV VACCINES  Aged Out   Pneumonia Vaccine 68+ Years old  Discontinued   INFLUENZA VACCINE  Discontinued   COVID-19 Vaccine  Discontinued   Zoster Vaccines-  Shingrix  Discontinued    Health Maintenance  Health Maintenance Due  Topic Date Due   Medicare Annual Wellness (AWV)  Never done   Lung Cancer Screening  Never done   HEMOGLOBIN A1C  07/27/2022    Colorectal cancer screening: Type of screening: Colonoscopy. Completed 07/12/2016. Repeat every 10 years  Mammogram status: Completed 02/17/2021. Repeat every year  Bone Density status: Completed 10/04/2017.   Lung Cancer Screening: (Low Dose CT Chest recommended if Age 62-80 years, 30 pack-year currently smoking OR have quit w/in 15years.) does not qualify.   Lung Cancer Screening Referral: no  Additional Screening:  Hepatitis C Screening: does qualify; Completed 05/08/2018  Vision Screening: Recommended annual ophthalmology exams for early detection of glaucoma and other disorders of the eye. Is the patient up to date with their annual eye exam?  No  Who is the provider or what is the name of the  office in which the patient attends annual eye exams? [facility] If pt is not established with a provider, would they like to be referred to a provider to establish care? No .   Dental Screening: Recommended annual dental exams for proper oral hygiene  Community Resource Referral / Chronic Care Management: CRR required this visit?  No   CCM required this visit?  No      Plan:     I have personally reviewed and noted the following in the patient's chart:   Medical and social history Use of alcohol, tobacco or illicit drugs  Current medications and supplements including opioid prescriptions. Patient is not currently taking opioid prescriptions. Functional ability and status Nutritional status Physical activity Advanced directives List of other physicians Hospitalizations, surgeries, and ER visits in previous 12 months Vitals Screenings to include cognitive, depression, and falls Referrals and appointments  In addition, I have reviewed and discussed with patient certain preventive protocols, quality metrics, and best practice recommendations. A written personalized care plan for preventive services as well as general preventive health recommendations were provided to patient.     Barb Merino, LPN   [id number]/[id number]   Nurse Notes: none  Due to this being a virtual visit, the after visit summary with patients personalized plan was offered to patient via mail or my-chart. Patient would like to access on my-chart

## 2023-01-18 ENCOUNTER — Encounter: Payer: Self-pay | Admitting: *Deleted

## 2023-01-18 NOTE — Progress Notes (Signed)
[facility] Quality Team Note  Name: Laurie Michael Date of Birth: 09-26-1950 MRN: [id number] Date: 01/18/2023  [facility] Quality Team has reviewed this patient's chart, please see recommendations below:  [facility] Quality Other; Pt has open gaps for mammogram, diabetic eye screening, and A1C.  Called pt.  Declined eye event and A1C.   Pt would like to do a mammogram.  Prefers to go to [facility] on [address].

## 2023-02-07 DIAGNOSIS — Z961 Presence of intraocular lens: Secondary | ICD-10-CM | POA: Diagnosis not present

## 2023-02-07 DIAGNOSIS — H04123 Dry eye syndrome of bilateral lacrimal glands: Secondary | ICD-10-CM | POA: Diagnosis not present

## 2023-02-07 DIAGNOSIS — H40053 Ocular hypertension, bilateral: Secondary | ICD-10-CM | POA: Diagnosis not present

## 2023-02-10 DIAGNOSIS — Z0100 Encounter for examination of eyes and vision without abnormal findings: Secondary | ICD-10-CM | POA: Diagnosis not present

## 2023-02-27 ENCOUNTER — Other Ambulatory Visit: Payer: Self-pay | Admitting: Family Medicine

## 2023-02-27 DIAGNOSIS — E119 Type 2 diabetes mellitus without complications: Secondary | ICD-10-CM

## 2023-03-12 ENCOUNTER — Other Ambulatory Visit: Payer: Self-pay | Admitting: Family Medicine

## 2023-03-12 DIAGNOSIS — E039 Hypothyroidism, unspecified: Secondary | ICD-10-CM

## 2023-03-26 DIAGNOSIS — E119 Type 2 diabetes mellitus without complications: Secondary | ICD-10-CM | POA: Diagnosis not present

## 2023-03-26 DIAGNOSIS — E7800 Pure hypercholesterolemia, unspecified: Secondary | ICD-10-CM | POA: Diagnosis not present

## 2023-03-26 DIAGNOSIS — Z1231 Encounter for screening mammogram for malignant neoplasm of breast: Secondary | ICD-10-CM | POA: Diagnosis not present

## 2023-03-26 DIAGNOSIS — R809 Proteinuria, unspecified: Secondary | ICD-10-CM | POA: Diagnosis not present

## 2023-03-26 DIAGNOSIS — E1129 Type 2 diabetes mellitus with other diabetic kidney complication: Secondary | ICD-10-CM | POA: Diagnosis not present

## 2023-03-28 ENCOUNTER — Other Ambulatory Visit: Payer: Self-pay | Admitting: Family Medicine

## 2023-03-28 DIAGNOSIS — Z1231 Encounter for screening mammogram for malignant neoplasm of breast: Secondary | ICD-10-CM

## 2023-04-13 ENCOUNTER — Other Ambulatory Visit: Payer: Self-pay | Admitting: Family Medicine

## 2023-04-13 DIAGNOSIS — E119 Type 2 diabetes mellitus without complications: Secondary | ICD-10-CM

## 2023-04-13 DIAGNOSIS — I10 Essential (primary) hypertension: Secondary | ICD-10-CM

## 2023-04-13 NOTE — Telephone Encounter (Signed)
Requested Prescriptions  Pending Prescriptions Disp Refills   lisinopril (ZESTRIL) 10 MG tablet [Pharmacy Med Name: Lisinopril 10 MG Oral Tablet] 90 tablet 0    Sig: Take 1 tablet by mouth once daily     Cardiovascular:  ACE Inhibitors Passed - 04/13/2023  5:10 PM      Passed - Cr in normal range and within 180 days    Creatinine, Ser  Date Value Ref Range Status  10/26/2022 0.65 0.57 - 1.00 mg/dL Final         Passed - K in normal range and within 180 days    Potassium  Date Value Ref Range Status  10/26/2022 4.9 3.5 - 5.2 mmol/L Final         Passed - Patient is not pregnant      Passed - Last BP in normal range    BP Readings from Last 1 Encounters:  10/26/22 132/83         Passed - Valid encounter within last 6 months    Recent Outpatient Visits           5 months ago Type 2 diabetes mellitus without complication, without long-term current use of insulin (HCC)   [place] [facility], Lauris Poag, MD   10 months ago Chronic bilateral low back pain with bilateral sciatica   [place] [facility], Amy J, NP   11 months ago Type 2 diabetes mellitus without complication, without long-term current use of insulin (HCC)   [place] [facility], MD   1 year ago Carpal tunnel syndrome, bilateral   [place] [facility], [place], PA-C   1 year ago Type 2 diabetes mellitus without complication, without long-term current use of insulin [facility])   [place] [facility], MD       Future Appointments             In 2 weeks Georganna Skeans, MD [facility]

## 2023-04-26 DIAGNOSIS — R809 Proteinuria, unspecified: Secondary | ICD-10-CM | POA: Diagnosis not present

## 2023-04-26 DIAGNOSIS — E119 Type 2 diabetes mellitus without complications: Secondary | ICD-10-CM | POA: Diagnosis not present

## 2023-04-26 DIAGNOSIS — M545 Low back pain, unspecified: Secondary | ICD-10-CM | POA: Diagnosis not present

## 2023-04-26 DIAGNOSIS — F4321 Adjustment disorder with depressed mood: Secondary | ICD-10-CM | POA: Diagnosis not present

## 2023-04-26 DIAGNOSIS — G8929 Other chronic pain: Secondary | ICD-10-CM | POA: Diagnosis not present

## 2023-05-01 ENCOUNTER — Ambulatory Visit
Admission: RE | Admit: 2023-05-01 | Discharge: 2023-05-01 | Disposition: A | Discharge status: Home / Self Care | Payer: Medicare HMO | Source: Ambulatory Visit | Attending: Family Medicine | Admitting: Family Medicine

## 2023-05-01 DIAGNOSIS — Z1231 Encounter for screening mammogram for malignant neoplasm of breast: Secondary | ICD-10-CM

## 2023-05-02 ENCOUNTER — Ambulatory Visit: Payer: Medicare HMO | Admitting: Family Medicine

## 2023-05-02 ENCOUNTER — Telehealth: Payer: Self-pay | Admitting: Family Medicine

## 2023-05-02 NOTE — Telephone Encounter (Signed)
Called patient to reschedule missed appointment; left voice mail.

## 2023-05-24 DIAGNOSIS — E7800 Pure hypercholesterolemia, unspecified: Secondary | ICD-10-CM | POA: Diagnosis not present

## 2023-05-24 DIAGNOSIS — E119 Type 2 diabetes mellitus without complications: Secondary | ICD-10-CM | POA: Diagnosis not present

## 2023-05-24 DIAGNOSIS — E039 Hypothyroidism, unspecified: Secondary | ICD-10-CM | POA: Diagnosis not present

## 2023-05-24 DIAGNOSIS — I10 Essential (primary) hypertension: Secondary | ICD-10-CM | POA: Diagnosis not present

## 2023-05-24 DIAGNOSIS — R809 Proteinuria, unspecified: Secondary | ICD-10-CM | POA: Diagnosis not present

## 2023-05-24 DIAGNOSIS — E1129 Type 2 diabetes mellitus with other diabetic kidney complication: Secondary | ICD-10-CM | POA: Diagnosis not present

## 2023-06-07 DIAGNOSIS — R748 Abnormal levels of other serum enzymes: Secondary | ICD-10-CM | POA: Diagnosis not present

## 2023-06-20 ENCOUNTER — Other Ambulatory Visit: Payer: Self-pay | Admitting: Pharmacist

## 2023-06-20 DIAGNOSIS — E119 Type 2 diabetes mellitus without complications: Secondary | ICD-10-CM

## 2023-06-20 MED ORDER — GLIPIZIDE ER 5 MG PO TB24: 5.0000 mg | ORAL_TABLET | Freq: Every day | ORAL | 0 refills | Status: AC

## 2023-06-20 NOTE — Progress Notes (Signed)
Pharmacy Quality Measure Review  This patient is appearing on a report for being at risk of failing the adherence measure for cholesterol (statin), diabetes, and hypertension (ACEi/ARB) medications this calendar year.   Medication: glipizide 5 mg XL Last fill date: 02/28/23 for 90 day supply  Refills sent. Current rxn at [facility] has 0 refills.  Medication: lisinopril Last fill date: 06/08/2023 for 90 day supply  Insurance report was not up to date. No action needed at this time. ,  Medication: atorvastatin Last fill date: 03/26/23 for 90 day supply  Insurance report was not up to date. No action needed at this time.   Butch Penny, PharmD, Patsy [place], CPP Clinical Pharmacist [facility] [phone]

## 2023-08-02 ENCOUNTER — Encounter: Payer: Self-pay | Admitting: Family Medicine

## 2023-08-02 NOTE — Telephone Encounter (Signed)
 Care team updated and letter sent for eye exam notes.

## 2024-01-08 DIAGNOSIS — E1129 Type 2 diabetes mellitus with other diabetic kidney complication: Secondary | ICD-10-CM | POA: Diagnosis not present

## 2024-01-08 DIAGNOSIS — I10 Essential (primary) hypertension: Secondary | ICD-10-CM | POA: Diagnosis not present

## 2024-01-12 DIAGNOSIS — E039 Hypothyroidism, unspecified: Secondary | ICD-10-CM | POA: Diagnosis not present

## 2024-01-12 DIAGNOSIS — I10 Essential (primary) hypertension: Secondary | ICD-10-CM | POA: Diagnosis not present

## 2024-01-12 DIAGNOSIS — F3341 Major depressive disorder, recurrent, in partial remission: Secondary | ICD-10-CM | POA: Diagnosis not present

## 2024-01-12 DIAGNOSIS — F4321 Adjustment disorder with depressed mood: Secondary | ICD-10-CM | POA: Diagnosis not present

## 2024-02-06 DIAGNOSIS — I10 Essential (primary) hypertension: Secondary | ICD-10-CM | POA: Diagnosis not present

## 2024-02-06 DIAGNOSIS — E1129 Type 2 diabetes mellitus with other diabetic kidney complication: Secondary | ICD-10-CM | POA: Diagnosis not present

## 2024-02-11 DIAGNOSIS — I10 Essential (primary) hypertension: Secondary | ICD-10-CM | POA: Diagnosis not present

## 2024-02-11 DIAGNOSIS — E1129 Type 2 diabetes mellitus with other diabetic kidney complication: Secondary | ICD-10-CM | POA: Diagnosis not present

## 2024-03-07 DIAGNOSIS — I10 Essential (primary) hypertension: Secondary | ICD-10-CM | POA: Diagnosis not present

## 2024-03-07 DIAGNOSIS — E1129 Type 2 diabetes mellitus with other diabetic kidney complication: Secondary | ICD-10-CM | POA: Diagnosis not present

## 2024-03-12 ENCOUNTER — Ambulatory Visit: Admitting: Family Medicine

## 2024-03-12 ENCOUNTER — Encounter: Payer: Self-pay | Admitting: Family Medicine

## 2024-03-12 VITALS — BP 137/84 | HR 89 | Ht 62.0 in | Wt 159.4 lb

## 2024-03-12 DIAGNOSIS — E119 Type 2 diabetes mellitus without complications: Secondary | ICD-10-CM

## 2024-03-12 DIAGNOSIS — R197 Diarrhea, unspecified: Secondary | ICD-10-CM

## 2024-03-12 DIAGNOSIS — M79642 Pain in left hand: Secondary | ICD-10-CM | POA: Diagnosis not present

## 2024-03-12 LAB — POCT GLYCOSYLATED HEMOGLOBIN (HGB A1C): HbA1c, POC (controlled diabetic range): 5.7 % (ref 0.0–7.0)

## 2024-03-12 MED ORDER — TRIAMCINOLONE ACETONIDE 40 MG/ML IJ SUSP
40.0000 mg | Freq: Once | INTRAMUSCULAR | Status: AC
Start: 2024-03-12 — End: 2024-03-12
  Administered 2024-03-12: 40 mg via INTRAMUSCULAR

## 2024-03-12 NOTE — Progress Notes (Unsigned)
 Established Patient Office Visit  Subjective    Patient ID: Laurie Michael, female    DOB: Mar 03, 1951  Age: 73 y.o. MRN: [id number]  CC:  Chief Complaint  Patient presents with   Carpal Tunnel    Left hand has been in pain.     HPI Laurie Michael presents for left wrist pain for a couple of months. Also with some swelling. Paitent is right hand dominant. She wears a brace for support. Patient has also been having falls.   Outpatient Encounter Medications as of 03/12/2024  Medication Sig   atorvastatin  (LIPITOR) 80 MG tablet TAKE 1 TABLET EVERY DAY AT 6 PM   Biotin (BIOTIN MAXIMUM STRENGTH) 10 MG TABS Take by mouth.   calcium -vitamin D (OSCAL WITH D) 500-200 MG-UNIT tablet Take 2 tablets by mouth daily.   levothyroxine  (SYNTHROID ) 75 MCG tablet Take 1 tablet by mouth once daily   lisinopril  (ZESTRIL ) 10 MG tablet Take 1 tablet by mouth once daily   magnesium 30 MG tablet Take 30 mg by mouth 2 (two) times daily.   metFORMIN  (GLUCOPHAGE ) 1000 MG tablet TAKE 1 TABLET BY MOUTH TWICE DAILY WITH A MEAL   Ascorbic Acid (VITAMIN C) 1000 MG tablet Take 1,000 mg by mouth daily.   Blood Glucose Monitoring Suppl (TRUE METRIX METER) w/Device KIT USE AS DIRECTED   Calcium  Magnesium Zinc 333-133-5 MG TABS Take 1 capsule by mouth daily.   Cholecalciferol 50 MCG (2000 UT) CAPS Take by mouth.   Cyanocobalamin 2500 MCG TABS Take 1 tablet by mouth daily.   cyclobenzaprine  (FLEXERIL ) 10 MG tablet Take by mouth. (Patient not taking: Reported on 01/04/2023)   erythromycin base (E-MYCIN) 500 MG tablet  (Patient not taking: Reported on 01/04/2023)   estradiol (ESTRACE VAGINAL) 0.1 MG/GM vaginal cream Insert 0.5 g every other day by vaginal route. (Patient not taking: Reported on 01/04/2023)   gabapentin  (NEURONTIN ) 300 MG capsule Take 1 capsule (300 mg total) by mouth 3 (three) times daily. (Patient not taking: Reported on 01/04/2023)   glipiZIDE  (GLUCOTROL  XL) 5 MG 24 hr tablet Take 1 tablet (5 mg total) by  mouth daily.   glucose blood (TRUE METRIX BLOOD GLUCOSE TEST) test strip Use to check blood sugar once daily. Diagnosis code E11.9   hydrochlorothiazide  (HYDRODIURIL ) 25 MG tablet    levothyroxine  (SYNTHROID ) 50 MCG tablet  (Patient not taking: Reported on 01/04/2023)   neomycin (MYCIFRADIN) 500 MG tablet  (Patient not taking: Reported on 01/04/2023)   Propylene Glycol (SYSTANE BALANCE) 0.6 % SOLN Apply to eye. (Patient not taking: Reported on 01/04/2023)   Soy Isoflavone 40 MG TABS Take by mouth. (Patient not taking: Reported on 01/04/2023)   traMADol  (ULTRAM ) 50 MG tablet Take 1 tablet (50 mg total) by mouth every 6 (six) hours as needed. TAKE 1 TABLET BY MOUTH EVERY 4 HOURS AS NEEDED FOR UP TO 5 DAYS (Patient not taking: Reported on 01/04/2023)   TRUEplus Lancets 30G MISC Use to check blood sugar once daily. Diagnosis code E11.9   Vitamin D, Ergocalciferol, (DRISDOL) 1.25 MG (50000 UNIT) CAPS capsule Take 50,000 Units by mouth every 7 (seven) days.   No facility-administered encounter medications on file as of 03/12/2024.    Past Medical History:  Diagnosis Date   Complication of anesthesia    Diabetes mellitus without complication (HCC)    Diverticulosis 08/2018   partial colectomy   Family history of adverse reaction to anesthesia    mom is slow to wake up  Hypertension    Hypothyroidism    Irritable bowel    Obesity    PONV (postoperative nausea and vomiting)     Past Surgical History:  Procedure Laterality Date   CATARACT EXTRACTION, BILATERAL  Mar 20, 2016   COLON SURGERY  08/2018   partial colectomy for diverticulosis   COLONOSCOPY N/A 07/12/2016   Procedure: COLONOSCOPY;  Surgeon: Lesta JULIANNA Fitz, MD;  Location: [facility] END[facility]COPY;  Service: Endoscopy;  Laterality: N/A;   HYSTEROSCOPY W/ ENDOMETRIAL ABLATION     SHOULDER ARTHROSCOPY WITH DISTAL CLAVICLE RESECTION Right 01/31/2019   Procedure: SHOULDER ARTHROSCOPY WITH DISTAL CLAVICLE RESECTION;  Surgeon: Shari Sieving, MD;  Location:  [facility];  Service: Orthopedics;  Laterality: Right;   SHOULDER ARTHROSCOPY WITH SUBACROMIAL DECOMPRESSION Right 01/31/2019   Procedure: SHOULDER ARTHROSCOPY WITH SUBACROMIAL DECOMPRESSION;  Surgeon: Shari Sieving, MD;  Location: [facility];  Service: Orthopedics;  Laterality: Right;   TUBAL LIGATION     WISDOM TOOTH EXTRACTION      Family History  Problem Relation Age of Onset   Diabetes Mother    Breast cancer Mother    Heart disease Father    Cancer Brother    Breast cancer Sister     Social History   Socioeconomic History   Marital status: Widowed    Spouse name: Not on file   Number of children: Not on file   Years of education: Not on file   Highest education level: Not on file  Occupational History   Not on file  Tobacco Use   Smoking status: Former    Current packs/day: 0.00    Average packs/day: 2.0 packs/day for 27.0 years (54.0 ttl pk-yrs)    Types: Cigarettes    Start date: 01/10/1986    Quit date: 01/10/2013    Years since quitting: 11.1    Passive exposure: Past   Smokeless tobacco: Never  Vaping Use   Vaping status: Never Used  Substance and Sexual Activity   Alcohol use: No    Alcohol/week: 0.0 standard drinks of alcohol   Drug use: No   Sexual activity: Yes    Birth control/protection: None, Post-menopausal  Other Topics Concern   Not on file  Social History Narrative   Pt reports her husband passed away in Mar 21, 2023.    Social Drivers of Corporate investment banker Strain: Not on file  Food Insecurity: No Food Insecurity (01/04/2023)   Hunger Vital Sign    Worried About Running Out of Food in the Last Year: Never true    Ran Out of Food in the Last Year: Never true  Transportation Needs: No Transportation Needs (01/04/2023)   PRAPARE - Administrator, Civil Service (Medical): No    Lack of Transportation (Non-Medical): No  Physical Activity: Inactive (01/04/2023)   Exercise Vital Sign    Days of Exercise  per Week: 0 days    Minutes of Exercise per Session: 0 min  Stress: Stress Concern Present (01/04/2023)   [employer] of Occupational Health - Occupational Stress Questionnaire    Feeling of Stress : To some extent  Social Connections: Not on file  Intimate Partner Violence: Not on file    ROS      Objective    BP 137/84   Pulse 89   Ht 5' 2 (1.575 m)   Wt 159 lb 6.4 oz (72.3 kg)   SpO2 93%   BMI 29.15 kg/m   Physical Exam  {Labs (Optional):23779}  Assessment & Plan:   Left hand pain     No follow-ups on file.   Tanda Raguel SQUIBB, MD

## 2024-03-13 ENCOUNTER — Encounter: Payer: Self-pay | Admitting: Family Medicine

## 2024-03-13 DIAGNOSIS — F3341 Major depressive disorder, recurrent, in partial remission: Secondary | ICD-10-CM | POA: Diagnosis not present

## 2024-03-13 DIAGNOSIS — E1129 Type 2 diabetes mellitus with other diabetic kidney complication: Secondary | ICD-10-CM | POA: Diagnosis not present

## 2024-03-13 DIAGNOSIS — I10 Essential (primary) hypertension: Secondary | ICD-10-CM | POA: Diagnosis not present

## 2024-03-13 DIAGNOSIS — E039 Hypothyroidism, unspecified: Secondary | ICD-10-CM | POA: Diagnosis not present

## 2024-03-13 DIAGNOSIS — F4321 Adjustment disorder with depressed mood: Secondary | ICD-10-CM | POA: Diagnosis not present

## 2024-04-24 ENCOUNTER — Telehealth: Payer: Self-pay | Admitting: Family Medicine

## 2024-04-24 NOTE — Telephone Encounter (Unsigned)
 Copied from CRM [id number]. Topic: Clinical - Medication Refill >> Apr 24, 2024  5:34 PM Zebedee SAUNDERS wrote: Medication: Sertraline 50 mg   Has the patient contacted their pharmacy? Yes (Agent: If no, request that the patient contact the pharmacy for the refill. If patient does not wish to contact the pharmacy document the reason why and proceed with request.) (Agent: If yes, when and what did the pharmacy advise?)Pharmacy need script  This is the patient's preferred pharmacy:  [facility] [address] ([address] [place] - [address] [place] ([place]) [place] [address] Phone: [phone] Fax: [phone]  Is this the correct pharmacy for this prescription? Yes If no, delete pharmacy and type the correct one.   Has the prescription been filled recently? Yes  Is the patient out of the medication? Yes  Has the patient been seen for an appointment in the last year OR does the patient have an upcoming appointment? Yes  Can we respond through MyChart? Yes  Agent: Please be advised that Rx refills may take up to 3 business days. We ask that you follow-up with your pharmacy.

## 2024-04-24 NOTE — Telephone Encounter (Signed)
 Patient requesting sertraline, 50 mg, not on profile

## 2024-04-25 ENCOUNTER — Other Ambulatory Visit: Payer: Self-pay | Admitting: Family Medicine

## 2024-04-25 NOTE — Telephone Encounter (Signed)
 Dr Prentice Maffucci is the prescribing doctor for this medication. Please advise.

## 2024-04-25 NOTE — Telephone Encounter (Signed)
 I tried to call pt for clarification. No answer, unable to LVM. Sent pt a mychart message asking for a call back

## 2024-04-28 ENCOUNTER — Telehealth: Payer: Self-pay | Admitting: Family Medicine

## 2024-04-28 NOTE — Telephone Encounter (Signed)
 Copied from CRM [id number]. Topic: Clinical - Medication Refill >> Apr 28, 2024  5:06 PM Delon DASEN wrote: Medication: sertraline - was prescribed by Dr Marvene, she does not see him anymore  Has the patient contacted their pharmacy? No (Agent: If no, request that the patient contact the pharmacy for the refill. If patient does not wish to contact the pharmacy document the reason why and proceed with request.) (Agent: If yes, when and what did the pharmacy advise?)  This is the patient's preferred pharmacy:  [facility] [address] ([address] [place] - [address] [place] ([place]) [place] [address] Phone: [phone] Fax: [phone]    Is this the correct pharmacy for this prescription? Yes If no, delete pharmacy and type the correct one.   Has the prescription been filled recently? Yes  Is the patient out of the medication? Yes  Has the patient been seen for an appointment in the last year OR does the patient have an upcoming appointment? Yes  Can we respond through MyChart? Yes  Agent: Please be advised that Rx refills may take up to 3 business days. We ask that you follow-up with your pharmacy.

## 2024-04-28 NOTE — Telephone Encounter (Signed)
 Patient requesting historical med. Please see below. No answer when attempt to reach patient.  Copied from CRM [id number]. Topic: Clinical - Medication Refill >> Apr 28, 2024  5:06 PM Laurie Michael wrote: Medication: sertraline - was prescribed by Dr Marvene, she does not see him anymore   Has the patient contacted their pharmacy? No (Agent: If no, request that the patient contact the pharmacy for the refill. If patient does not wish to contact the pharmacy document the reason why and proceed with request.) (Agent: If yes, when and what did the pharmacy advise?)   This is the patient's preferred pharmacy:  [facility] [address] ([address]), [place] - [address] [place] ([place]) [place] [address] Phone: [phone] Fax: [phone]

## 2024-04-29 ENCOUNTER — Other Ambulatory Visit: Payer: Self-pay | Admitting: Family Medicine

## 2024-04-29 ENCOUNTER — Encounter: Payer: Self-pay | Admitting: Gastroenterology

## 2024-04-29 MED ORDER — SERTRALINE HCL 50 MG PO TABS: 50.0000 mg | ORAL_TABLET | Freq: Every day | ORAL | 0 refills | Status: AC

## 2024-04-29 NOTE — Progress Notes (Signed)
 Laurie Michael

## 2024-04-29 NOTE — Telephone Encounter (Signed)
 Dr. Tanda is aware of this request, waiting on her to review and advise

## 2024-05-05 DIAGNOSIS — Z961 Presence of intraocular lens: Secondary | ICD-10-CM | POA: Diagnosis not present

## 2024-05-05 DIAGNOSIS — H04123 Dry eye syndrome of bilateral lacrimal glands: Secondary | ICD-10-CM | POA: Diagnosis not present

## 2024-06-12 ENCOUNTER — Encounter: Payer: Self-pay | Admitting: Family Medicine

## 2024-06-12 ENCOUNTER — Ambulatory Visit: Admitting: Family Medicine

## 2024-06-12 VITALS — BP 128/80 | HR 92 | Ht 62.0 in | Wt 155.0 lb

## 2024-06-12 DIAGNOSIS — E039 Hypothyroidism, unspecified: Secondary | ICD-10-CM | POA: Diagnosis not present

## 2024-06-12 DIAGNOSIS — Z23 Encounter for immunization: Secondary | ICD-10-CM

## 2024-06-12 DIAGNOSIS — Z7984 Long term (current) use of oral hypoglycemic drugs: Secondary | ICD-10-CM

## 2024-06-12 DIAGNOSIS — I10 Essential (primary) hypertension: Secondary | ICD-10-CM | POA: Diagnosis not present

## 2024-06-12 DIAGNOSIS — E119 Type 2 diabetes mellitus without complications: Secondary | ICD-10-CM | POA: Diagnosis not present

## 2024-06-12 NOTE — Progress Notes (Signed)
 Established Patient Office Visit  Subjective    Patient ID: Laurie Michael, female    DOB: 28-Apr-1951  Age: 73 y.o. MRN: [id number]  CC:  Chief Complaint  Patient presents with   Medical Management of Chronic Issues    HPI Laurie Michael presents for routine follow up of hypertension, diabetes, and hypothyroidism. Patient reports med compliance and denies acute complaints.   Outpatient Encounter Medications as of 06/12/2024  Medication Sig   Ascorbic Acid (VITAMIN C) 1000 MG tablet Take 1,000 mg by mouth daily.   atorvastatin  (LIPITOR) 80 MG tablet TAKE 1 TABLET EVERY DAY AT 6 PM   Biotin (BIOTIN MAXIMUM STRENGTH) 10 MG TABS Take by mouth.   Blood Glucose Monitoring Suppl (TRUE METRIX METER) w/Device KIT USE AS DIRECTED   Calcium  Magnesium Zinc 333-133-5 MG TABS Take 1 capsule by mouth daily.   calcium -vitamin D (OSCAL WITH D) 500-200 MG-UNIT tablet Take 2 tablets by mouth daily.   Cholecalciferol 50 MCG (2000 UT) CAPS Take by mouth.   Cyanocobalamin 2500 MCG TABS Take 1 tablet by mouth daily.   glucose blood (TRUE METRIX BLOOD GLUCOSE TEST) test strip Use to check blood sugar once daily. Diagnosis code E11.9   levothyroxine  (SYNTHROID ) 75 MCG tablet Take 1 tablet by mouth once daily   lisinopril  (ZESTRIL ) 10 MG tablet Take 1 tablet by mouth once daily   magnesium 30 MG tablet Take 30 mg by mouth 2 (two) times daily.   metFORMIN  (GLUCOPHAGE ) 1000 MG tablet TAKE 1 TABLET BY MOUTH TWICE DAILY WITH A MEAL   sertraline  (ZOLOFT ) 50 MG tablet Take 1 tablet (50 mg total) by mouth daily.   TRUEplus Lancets 30G MISC Use to check blood sugar once daily. Diagnosis code E11.9   cyclobenzaprine  (FLEXERIL ) 10 MG tablet Take by mouth. (Patient not taking: Reported on 01/04/2023)   erythromycin base (E-MYCIN) 500 MG tablet  (Patient not taking: Reported on 01/04/2023)   estradiol (ESTRACE VAGINAL) 0.1 MG/GM vaginal cream Insert 0.5 g every other day by vaginal route. (Patient not taking:  Reported on 01/04/2023)   gabapentin  (NEURONTIN ) 300 MG capsule Take 1 capsule (300 mg total) by mouth 3 (three) times daily. (Patient not taking: Reported on 01/04/2023)   glipiZIDE  (GLUCOTROL  XL) 5 MG 24 hr tablet Take 1 tablet (5 mg total) by mouth daily. (Patient not taking: Reported on 06/12/2024)   hydrochlorothiazide  (HYDRODIURIL ) 25 MG tablet  (Patient not taking: Reported on 06/12/2024)   levothyroxine  (SYNTHROID ) 50 MCG tablet  (Patient not taking: Reported on 01/04/2023)   neomycin (MYCIFRADIN) 500 MG tablet  (Patient not taking: Reported on 01/04/2023)   Propylene Glycol (SYSTANE BALANCE) 0.6 % SOLN Apply to eye. (Patient not taking: Reported on 01/04/2023)   Soy Isoflavone 40 MG TABS Take by mouth. (Patient not taking: Reported on 01/04/2023)   traMADol  (ULTRAM ) 50 MG tablet Take 1 tablet (50 mg total) by mouth every 6 (six) hours as needed. TAKE 1 TABLET BY MOUTH EVERY 4 HOURS AS NEEDED FOR UP TO 5 DAYS (Patient not taking: Reported on 01/04/2023)   Vitamin D, Ergocalciferol, (DRISDOL) 1.25 MG (50000 UNIT) CAPS capsule Take 50,000 Units by mouth every 7 (seven) days.   No facility-administered encounter medications on file as of 06/12/2024.    Past Medical History:  Diagnosis Date   Complication of anesthesia    Diabetes mellitus without complication (HCC)    Diverticulosis 08/2018   partial colectomy   Family history of adverse reaction to anesthesia  mom is slow to wake up   Hypertension    Hypothyroidism    Irritable bowel    Obesity    PONV (postoperative nausea and vomiting)     Past Surgical History:  Procedure Laterality Date   CATARACT EXTRACTION, BILATERAL  06-22-16   COLON SURGERY  08/2018   partial colectomy for diverticulosis   COLONOSCOPY N/A 07/12/2016   Procedure: COLONOSCOPY;  Surgeon: Lesta JULIANNA Fitz, MD;  Location: [facility];  Service: Endoscopy;  Laterality: N/A;   HYSTEROSCOPY W/ ENDOMETRIAL ABLATION     SHOULDER ARTHROSCOPY WITH DISTAL CLAVICLE  RESECTION Right 01/31/2019   Procedure: SHOULDER ARTHROSCOPY WITH DISTAL CLAVICLE RESECTION;  Surgeon: Shari Sieving, MD;  Location: [facility];  Service: Orthopedics;  Laterality: Right;   SHOULDER ARTHROSCOPY WITH SUBACROMIAL DECOMPRESSION Right 01/31/2019   Procedure: SHOULDER ARTHROSCOPY WITH SUBACROMIAL DECOMPRESSION;  Surgeon: Shari Sieving, MD;  Location: [facility];  Service: Orthopedics;  Laterality: Right;   TUBAL LIGATION     WISDOM TOOTH EXTRACTION      Family History  Problem Relation Age of Onset   Diabetes Mother    Breast cancer Mother    Heart disease Father    Cancer Brother    Breast cancer Sister     Social History   Socioeconomic History   Marital status: Widowed    Spouse name: Not on file   Number of children: Not on file   Years of education: Not on file   Highest education level: Not on file  Occupational History   Not on file  Tobacco Use   Smoking status: Former    Current packs/day: 0.00    Average packs/day: 2.0 packs/day for 27.0 years (54.0 ttl pk-yrs)    Types: Cigarettes    Start date: 01/10/1986    Quit date: 01/10/2013    Years since quitting: 11.4    Passive exposure: Past   Smokeless tobacco: Never  Vaping Use   Vaping status: Never Used  Substance and Sexual Activity   Alcohol use: No    Alcohol/week: 0.0 standard drinks of alcohol   Drug use: No   Sexual activity: Yes    Birth control/protection: None, Post-menopausal  Other Topics Concern   Not on file  Social History Narrative   Pt reports her husband passed away in 06/23/23.    Social Drivers of Corporate Investment Banker Strain: Low Risk  (06/12/2024)   Overall Financial Resource Strain (CARDIA)    Difficulty of Paying Living Expenses: Not hard at all  Food Insecurity: No Food Insecurity (01/04/2023)   Hunger Vital Sign    Worried About Running Out of Food in the Last Year: Never true    Ran Out of Food in the Last Year: Never true   Transportation Needs: No Transportation Needs (01/04/2023)   PRAPARE - Administrator, Civil Service (Medical): No    Lack of Transportation (Non-Medical): No  Physical Activity: Inactive (01/04/2023)   Exercise Vital Sign    Days of Exercise per Week: 0 days    Minutes of Exercise per Session: 0 min  Stress: Stress Concern Present (06/12/2024)   [employer] of Occupational Health - Occupational Stress Questionnaire    Feeling of Stress: To some extent  Social Connections: Moderately Isolated (06/12/2024)   Social Connection and Isolation Panel    Frequency of Communication with Friends and Family: More than three times a week    Frequency of Social Gatherings with Friends and Family:  Once a week    Attends Religious Services: More than 4 times per year    Active Member of Clubs or Organizations: No    Attends Banker Meetings: Never    Marital Status: Widowed  Intimate Partner Violence: Not on file    Review of Systems  All other systems reviewed and are negative.       Objective    BP 128/80   Pulse 92   Ht 5' 2 (1.575 m)   Wt 155 lb (70.3 kg)   SpO2 96%   BMI 28.35 kg/m   Physical Exam Vitals and nursing note reviewed.  Constitutional:      General: She is not in acute distress. Cardiovascular:     Rate and Rhythm: Normal rate and regular rhythm.  Pulmonary:     Effort: Pulmonary effort is normal.     Breath sounds: Normal breath sounds.  Abdominal:     Palpations: Abdomen is soft.     Tenderness: There is no abdominal tenderness.  Neurological:     General: No focal deficit present.     Mental Status: She is alert and oriented to person, place, and time.         Assessment & Plan:   Type 2 diabetes mellitus without complication, without long-term current use of insulin (HCC) -     Lipid panel -     Comprehensive metabolic panel with GFR  Essential hypertension -     Comprehensive metabolic panel with  GFR  Hypothyroidism, unspecified type -     TSH + free T4  Encounter for immunization -     Flu vaccine HIGH DOSE PF(Fluzone Trivalent)     Return in about 6 months (around 12/11/2024).   Tanda Raguel SQUIBB, MD

## 2024-06-13 LAB — COMPREHENSIVE METABOLIC PANEL WITH GFR
ALT: 17 IU/L (ref 0–32)
AST: 16 IU/L (ref 0–40)
Albumin: 3.9 g/dL (ref 3.8–4.8)
Alkaline Phosphatase: 183 IU/L — ABNORMAL HIGH (ref 49–135)
BUN/Creatinine Ratio: 24 (ref 12–28)
BUN: 16 mg/dL (ref 8–27)
Bilirubin Total: 0.2 mg/dL (ref 0.0–1.2)
CO2: 23 mmol/L (ref 20–29)
Calcium: 9.7 mg/dL (ref 8.7–10.3)
Chloride: 102 mmol/L (ref 96–106)
Creatinine, Ser: 0.68 mg/dL (ref 0.57–1.00)
Globulin, Total: 2.6 g/dL (ref 1.5–4.5)
Glucose: 89 mg/dL (ref 70–99)
Potassium: 5.4 mmol/L — ABNORMAL HIGH (ref 3.5–5.2)
Sodium: 139 mmol/L (ref 134–144)
Total Protein: 6.5 g/dL (ref 6.0–8.5)
eGFR: 92 mL/min/1.73 (ref >59)

## 2024-06-13 LAB — TSH+FREE T4
Free T4: 1.36 ng/dL (ref 0.82–1.77)
TSH: 0.404 u[IU]/mL — ABNORMAL LOW (ref 0.450–4.500)

## 2024-06-13 LAB — LIPID PANEL
Chol/HDL Ratio: 2.3 ratio (ref 0.0–4.4)
Cholesterol, Total: 127 mg/dL (ref 100–199)
HDL: 56 mg/dL (ref >39)
LDL Chol Calc (NIH): 52 mg/dL (ref 0–99)
Triglycerides: 101 mg/dL (ref 0–149)
VLDL Cholesterol Cal: 19 mg/dL (ref 5–40)

## 2024-06-19 ENCOUNTER — Ambulatory Visit: Admitting: Physician Assistant

## 2024-06-19 ENCOUNTER — Encounter: Payer: Self-pay | Admitting: Physician Assistant

## 2024-06-19 ENCOUNTER — Ambulatory Visit (INDEPENDENT_AMBULATORY_CARE_PROVIDER_SITE_OTHER)
Admission: RE | Admit: 2024-06-19 | Discharge: 2024-06-19 | Disposition: A | Discharge status: Home / Self Care | Source: Ambulatory Visit | Attending: Physician Assistant | Admitting: Physician Assistant

## 2024-06-19 VITALS — BP 122/64 | HR 74 | Ht 62.0 in | Wt 155.0 lb

## 2024-06-19 DIAGNOSIS — Z9049 Acquired absence of other specified parts of digestive tract: Secondary | ICD-10-CM | POA: Diagnosis not present

## 2024-06-19 DIAGNOSIS — Z9889 Other specified postprocedural states: Secondary | ICD-10-CM

## 2024-06-19 DIAGNOSIS — K5900 Constipation, unspecified: Secondary | ICD-10-CM | POA: Diagnosis not present

## 2024-06-19 DIAGNOSIS — R194 Change in bowel habit: Secondary | ICD-10-CM

## 2024-06-19 NOTE — Progress Notes (Signed)
 Chief Complaint: Diarrhea  HPI:    Laurie Michael is a 73 year old Caucasian female with a past medical history as listed below including diabetes, hypothyroidism and obesity, who was referred to me by Tanda Bleacher, MD for a complaint of diarrhea.      07/12/2016 colonoscopy with Dr. Lennard with diverticulosis in the sigmoid colon, one 3 mm polyp in the sigmoid colon, one 5 mm polyp at the rectosigmoid colon otherwise normal.  Pathology showed tubular adenoma and hyperplastic polyp.  Repeat recommended in 10 years.    06/26/2018 patient seen by [facility].  At that time discussed sigmoid diverticular disease and a colovaginal fistula.  Apparently recent colonoscopy had failed and could not traverse the sigmoid colon which was fixed with severe diverticular disease.  Hyperplastic sigmoid polyp was removed.  Follow-up CT colonoscopy was obtained with mural thickening in the sigmoid with associated severe diverticular disease and known colovaginal fistula.  Probable 9 mm polyp in the ascending colon but no worrisome mass lesion seen.  At that time recommended a vaginal wall repair and excision of the diseased sigmoid area.  She had been experiencing obstructive symptoms given severe narrowing of the sigmoid colon.  Then recommended repeat colonoscopy in 6 months.    08/16/2018 patient had rectosigmoid anterior resection with end anastomosis and bilateral ureteral stents for rectovaginal fistula.    06/12/2024 CMP with a potassium elevated at 5.4 and alk phos 183 (around her baseline over the past 5 years) otherwise normal.  TSH low at 0.404.  Free T4 normal.  Hemoglobin A1c 6.3.    06/12/2024 patient seen by PCP and at that time doing well.  No mention of diarrhea.    Today, the patient tells me that ever since her colon surgery in 2020 she has been told to use Metamucil at night.  She really does not like to take it but has been doing better about this over the past year or so.  Tells me that most  recently she has noticed a change in her stool describing that she will often have to strain to have a bowel movement and produce little small pebbles of stool and then the next day can have a solid stool followed by episodes of diarrhea or sometimes just gets a gush of diarrhea.  Occasionally feels a left upper quadrant hardness and discomfort that feels better after a bowel movement.  No blood in her stool.    Patient tells me she knows she was supposed to follow-up with GI for repeat colonoscopy but she is troubled by urgent diarrhea when she drinks a bowel prep and really does not want to have to have another one.    Denies fever, chills, weight loss or blood in her stool.  Past Medical History:  Diagnosis Date   Complication of anesthesia    Diabetes mellitus without complication (HCC)    Diverticulosis 08/2018   partial colectomy   Family history of adverse reaction to anesthesia    mom is slow to wake up   Hypertension    Hypothyroidism    Irritable bowel    Obesity    PONV (postoperative nausea and vomiting)     Past Surgical History:  Procedure Laterality Date   CATARACT EXTRACTION, BILATERAL  2017   COLON SURGERY  08/2018   partial colectomy for diverticulosis   COLONOSCOPY N/A 07/12/2016   Procedure: COLONOSCOPY;  Surgeon: Lesta JULIANNA Lennard, MD;  Location: [facility] END[facility]COPY;  Service: Endoscopy;  Laterality: N/A;  HYSTEROSCOPY W/ ENDOMETRIAL ABLATION     SHOULDER ARTHROSCOPY WITH DISTAL CLAVICLE RESECTION Right 01/31/2019   Procedure: SHOULDER ARTHROSCOPY WITH DISTAL CLAVICLE RESECTION;  Surgeon: Shari Sieving, MD;  Location: [facility];  Service: Orthopedics;  Laterality: Right;   SHOULDER ARTHROSCOPY WITH SUBACROMIAL DECOMPRESSION Right 01/31/2019   Procedure: SHOULDER ARTHROSCOPY WITH SUBACROMIAL DECOMPRESSION;  Surgeon: Shari Sieving, MD;  Location: [facility];  Service: Orthopedics;  Laterality: Right;   TUBAL LIGATION     WISDOM TOOTH  EXTRACTION      Current Outpatient Medications  Medication Sig Dispense Refill   Ascorbic Acid (VITAMIN C) 1000 MG tablet Take 1,000 mg by mouth daily.     atorvastatin  (LIPITOR) 80 MG tablet TAKE 1 TABLET EVERY DAY AT 6 PM 90 tablet 1   Biotin (BIOTIN MAXIMUM STRENGTH) 10 MG TABS Take by mouth.     Blood Glucose Monitoring Suppl (TRUE METRIX METER) w/Device KIT USE AS DIRECTED 1 kit 0   Calcium  Magnesium Zinc 333-133-5 MG TABS Take 1 capsule by mouth daily.     calcium -vitamin D (OSCAL WITH D) 500-200 MG-UNIT tablet Take 2 tablets by mouth daily.     Cholecalciferol 50 MCG (2000 UT) CAPS Take by mouth.     Cyanocobalamin 2500 MCG TABS Take 1 tablet by mouth daily.     cyclobenzaprine  (FLEXERIL ) 10 MG tablet Take by mouth. (Patient not taking: Reported on 01/04/2023)     erythromycin base (E-MYCIN) 500 MG tablet  (Patient not taking: Reported on 01/04/2023)     estradiol (ESTRACE VAGINAL) 0.1 MG/GM vaginal cream Insert 0.5 g every other day by vaginal route. (Patient not taking: Reported on 01/04/2023)     gabapentin  (NEURONTIN ) 300 MG capsule Take 1 capsule (300 mg total) by mouth 3 (three) times daily. (Patient not taking: Reported on 01/04/2023) 90 capsule 1   glipiZIDE  (GLUCOTROL  XL) 5 MG 24 hr tablet Take 1 tablet (5 mg total) by mouth daily. (Patient not taking: Reported on 06/12/2024) 90 tablet 0   glucose blood (TRUE METRIX BLOOD GLUCOSE TEST) test strip Use to check blood sugar once daily. Diagnosis code E11.9 100 each 2   hydrochlorothiazide  (HYDRODIURIL ) 25 MG tablet  (Patient not taking: Reported on 06/12/2024)     levothyroxine  (SYNTHROID ) 50 MCG tablet  (Patient not taking: Reported on 01/04/2023)     levothyroxine  (SYNTHROID ) 75 MCG tablet Take 1 tablet by mouth once daily 90 tablet 0   lisinopril  (ZESTRIL ) 10 MG tablet Take 1 tablet by mouth once daily 90 tablet 0   magnesium 30 MG tablet Take 30 mg by mouth 2 (two) times daily.     metFORMIN  (GLUCOPHAGE ) 1000 MG tablet TAKE 1  TABLET BY MOUTH TWICE DAILY WITH A MEAL 180 tablet 0   neomycin (MYCIFRADIN) 500 MG tablet  (Patient not taking: Reported on 01/04/2023)     Propylene Glycol (SYSTANE BALANCE) 0.6 % SOLN Apply to eye. (Patient not taking: Reported on 01/04/2023)     sertraline  (ZOLOFT ) 50 MG tablet Take 1 tablet (50 mg total) by mouth daily. 90 tablet 0   Soy Isoflavone 40 MG TABS Take by mouth. (Patient not taking: Reported on 01/04/2023)     traMADol  (ULTRAM ) 50 MG tablet Take 1 tablet (50 mg total) by mouth every 6 (six) hours as needed. TAKE 1 TABLET BY MOUTH EVERY 4 HOURS AS NEEDED FOR UP TO 5 DAYS (Patient not taking: Reported on 01/04/2023) 20 tablet 0   TRUEplus Lancets 30G MISC Use to check  blood sugar once daily. Diagnosis code E11.9 100 each 2   Vitamin D, Ergocalciferol, (DRISDOL) 1.25 MG (50000 UNIT) CAPS capsule Take 50,000 Units by mouth every 7 (seven) days.     No current facility-administered medications for this visit.    Allergies as of 06/19/2024   (No Known Allergies)    Family History  Problem Relation Age of Onset   Diabetes Mother    Breast cancer Mother    Heart disease Father    Cancer Brother    Breast cancer Sister     Social History   Socioeconomic History   Marital status: Widowed    Spouse name: Not on file   Number of children: Not on file   Years of education: Not on file   Highest education level: Not on file  Occupational History   Not on file  Tobacco Use   Smoking status: Former    Current packs/day: 0.00    Average packs/day: 2.0 packs/day for 27.0 years (54.0 ttl pk-yrs)    Types: Cigarettes    Start date: 01/10/1986    Quit date: 01/10/2013    Years since quitting: 11.4    Passive exposure: Past   Smokeless tobacco: Never  Vaping Use   Vaping status: Never Used  Substance and Sexual Activity   Alcohol use: No    Alcohol/week: 0.0 standard drinks of alcohol   Drug use: No   Sexual activity: Yes    Birth control/protection: None, Post-menopausal   Other Topics Concern   Not on file  Social History Narrative   Pt reports her husband passed away in Jun 26, 2023.    Social Drivers of Corporate Investment Banker Strain: Low Risk  (06/12/2024)   Overall Financial Resource Strain (CARDIA)    Difficulty of Paying Living Expenses: Not hard at all  Food Insecurity: No Food Insecurity (01/04/2023)   Hunger Vital Sign    Worried About Running Out of Food in the Last Year: Never true    Ran Out of Food in the Last Year: Never true  Transportation Needs: No Transportation Needs (01/04/2023)   PRAPARE - Administrator, Civil Service (Medical): No    Lack of Transportation (Non-Medical): No  Physical Activity: Inactive (01/04/2023)   Exercise Vital Sign    Days of Exercise per Week: 0 days    Minutes of Exercise per Session: 0 min  Stress: Stress Concern Present (06/12/2024)   [employer] of Occupational Health - Occupational Stress Questionnaire    Feeling of Stress: To some extent  Social Connections: Moderately Isolated (06/12/2024)   Social Connection and Isolation Panel    Frequency of Communication with Friends and Family: More than three times a week    Frequency of Social Gatherings with Friends and Family: Once a week    Attends Religious Services: More than 4 times per year    Active Member of [employer] or Organizations: No    Attends Banker Meetings: Never    Marital Status: Widowed  Catering Manager Violence: Not on file    Review of Systems:    Constitutional: No weight loss, fever or chills Skin: No rash or itching Cardiovascular: No chest pain Respiratory: No SOB  Gastrointestinal: See HPI and otherwise negative Genitourinary: No dysuria  Neurological: No headache, dizziness or syncope Musculoskeletal: No new muscle or joint pain Hematologic: No bleeding Psychiatric: No history of depression or anxiety    Physical Exam:  Vital signs: BP 122/64   Pulse 74  Ht 5' 2 (1.575 m)   Wt 155 lb  (70.3 kg)   BMI 28.35 kg/m    Constitutional:   Pleasant elderly Caucasian female appears to be in NAD, Well developed, Well nourished, alert and cooperative Head:  Normocephalic and atraumatic. Eyes:   PEERL, EOMI. No icterus. Conjunctiva pink. Ears:  Normal auditory acuity. Neck:  Supple Throat: Oral cavity and pharynx without inflammation, swelling or lesion.  Respiratory: Respirations even and unlabored. Lungs clear to auscultation bilaterally.   No wheezes, crackles, or rhonchi.  Cardiovascular: Normal S1, S2. No MRG. Regular rate and rhythm. No peripheral edema, cyanosis or pallor.  Gastrointestinal:  Soft, nondistended, nontender. No rebound or guarding. Normal bowel sounds. No appreciable masses or hepatomegaly.+ Midline surgical scar Rectal:  Not performed.  Msk:  Symmetrical without gross deformities. Without edema, no deformity or joint abnormality.  Neurologic:  Alert and  oriented x4;  grossly normal neurologically.  Skin:   Dry and intact without significant lesions or rashes. Psychiatric: Demonstrates good judgement and reason without abnormal affect or behaviors.  RELEVANT LABS AND IMAGING: CBC    Component Value Date/Time   WBC 10.7 05/24/2020 1414   RBC 4.53 05/24/2020 1414   HGB 12.8 05/24/2020 1414   HCT 39.6 05/24/2020 1414   PLT 406 05/24/2020 1414   MCV 87 05/24/2020 1414   MCH 28.3 05/24/2020 1414   MCHC 32.3 05/24/2020 1414   RDW 13.1 05/24/2020 1414   LYMPHSABS 2.8 05/24/2020 1414   EOSABS 0.2 05/24/2020 1414   BASOSABS 0.1 05/24/2020 1414    CMP     Component Value Date/Time   NA 139 06/12/2024 1449   K 5.4 (H) 06/12/2024 1449   CL 102 06/12/2024 1449   CO2 23 06/12/2024 1449   GLUCOSE 89 06/12/2024 1449   GLUCOSE 85 02/29/2016 1635   BUN 16 06/12/2024 1449   CREATININE 0.68 06/12/2024 1449   CALCIUM  9.7 06/12/2024 1449   PROT 6.5 06/12/2024 1449   ALBUMIN 3.9 06/12/2024 1449   AST 16 06/12/2024 1449   ALT 17 06/12/2024 1449   ALKPHOS  183 (H) 06/12/2024 1449   BILITOT 0.2 06/12/2024 1449   GFRNONAA 93 05/24/2020 1414   GFRAA 108 05/24/2020 1414    Assessment: 1.  Change in bowel habits: Patient had prior sigmoidectomy in 2020 given severe diverticular disease, colonoscopy prior to that unable to be completed due to stricturing, was recommended to have repeat but does not want to have one, recently noticed a change in bowel habits, small rabbit pebbles and then also occasional episodes of gushing diarrhea; concern for constipation with overflow 2.  History of sigmoidectomy: For diverticular disease in 2020  Plan: 1.  Discussed possible colonoscopy with the patient but she declines.  She really wants to avoid this.  She tells me the bowel prep would be to help her for her if she really has little control after her bowel surgery when she has episodes of diarrhea.  We discussed the possibility of constipation with overflow which seems to match her symptoms.  Ordered a two-view abdominal x-ray today to see her state of stool.  Will discuss findings with her and recommendations accordingly when this results. 2.  Continue fiber supplement. 3.  Patient assigned to Dr. Wilhelmenia today.  Laurie Failing, PA-C [facility] 06/19/2024, 7:59 AM  Cc: Tanda Bleacher, MD

## 2024-06-19 NOTE — Patient Instructions (Addendum)
 Your provider has requested that you have an abdominal x ray before leaving today. Please go to the basement floor to our Radiology department for the test.  _______________________________________________________  If your blood pressure at your visit was 140/90 or greater, please contact your primary care physician to follow up on this.  _______________________________________________________  If you are age 72 or older, your body mass index should be between 23-30. Your Body mass index is 28.35 kg/m. If this is out of the aforementioned range listed, please consider follow up with your Primary Care Provider.  If you are age 66 or younger, your body mass index should be between 19-25. Your Body mass index is 28.35 kg/m. If this is out of the aformentioned range listed, please consider follow up with your Primary Care Provider.   ________________________________________________________  The [facility] providers would like to encourage you to use MYCHART to communicate with providers for non-urgent requests or questions.  Due to long hold times on the telephone, sending your provider a message by [facility] may be a faster and more efficient way to get a response.  Please allow 48 business hours for a response.  Please remember that this is for non-urgent requests.  _______________________________________________________  [facility] is using a team-based approach to care.  Your team is made up of your doctor and two to three APPS. Our APPS (Nurse Practitioners and Physician Assistants) work with your physician to ensure care continuity for you. They are fully qualified to address your health concerns and develop a treatment plan. They communicate directly with your gastroenterologist to care for you. Seeing the Advanced Practice Practitioners on your physician's team can help you by facilitating care more promptly, often allowing for earlier appointments, access to diagnostic testing,  procedures, and other specialty referrals.

## 2024-06-21 NOTE — Progress Notes (Signed)
 Attending Physician's Attestation   I have reviewed the chart.   I agree with the Advanced Practitioner's note, impression, and recommendations with any updates as below. Agree that ideally patient undergo colonoscopy for her change in bowel habits and her significant history and she had previous polyp that was never followed up.  We cannot force patients.  X-ray imaging reasonable to see if overflow is occurring.  She could have anastomotic stricturing is another reason for changes in bowel habits or if something has developed in regards to the polyp that was never removed.  In any case, if x-ray imaging not helpful, consider cross-sectional CTAP with IV and oral contrast if patient remains hesitant about colonoscopy.   Aloha Finner, MD [facility] Advanced Endoscopy Office # [phone]

## 2024-06-25 ENCOUNTER — Ambulatory Visit: Payer: Self-pay | Admitting: Family Medicine

## 2024-06-25 ENCOUNTER — Ambulatory Visit: Payer: Self-pay | Admitting: Physician Assistant

## 2024-06-25 NOTE — Progress Notes (Signed)
 Please let patient know that she did have a moderate amount of stool in her colon.  I would like her to do a bowel purge.  Then add MiraLAX daily.  Thanks, JL L

## 2024-06-26 ENCOUNTER — Encounter: Payer: Self-pay | Admitting: *Deleted

## 2024-07-22 NOTE — Telephone Encounter (Signed)
 Please advise

## 2024-07-23 NOTE — Telephone Encounter (Signed)
 Please advise

## 2024-08-27 ENCOUNTER — Telehealth: Payer: Self-pay | Admitting: Physician Assistant

## 2024-08-27 NOTE — Telephone Encounter (Signed)
 PT is requesting to speak with a nurse regarding her recurring syjmptoms. She stated that the previous bowel purge and medication did not help at all and she is still in a lot of discomfort. Please advise.

## 2024-08-28 NOTE — Telephone Encounter (Signed)
 The pt states she has been taking miralax, fiber and completed bowel purge as discussed at last office visit.  She believes  that she  still has constipation with overflow diarrhea.  She would like to know if she can do another bowel purge.  She will complete that today and call back if that does not work.  She was also advised again of the recommendation for colon.  She continues to decline.

## 2024-08-29 ENCOUNTER — Encounter: Payer: Self-pay | Admitting: Gastroenterology

## 2024-09-01 ENCOUNTER — Other Ambulatory Visit: Payer: Self-pay | Admitting: Family Medicine

## 2024-09-01 DIAGNOSIS — E119 Type 2 diabetes mellitus without complications: Secondary | ICD-10-CM

## 2024-09-01 NOTE — Telephone Encounter (Unsigned)
 Copied from CRM #[id number]. Topic: Clinical - Medication Refill >> Sep 01, 2024  2:06 PM Avram MATSU wrote: Medication: metFORMIN  (GLUCOPHAGE ) 1000 MG tablet [[id number]] lisinopril  (ZESTRIL ) 10 MG tablet [[id number]]  Has the patient contacted their pharmacy? Yes (Agent: If no, request that the patient contact the pharmacy for the refill. If patient does not wish to contact the pharmacy document the reason why and proceed with request.) (Agent: If yes, when and what did the pharmacy advise?)  This is the patient's preferred pharmacy:  [facility] [address] ([address] [place] - [address] [place] ([place]) [place] [address] Phone: [phone] Fax: [phone]  Is this the correct pharmacy for this prescription? Yes If no, delete pharmacy and type the correct one.   Has the prescription been filled recently? No  Is the patient out of the medication? Yes  Has the patient been seen for an appointment in the last year OR does the patient have an upcoming appointment? Yes  Can we respond through MyChart? Yes  Agent: Please be advised that Rx refills may take up to 3 business days. We ask that you follow-up with your pharmacy.

## 2024-09-03 MED ORDER — METFORMIN HCL 1000 MG PO TABS: 1000.0000 mg | ORAL_TABLET | Freq: Two times a day (BID) | ORAL | 0 refills | Status: AC

## 2024-09-03 NOTE — Telephone Encounter (Signed)
 Requested medication (s) are due for refill today: yes  Requested medication (s) are on the active medication list: yes  Last refill:  02/27/23 #180  Future visit scheduled: yes in April  Notes to clinic:  overdue lab work    Requested Prescriptions  Pending Prescriptions Disp Refills   metFORMIN  (GLUCOPHAGE ) 1000 MG tablet 180 tablet 0    Sig: Take 1 tablet (1,000 mg total) by mouth 2 (two) times daily with a meal.     Endocrinology:  Diabetes - Biguanides Failed - 09/03/2024  7:35 AM      Failed - B12 Level in normal range and within 720 days    No results found for: VITAMINB12       Failed - CBC within normal limits and completed in the last 12 months    WBC  Date Value Ref Range Status  05/24/2020 10.7 3.4 - 10.8 x10E3/uL Final   RBC  Date Value Ref Range Status  05/24/2020 4.53 3.77 - 5.28 x10E6/uL Final   Hemoglobin  Date Value Ref Range Status  05/24/2020 12.8 11.1 - 15.9 g/dL Final   Hematocrit  Date Value Ref Range Status  05/24/2020 39.6 34.0 - 46.6 % Final   MCHC  Date Value Ref Range Status  05/24/2020 32.3 31.5 - 35.7 g/dL Final   [facility]  Date Value Ref Range Status  05/24/2020 28.3 26.6 - 33.0 pg Final   MCV  Date Value Ref Range Status  05/24/2020 87 79 - 97 fL Final   No results found for: PLTCOUNTKUC, LABPLAT, POCPLA RDW  Date Value Ref Range Status  05/24/2020 13.1 11.7 - 15.4 % Final         Passed - Cr in normal range and within 360 days    Creatinine, Ser  Date Value Ref Range Status  06/12/2024 0.68 0.57 - 1.00 mg/dL Final         Passed - HBA1C is between 0 and 7.9 and within 180 days    HbA1c, POC (controlled diabetic range)  Date Value Ref Range Status  03/12/2024 5.7 0.0 - 7.0 % Final         Passed - eGFR in normal range and within 360 days    GFR calc Af Amer  Date Value Ref Range Status  05/24/2020 108 >59 mL/min/1.73 Final    Comment:    **[facility] currently reports eGFR in compliance with the current**    recommendations of the [employer]. [facility] will   update reporting as new guidelines are published from the NKF-ASN   Task force.    GFR calc non Af Amer  Date Value Ref Range Status  05/24/2020 93 >59 mL/min/1.73 Final   eGFR  Date Value Ref Range Status  06/12/2024 92 >59 mL/min/1.73 Final         Passed - Valid encounter within last 6 months    Recent Outpatient Visits           2 months ago Type 2 diabetes mellitus without complication, without long-term current use of insulin (HCC)   [facility], MD   5 months ago Left hand pain   [facility], [place], MD   1 year ago Type 2 diabetes mellitus without complication, without long-term current use of insulin [facility])   [facility], [place], MD   2 years ago Chronic bilateral low back pain with bilateral sciatica  [facility], [place] J, NP   2 years ago Type 2 diabetes mellitus without complication, without long-term current use of insulin [facility])   [facility] [facility], MD

## 2024-09-04 ENCOUNTER — Telehealth: Payer: Self-pay

## 2024-09-04 ENCOUNTER — Telehealth: Payer: Self-pay | Admitting: Family Medicine

## 2024-09-04 NOTE — Telephone Encounter (Signed)
 Copied from CRM #[id number]. Topic: Clinical - Medication Refill >> Sep 04, 2024  4:28 PM Antwanette L wrote: Medication: lisinopril  (ZESTRIL ) 10 MG tablet  Has the patient contacted their pharmacy? Yes   This is the patient's preferred pharmacy:  [facility] [address] ([address]), [place] - [address] [place] ([place]) [place] [address] Phone: [phone] Fax: [phone]    Is this the correct pharmacy for this prescription? Yes    Has the prescription been filled recently? No. Last refill was 04/13/23  Is the patient out of the medication? No. Patient has enough medication til Sunday  Has the patient been seen for an appointment in the last year OR does the patient have an upcoming appointment? Yes. Last office visit with Dr. Tanda was on 06/12/24. Next appt is 12/11/24  Can we respond through MyChart? No. Patient can be reached at [phone]  Agent: Please be advised that Rx refills may take up to 3 business days. We ask that you follow-up with your pharmacy. >> Sep 04, 2024  4:31 PM Antwanette L wrote: The pt said the current dosage for her  lisinopril  is 40mg  but the pt chart reflects 10 mg

## 2024-09-04 NOTE — Telephone Encounter (Signed)
 RN called to confirm and patient would like to have colonoscopy on 09/24/2024 with Dr. Wilhelmenia.  Please see OV encounter on 06/19/2024. Please advise what prep patient needs due to urgent diarrhea.  Thank you!

## 2024-09-05 ENCOUNTER — Telehealth: Payer: Self-pay

## 2024-09-05 ENCOUNTER — Other Ambulatory Visit: Payer: Self-pay | Admitting: *Deleted

## 2024-09-05 DIAGNOSIS — E119 Type 2 diabetes mellitus without complications: Secondary | ICD-10-CM

## 2024-09-05 DIAGNOSIS — I10 Essential (primary) hypertension: Secondary | ICD-10-CM

## 2024-09-05 MED ORDER — LISINOPRIL 10 MG PO TABS: 10.0000 mg | ORAL_TABLET | Freq: Every day | ORAL | 0 refills | Status: AC

## 2024-09-05 NOTE — Telephone Encounter (Signed)
 Please provide typical prep for patient.

## 2024-09-05 NOTE — Telephone Encounter (Signed)
 Refill sent in

## 2024-09-05 NOTE — Telephone Encounter (Signed)
 RN called to inform patient she will be doing Miralax prep for upcoming colonoscopy with Dr. Wilhelmenia. Pt verbalzies understanding.  Informed patient we will see her on 09/10/2024 for her in-person pre-visit at 4:00pm rm 50. Patient verbalizes understanding.

## 2024-09-09 ENCOUNTER — Telehealth: Payer: Self-pay

## 2024-09-09 NOTE — Telephone Encounter (Signed)
 Due to weather conditions, per pt request, In person pre-visit rescheduled for 09/17/2024 at 4:00 PM. Patient verbalizes understanding.

## 2024-09-10 ENCOUNTER — Encounter

## 2024-09-16 ENCOUNTER — Telehealth: Payer: Self-pay

## 2024-09-16 NOTE — Telephone Encounter (Signed)
 Patient called wanting to reschedule her in-person PV as well as her colonoscopy with Dr. Bernette due to the weather conditions.  Patient states she is unable to get out of her driveway and would like to reschedule both appointments.   RN rescheduled her in-person PV for 10/15/2024 at 4:00PM-RM 50 and colonoscopy with Dr. Agatha on 11/05/2024 at 3:00PM. Patient verbalizes understanding and all questions answered.

## 2024-09-17 ENCOUNTER — Encounter

## 2024-09-17 ENCOUNTER — Other Ambulatory Visit: Payer: Self-pay | Admitting: Family Medicine

## 2024-09-17 DIAGNOSIS — E039 Hypothyroidism, unspecified: Secondary | ICD-10-CM

## 2024-09-17 NOTE — Telephone Encounter (Unsigned)
 Copied from CRM [id number]. Topic: Clinical - Medication Refill >> Sep 17, 2024  4:15 PM Fonda T wrote: Medication: levothyroxine  (SYNTHROID ) 50 MCG tablet  Pt reports dose was changed by provider, but never received   Has the patient contacted their pharmacy? Yes, advised to contact office   This is the patient's preferred pharmacy:  [facility] [address] ([address]), [place] - [address] [place] ([place]) [place] [address] Phone: [phone] Fax: [phone]   Is this the correct pharmacy for this prescription? Yes If no, delete pharmacy and type the correct one.   Has the prescription been filled recently? Yes  Is the patient out of the medication? No, currently have 2 pills left  Has the patient been seen for an appointment in the last year OR does the patient have an upcoming appointment? Yes  Can we respond through MyChart? No, pt prefers phone call at [phone]   Agent: Please be advised that Rx refills may take up to 3 business days. We ask that you follow-up with your pharmacy.

## 2024-09-19 NOTE — Telephone Encounter (Signed)
 Requested medication (s) are due for refill today: -  Requested medication (s) are on the active medication list: hx med  Last refill:  04/27/22  Future visit scheduled: yes  Notes to clinic:  hx provider   Requested Prescriptions  Pending Prescriptions Disp Refills   levothyroxine  (SYNTHROID ) 50 MCG tablet       Endocrinology:  Hypothyroid Agents Failed - 09/19/2024 11:59 AM      Failed - TSH in normal range and within 360 days    TSH  Date Value Ref Range Status  06/12/2024 0.404 (L) 0.450 - 4.500 uIU/mL Final         Passed - Valid encounter within last 12 months    Recent Outpatient Visits           3 months ago Type 2 diabetes mellitus without complication, without long-term current use of insulin (HCC)   [facility] [facility], MD   6 months ago Left hand pain   [place] [facility], [place], MD   1 year ago Type 2 diabetes mellitus without complication, without long-term current use of insulin [facility])   [place] [facility], [place], MD   2 years ago Chronic bilateral low back pain with bilateral sciatica   [place] [facility], Amy J, NP   2 years ago Type 2 diabetes mellitus without complication, without long-term current use of insulin [facility])   [place] [facility], MD

## 2024-09-24 ENCOUNTER — Encounter: Admitting: Gastroenterology

## 2024-10-08 ENCOUNTER — Ambulatory Visit: Payer: Self-pay | Admitting: Family

## 2024-10-15 ENCOUNTER — Encounter

## 2024-11-05 ENCOUNTER — Encounter: Admitting: Gastroenterology

## 2024-12-11 ENCOUNTER — Ambulatory Visit: Admitting: Family Medicine
